# Patient Record
Sex: Female | Born: 1975
Health system: Southern US, Community
[De-identification: ages and names within clinical notes are randomized; demographics above are authoritative.]

## PROBLEM LIST (undated history)

## (undated) DIAGNOSIS — I10 Essential (primary) hypertension: Secondary | ICD-10-CM

## (undated) DIAGNOSIS — N83201 Unspecified ovarian cyst, right side: Secondary | ICD-10-CM

## (undated) DIAGNOSIS — R7989 Other specified abnormal findings of blood chemistry: Secondary | ICD-10-CM

## (undated) DIAGNOSIS — F32A Depression, unspecified: Secondary | ICD-10-CM

## (undated) DIAGNOSIS — F329 Major depressive disorder, single episode, unspecified: Secondary | ICD-10-CM

## (undated) DIAGNOSIS — K219 Gastro-esophageal reflux disease without esophagitis: Secondary | ICD-10-CM

## (undated) DIAGNOSIS — J40 Bronchitis, not specified as acute or chronic: Secondary | ICD-10-CM

## (undated) DIAGNOSIS — J45909 Unspecified asthma, uncomplicated: Secondary | ICD-10-CM

## (undated) DIAGNOSIS — N390 Urinary tract infection, site not specified: Secondary | ICD-10-CM

## (undated) HISTORY — PX: MENISCUS REPAIR: SHX5179

## (undated) HISTORY — DX: Urinary tract infection, site not specified: N39.0

## (undated) HISTORY — DX: Essential (primary) hypertension: I10

## (undated) HISTORY — DX: Major depressive disorder, single episode, unspecified: F32.9

## (undated) HISTORY — DX: Gastro-esophageal reflux disease without esophagitis: K21.9

## (undated) HISTORY — PX: ANTERIOR CRUCIATE LIGAMENT REPAIR: SHX115

## (undated) HISTORY — PX: ABDOMINAL HYSTERECTOMY: SHX81

## (undated) HISTORY — DX: Depression, unspecified: F32.A

## (undated) HISTORY — DX: Unspecified asthma, uncomplicated: J45.909

## (undated) HISTORY — DX: Other specified abnormal findings of blood chemistry: R79.89

## (undated) HISTORY — DX: Bronchitis, not specified as acute or chronic: J40

## (undated) HISTORY — DX: Unspecified ovarian cyst, right side: N83.201

---

## 1999-05-12 ENCOUNTER — Encounter: Payer: Self-pay | Admitting: *Deleted

## 1999-05-12 ENCOUNTER — Emergency Department (HOSPITAL_COMMUNITY): Admission: EM | Admit: 1999-05-12 | Discharge: 1999-05-12 | Payer: Self-pay | Admitting: *Deleted

## 2003-01-07 ENCOUNTER — Other Ambulatory Visit: Admission: RE | Admit: 2003-01-07 | Discharge: 2003-01-07 | Payer: Self-pay | Admitting: Obstetrics and Gynecology

## 2004-02-13 ENCOUNTER — Other Ambulatory Visit: Admission: RE | Admit: 2004-02-13 | Discharge: 2004-02-13 | Payer: Self-pay | Admitting: Obstetrics and Gynecology

## 2005-02-16 ENCOUNTER — Other Ambulatory Visit: Admission: RE | Admit: 2005-02-16 | Discharge: 2005-02-16 | Payer: Self-pay | Admitting: Obstetrics and Gynecology

## 2005-06-22 ENCOUNTER — Emergency Department (HOSPITAL_COMMUNITY): Admission: EM | Admit: 2005-06-22 | Discharge: 2005-06-22 | Payer: Self-pay | Admitting: Emergency Medicine

## 2005-08-07 ENCOUNTER — Emergency Department (HOSPITAL_COMMUNITY): Admission: EM | Admit: 2005-08-07 | Discharge: 2005-08-07 | Payer: Self-pay | Admitting: Emergency Medicine

## 2007-06-17 ENCOUNTER — Emergency Department (HOSPITAL_COMMUNITY): Admission: EM | Admit: 2007-06-17 | Discharge: 2007-06-18 | Payer: Self-pay | Admitting: Emergency Medicine

## 2011-08-12 LAB — URINE MICROSCOPIC-ADD ON

## 2011-08-12 LAB — URINE CULTURE: Colony Count: >=100000

## 2011-08-12 LAB — URINALYSIS, ROUTINE W REFLEX MICROSCOPIC
Bilirubin Urine: NEGATIVE
Glucose, UA: NEGATIVE
Ketones, ur: NEGATIVE
Nitrite: POSITIVE — AB
Protein, ur: 30 — AB
Specific Gravity, Urine: 1.014
Urobilinogen, UA: 2 — ABNORMAL HIGH
pH: 7

## 2011-08-12 LAB — PREGNANCY, URINE: Preg Test, Ur: NEGATIVE

## 2013-03-08 ENCOUNTER — Other Ambulatory Visit: Payer: Self-pay | Admitting: *Deleted

## 2013-03-08 ENCOUNTER — Encounter: Payer: Self-pay | Admitting: Gynecology

## 2013-03-08 ENCOUNTER — Ambulatory Visit (INDEPENDENT_AMBULATORY_CARE_PROVIDER_SITE_OTHER): Payer: BC Managed Care – PPO | Admitting: Gynecology

## 2013-03-08 VITALS — BP 110/74 | Ht 64.0 in | Wt 190.0 lb

## 2013-03-08 DIAGNOSIS — Z113 Encounter for screening for infections with a predominantly sexual mode of transmission: Secondary | ICD-10-CM

## 2013-03-08 DIAGNOSIS — N852 Hypertrophy of uterus: Secondary | ICD-10-CM

## 2013-03-08 DIAGNOSIS — Z0000 Encounter for general adult medical examination without abnormal findings: Secondary | ICD-10-CM

## 2013-03-08 DIAGNOSIS — E669 Obesity, unspecified: Secondary | ICD-10-CM | POA: Insufficient documentation

## 2013-03-08 DIAGNOSIS — IMO0001 Reserved for inherently not codable concepts without codable children: Secondary | ICD-10-CM

## 2013-03-08 DIAGNOSIS — Z124 Encounter for screening for malignant neoplasm of cervix: Secondary | ICD-10-CM

## 2013-03-08 DIAGNOSIS — Z309 Encounter for contraceptive management, unspecified: Secondary | ICD-10-CM

## 2013-03-08 DIAGNOSIS — Z01419 Encounter for gynecological examination (general) (routine) without abnormal findings: Secondary | ICD-10-CM

## 2013-03-08 LAB — POCT URINALYSIS DIPSTICK: pH, UA: 5

## 2013-03-08 NOTE — Patient Instructions (Signed)
High Protein Diet A high protein diet means that high protein foods are added to your diet. Getting more protein in the diet is important for a number of reasons. Protein helps the body to build tissue, muscle, and to repair damage. People who have had surgery, injuries such as broken bones, infections, and burns, or illnesses such as cancer, may need more protein in their diet.  SERVING SIZES Measuring foods and serving sizes helps to make sure you are getting the right amount of food. The list below tells how big or small some common serving sizes are.   1 oz.........4 stacked dice.  3 oz........Marland KitchenDeck of cards.  1 tsp.......Marland KitchenTip of little finger.  1 tbs......Marland KitchenMarland KitchenThumb.  2 tbs.......Marland KitchenGolf ball.   cup......Marland KitchenHalf of a fist.  1 cup.......Marland KitchenA fist. FOOD SOURCES OF PROTEIN Listed below are some food sources of protein and the amount of protein they contain. Your Registered Dietitian can calculate how many grams of protein you need for your medical condition. High protein foods can be added to the diet at mealtime or as snacks. Be sure to have at least 1 protein-containing food at each meal and snack to ensure adequate intake.   Levonorgestrel intrauterine device (IUD) What is this medicine? LEVONORGESTREL IUD (LEE voe nor jes trel) is a contraceptive (birth control) device. It is used to prevent pregnancy and to treat heavy bleeding that occurs during your period. It can be used for up to 5 years. This medicine may be used for other purposes; ask your health care provider or pharmacist if you have questions. What should I tell my health care provider before I take this medicine? They need to know if you have any of these conditions: -abnormal Pap smear -cancer of the breast, uterus, or cervix -diabetes -endometritis -genital or pelvic infection now or in the past -have more than one sexual partner or your partner has more than one partner -heart disease -history of an ectopic or tubal  pregnancy -immune system problems -IUD in place -liver disease or tumor -problems with blood clots or take blood-thinners -use intravenous drugs -uterus of unusual shape -vaginal bleeding that has not been explained -an unusual or allergic reaction to levonorgestrel, other hormones, silicone, or polyethylene, medicines, foods, dyes, or preservatives -pregnant or trying to get pregnant -breast-feeding How should I use this medicine? This device is placed inside the uterus by a health care professional. Talk to your pediatrician regarding the use of this medicine in children. Special care may be needed. Overdosage: If you think you have taken too much of this medicine contact a poison control center or emergency room at once. NOTE: This medicine is only for you. Do not share this medicine with others. What if I miss a dose? This does not apply. What may interact with this medicine? Do not take this medicine with any of the following medications: -amprenavir -bosentan -fosamprenavir This medicine may also interact with the following medications: -aprepitant -barbiturate medicines for inducing sleep or treating seizures -bexarotene -griseofulvin -medicines to treat seizures like carbamazepine, ethotoin, felbamate, oxcarbazepine, phenytoin, topiramate -modafinil -pioglitazone -rifabutin -rifampin -rifapentine -some medicines to treat HIV infection like atazanavir, indinavir, lopinavir, nelfinavir, tipranavir, ritonavir -St. John's wort -warfarin This list may not describe all possible interactions. Give your health care provider a list of all the medicines, herbs, non-prescription drugs, or dietary supplements you use. Also tell them if you smoke, drink alcohol, or use illegal drugs. Some items may interact with your medicine. What should I watch for while using this  medicine? Visit your doctor or health care professional for regular check ups. See your doctor if you or your partner  has sexual contact with others, becomes HIV positive, or gets a sexual transmitted disease. This product does not protect you against HIV infection (AIDS) or other sexually transmitted diseases. You can check the placement of the IUD yourself by reaching up to the top of your vagina with clean fingers to feel the threads. Do not pull on the threads. It is a good habit to check placement after each menstrual period. Call your doctor right away if you feel more of the IUD than just the threads or if you cannot feel the threads at all. The IUD may come out by itself. You may become pregnant if the device comes out. If you notice that the IUD has come out use a backup birth control method like condoms and call your health care provider. Using tampons will not change the position of the IUD and are okay to use during your period. What side effects may I notice from receiving this medicine? Side effects that you should report to your doctor or health care professional as soon as possible: -allergic reactions like skin rash, itching or hives, swelling of the face, lips, or tongue -fever, flu-like symptoms -genital sores -high blood pressure -no menstrual period for 6 weeks during use -pain, swelling, warmth in the leg -pelvic pain or tenderness -severe or sudden headache -signs of pregnancy -stomach cramping -sudden shortness of breath -trouble with balance, talking, or walking -unusual vaginal bleeding, discharge -yellowing of the eyes or skin Side effects that usually do not require medical attention (report to your doctor or health care professional if they continue or are bothersome): -acne -breast pain -change in sex drive or performance -changes in weight -cramping, dizziness, or faintness while the device is being inserted -headache -irregular menstrual bleeding within first 3 to 6 months of use -nausea This list may not describe all possible side effects. Call your doctor for medical  advice about side effects. You may report side effects to FDA at 1-800-FDA-1088. Where should I keep my medicine? This does not apply. NOTE: This sheet is a summary. It may not cover all possible information. If you have questions about this medicine, talk to your doctor, pharmacist, or health care provider.  2012, Elsevier/Gold Standard. (11/07/2008 6:39:08 PM) Meats and Meat Substitutes / Protein (g)  3 oz poultry (chicken, [place]) / 26 g  3 oz tuna, canned in water / 26 g  3 oz fish (cod) / 21 g  3 oz red meat (beef, pork) / 21 g  4 oz tofu / 9 g  1 egg / 6 g   cup egg substitute / 5 g  1 cup dried beans / 15 g  1 cup soy milk / 4 g Dairy / Protein (g)  1 cup milk (skim, 1%, 2%, whole) / 8 g   cup evaporated milk / 9 g  1 cup buttermilk / 8 g  1 cup low-fat plain yogurt / 11 g  1 cup regular plain yogurt / 9 g   cup cottage cheese / 14 g  1 oz cheddar cheese / 7 g Nuts / Protein (g)  2 tbs peanut butter / 8 g  1 oz peanuts / 7 g  2 tbs cashews / 5 g  2 tbs almonds / 5 g Document Released: 10/17/2005 Document Revised: 01/09/2012 Document Reviewed: 07/20/2007 [facility] Patient Information 2013 [place], [place].

## 2013-03-08 NOTE — Progress Notes (Signed)
37 y.o.  Single  African American female  G0. here for annual exam.  Current partner for 3y, mostly condom use, missed a cycle in March but resumed in April,  Pt reports negative UPT's.  Pt denies h/o STD, not interested in conception at this time, has used nuvaring, depo and oc without success.  No LMP recorded.          Sexually active: yes  The current method of family planning is condoms.    Exercising:  Starting to now  Last mammogram:  none Last pap smear: 3 years ago History of abnormal pap: none Smoking: no  Alcohol: 1-2 drinks/wk Last colonoscopy: none Last Bone Density:  none Last tetanus shot: 03/06/13 Last cholesterol check: not recently BSE:  yes  Hgb: 11.8               Urine: leuks 2    No health maintenance topics applied.  No family history on file.  There are no active problems to display for this patient.   No past medical history on file.  No past surgical history on file.  Allergies: Review of patient's allergies indicates not on file.  No current outpatient prescriptions on file.   No current facility-administered medications for this visit.    ROS: Pertinent items are noted in HPI.  Social Hx:    Exam:    There were no vitals taken for this visit.   Wt Readings from Last 3 Encounters:  No data found for Wt     Ht Readings from Last 3 Encounters:  No data found for Ht    General appearance: alert, cooperative and appears stated age Head: Normocephalic, without obvious abnormality, atraumatic Neck: no adenopathy, supple, symmetrical, trachea midline and thyroid not enlarged, symmetric, no tenderness/mass/nodules Lungs: clear to auscultation bilaterally Breasts: Inspection negative, No nipple retraction or dimpling, No nipple discharge or bleeding, No axillary or supraclavicular adenopathy, Normal to palpation without dominant masses Heart: regular rate and rhythm Abdomen: soft, non-tender; bowel sounds normal; no masses,  no  organomegaly Extremities: extremities normal, atraumatic, no cyanosis or edema Skin: Skin color, texture, turgor normal. No rashes or lesions Lymph nodes: Cervical, supraclavicular, and axillary nodes normal. No abnormal inguinal nodes palpated Neurologic: Grossly normal   Pelvic: External genitalia:  no lesions              Urethra:  normal appearing urethra with no masses, tenderness or lesions              Bartholins and Skenes: normal                 Vagina: normal appearing vagina with normal color and discharge, no lesions              Cervix: normal appearance              Pap taken: yes        Bimanual Exam:  Uterus:  uterus is nontender, anteflexed or anterior fibroid                                      Adnexa: normal adnexa in size, nontender and no masses                                      Rectovaginal: Confirms  Anus:  normal sphincter tone, no lesions  A: normal gyn exam Contraceptive management Obesity Questionable fibroid     P: PAP with HR HPV with GC/CTM, lipid panel counseled on breast self exam, diet and exercise U/s to evaluate uterus return annually or prn     An After Visit Summary was printed and given to the patient.

## 2013-03-09 LAB — LIPID PANEL
Cholesterol: 175 mg/dL (ref 0–200)
HDL: 60 mg/dL
LDL Cholesterol: 100 mg/dL — ABNORMAL HIGH (ref 0–99)
Total CHOL/HDL Ratio: 2.9 ratio
Triglycerides: 74 mg/dL
VLDL: 15 mg/dL (ref 0–40)

## 2013-03-11 ENCOUNTER — Telehealth: Payer: Self-pay | Admitting: *Deleted

## 2013-03-11 NOTE — Telephone Encounter (Signed)
Patient returning call to [facility]

## 2013-03-11 NOTE — Telephone Encounter (Signed)
Left Message To Call Back  

## 2013-03-11 NOTE — Telephone Encounter (Signed)
Message copied by Lorraine Lax on Mon Mar 11, 2013 11:11 AM ------      Message from: Douglass Rivers      Created: Mon Mar 11, 2013  8:10 AM       No UTI, chol ok ------

## 2013-03-11 NOTE — Telephone Encounter (Signed)
PATIETN NOTIFIED OF LAB RESULTS OF URINE AND CHOLESTEROL. PATIENT NOTIFIED THAT AFTER HER INSURANCE HAS BEEN NOTIFIED OF PRE CERT FOR ULTRA SOUND SHE WILL BE NOTIFIED OF OOP EXPENSE THEN ULTRA SOUND WILL BE SCHEDULED.

## 2013-03-12 ENCOUNTER — Telehealth: Payer: Self-pay | Admitting: Gynecology

## 2013-03-12 LAB — IPS PAP TEST WITH HPV

## 2013-03-12 NOTE — Telephone Encounter (Signed)
Noted  

## 2013-03-12 NOTE — Telephone Encounter (Signed)
LMTCB to discuss insurance benefits and to schedule PUS.  °

## 2013-03-26 ENCOUNTER — Ambulatory Visit (INDEPENDENT_AMBULATORY_CARE_PROVIDER_SITE_OTHER): Payer: BC Managed Care – PPO | Admitting: Gynecology

## 2013-03-26 ENCOUNTER — Ambulatory Visit (INDEPENDENT_AMBULATORY_CARE_PROVIDER_SITE_OTHER): Payer: BC Managed Care – PPO

## 2013-03-26 DIAGNOSIS — N852 Hypertrophy of uterus: Secondary | ICD-10-CM

## 2013-03-26 DIAGNOSIS — N8300 Follicular cyst of ovary, unspecified side: Secondary | ICD-10-CM

## 2013-03-26 DIAGNOSIS — N7013 Chronic salpingitis and oophoritis: Secondary | ICD-10-CM

## 2013-03-26 DIAGNOSIS — N7011 Chronic salpingitis: Secondary | ICD-10-CM

## 2013-03-26 DIAGNOSIS — N946 Dysmenorrhea, unspecified: Secondary | ICD-10-CM

## 2013-03-26 DIAGNOSIS — N731 Chronic parametritis and pelvic cellulitis: Secondary | ICD-10-CM

## 2013-03-26 DIAGNOSIS — N83209 Unspecified ovarian cyst, unspecified side: Secondary | ICD-10-CM

## 2013-03-26 DIAGNOSIS — Z309 Encounter for contraceptive management, unspecified: Secondary | ICD-10-CM

## 2013-03-26 MED ORDER — MISOPROSTOL 200 MCG PO TABS: ORAL_TABLET | ORAL | Status: DC

## 2013-03-26 NOTE — Progress Notes (Signed)
Pt here to evaluate questionable fibroid uterus with right sided tenderness on exam. U/S images reviewed with pt. Pt has questionable hydrosalpinx on right.  Pt denies history of STD or appendicitis.   We reviewed her options-she can do nothing, we can explore and either open the tube and lyse adhesions, or remove the tube.Pt reports discomfort on right is tolerable.  We stressed the increased risk of ectopic on the right, she is not interested in conception and desires having the IUD placed. We will see her back at her next menses, cytotec called in

## 2013-04-01 ENCOUNTER — Telehealth: Payer: Self-pay | Admitting: Gynecology

## 2013-04-01 NOTE — Telephone Encounter (Signed)
LMTCB to schedule during 1st 5 days of cycle.

## 2013-04-08 ENCOUNTER — Other Ambulatory Visit: Payer: Self-pay | Admitting: Orthopedic Surgery

## 2013-04-08 DIAGNOSIS — Z309 Encounter for contraceptive management, unspecified: Secondary | ICD-10-CM

## 2013-04-08 MED ORDER — MISOPROSTOL 200 MCG PO TABS: ORAL_TABLET | ORAL | Status: DC

## 2013-04-08 NOTE — Telephone Encounter (Signed)
Patient is ready to schedule iud appointment

## 2013-04-08 NOTE — Telephone Encounter (Signed)
Spoke with pt who started cycle yesterday. Sched IUD insertion 04-11-13 with TL. Instructed on Cytotec the night before and the morning of, and instructed to take ibuprofen 800 mg with food 1 hr prior to appt. Pt agreeable.

## 2013-04-11 ENCOUNTER — Ambulatory Visit (INDEPENDENT_AMBULATORY_CARE_PROVIDER_SITE_OTHER): Payer: BC Managed Care – PPO | Admitting: Gynecology

## 2013-04-11 ENCOUNTER — Encounter: Payer: Self-pay | Admitting: Gynecology

## 2013-04-11 VITALS — BP 112/68 | HR 72 | Resp 14 | Ht 64.0 in | Wt 190.0 lb

## 2013-04-11 DIAGNOSIS — Z992 Dependence on renal dialysis: Secondary | ICD-10-CM

## 2013-04-11 DIAGNOSIS — Z975 Presence of (intrauterine) contraceptive device: Secondary | ICD-10-CM

## 2013-04-11 HISTORY — PX: INTRAUTERINE DEVICE (IUD) INSERTION: SHX5877

## 2013-04-11 NOTE — Patient Instructions (Signed)
Intrauterine Device Insertion Care After Refer to this sheet in the next few weeks. These instructions provide you with information on caring for yourself after your procedure. Your caregiver may also give you more specific instructions. Your treatment has been planned according to current medical practices, but problems sometimes occur. Call your caregiver if you have any problems or questions after your procedure. HOME CARE INSTRUCTIONS   Only take over-the-counter or prescription medicines for pain, discomfort, or fever as directed by your caregiver. Do not use aspirin. This may increase bleeding.  Check your IUD to make sure it is in place before you resume sexual activity. You should be able to feel the strings. If you cannot feel the strings, something may be wrong. The IUD may have fallen out of the uterus, or the uterus may have been punctured (perforated) during placement. Also, if the strings are getting longer, it may mean that the IUD is being forced out of the uterus. You no longer have full protection from pregnancy if any of these problems occur.  You may resume sexual intercourse if you are not having problems with the IUD. The IUD is considered immediately effective.  You may resume normal activities.  Keep all follow-up appointments to be sure your IUD has remained in place. After the first exam, yearly exams are advised, unless you cannot feel the strings of your IUD.  Continue to check that the IUD is still in place by feeling for the strings after every menstrual period. SEEK MEDICAL CARE IF:   You have bleeding that is heavier or lasts longer than a normal menstrual cycle.  You have a fever.  You have increasing cramps or abdominal pain not relieved with medicine.  You have abdominal pain that does not seem to be related to the same area of earlier cramping and pain.  You are lightheaded, unusually weak, or faint.  You have abnormal vaginal discharge or  smells.  You have pain during sexual intercourse.  You cannot feel the IUD strings, or the IUD string has gotten longer.  You feel the IUD at the opening of the cervix in the vagina.  You think you are pregnant, or you miss your menstrual period.  The IUD string is hurting your sex partner. Document Released: 06/15/2011 Document Revised: 01/09/2012 Document Reviewed: 06/15/2011 [employer] Patient Information 2014 [employer].  

## 2013-04-11 NOTE — Procedures (Signed)
.  36 yrsSingleAfrican Americanfemale presents for  insertion of Mirena. Denies any vaginal symptoms or STD concerns.  LMP :04/07/2013  Patient read information regarding IUD insertion.  All questions addressed.    Healthy female,time, place and personnormal menses, no abnormal bleeding, pelvic pain or discharge, no breast pain or new or enlarging lumps on self exam Abdomen: soft, non-tender Groinno inguinal nodes palpated  Pelvic exam: Vulva;normal female genitalia  Vagina:normal vagina  Cervix:Non-tender, Negative CMT, no lesions or redness, nulliparous/parous os  Uterus:normal shape, position and consistency, anteverted    Procedure:  Speculum inserted into vagina. Cervix visualized and cleansed with betadine solution X 3. Tenaculum placed on cervix at 12 o'clock position(s).  Uterus sounded to 7 centimeters.  IUD removed from sterile packet and under sterile conditions inserted to fundus of uterus.  Introducer removed without difficulty.  IUD string trimmed to 2 centimeters.  Remainder string given to patient to feel for identification.  Tenaculum removed.  No bleeding noted.  Speculum removed.  Uterus palpated normal.  Patient tolerated procedure well.  A: Insertion of Mirena, Lot # [id number], Expiration date 12/15   P:  Instructions and warnings signs given.       IUD identification card given with IUD removal 03/2018       Return visit 1 month

## 2013-05-13 ENCOUNTER — Ambulatory Visit: Payer: BC Managed Care – PPO | Admitting: Gynecology

## 2013-05-17 ENCOUNTER — Ambulatory Visit (INDEPENDENT_AMBULATORY_CARE_PROVIDER_SITE_OTHER): Payer: BC Managed Care – PPO | Admitting: Gynecology

## 2013-05-17 VITALS — BP 128/76 | HR 74 | Resp 12 | Ht 64.0 in | Wt 192.2 lb

## 2013-05-17 DIAGNOSIS — Z975 Presence of (intrauterine) contraceptive device: Secondary | ICD-10-CM

## 2013-05-17 DIAGNOSIS — Z30431 Encounter for routine checking of intrauterine contraceptive device: Secondary | ICD-10-CM

## 2013-05-17 NOTE — Progress Notes (Signed)
Subjective:     Patient ID: Courtney Donaldson, female   DOB: [birth year], 37 y.o.   MRN: [id number]  HPI Comments: Pt here for IUD check, reports still bleeding, 2 pads/d, no clots pain is better.  No pain with sex.    Review of Systems  Genitourinary: Positive for vaginal bleeding. Negative for vaginal discharge, vaginal pain and pelvic pain.       Objective:   Physical Exam  Constitutional: She appears well-developed and well-nourished.  Genitourinary: Uterus normal. Uterus is not enlarged and not tender. Cervix exhibits no motion tenderness and no discharge. Right adnexum displays no mass. Left adnexum displays no mass.  normal exterrnal genitalia Light menstrum in vagina, no malodor Stings noted at cervix    Assessment:     IUD check     Plan:     Doing well  F/u prn

## 2013-06-25 ENCOUNTER — Ambulatory Visit (INDEPENDENT_AMBULATORY_CARE_PROVIDER_SITE_OTHER): Payer: BC Managed Care – PPO | Admitting: Gynecology

## 2013-06-25 ENCOUNTER — Encounter: Payer: Self-pay | Admitting: Gynecology

## 2013-06-25 VITALS — BP 116/76 | HR 64 | Resp 12 | Ht 63.0 in | Wt 190.0 lb

## 2013-06-25 DIAGNOSIS — Z113 Encounter for screening for infections with a predominantly sexual mode of transmission: Secondary | ICD-10-CM

## 2013-06-25 NOTE — Patient Instructions (Signed)
  Place sexually transmitted diseases screening patient instructions here.  

## 2013-06-25 NOTE — Progress Notes (Signed)
  Subjective:    Courtney Donaldson is a 37 y.o. female who presents for sexually transmitted disease check. Sexual history reviewed with the patient. STI Exposure: partner cheated with another woman, no condom use. Previous history of STI none. Current symptoms vaginal discharge: bloody and scant, pelvic pain: not present. Contraception: IUD Menstrual History: No LMP recorded.    The following portions of the patient's history were reviewed and updated as appropriate: allergies, current medications, past family history, past medical history, past social history, past surgical history and problem list.  Review of Systems Pertinent items are noted in HPI.    Objective:    BP 116/76  Pulse 64  Resp 12  Ht 5\' 3"  (1.6 m)  Wt 190 lb (86.183 kg)  BMI 33.67 kg/m2 General:   alert, cooperative and appears stated age  Lymph Nodes:   Inguinal adenopathy: negative  Pelvis:  External genitalia: normal general appearance Vaginal: normal mucosa without prolapse or lesions Cervix: iud strings palpated, scant mucoid blood at os Adnexa: normal bimanual exam Uterus: irregular enlargement  Cultures:  GC and Chlamydia genprobes, HIV antibody blood test and HIV, RPR     Assessment:    Possible STD exposure    Plan:    Discussed safe sexual practice in detail See orders for STD cultures and assays Will call pt with results RTC PRN

## 2013-06-26 LAB — STD PANEL
HIV: NONREACTIVE
Hepatitis B Surface Ag: NEGATIVE

## 2013-06-27 ENCOUNTER — Telehealth: Payer: Self-pay | Admitting: Gynecology

## 2013-09-05 ENCOUNTER — Other Ambulatory Visit: Payer: Self-pay

## 2013-10-31 DIAGNOSIS — J40 Bronchitis, not specified as acute or chronic: Secondary | ICD-10-CM

## 2013-10-31 DIAGNOSIS — N83201 Unspecified ovarian cyst, right side: Secondary | ICD-10-CM

## 2013-10-31 HISTORY — DX: Bronchitis, not specified as acute or chronic: J40

## 2013-10-31 HISTORY — DX: Unspecified ovarian cyst, right side: N83.201

## 2014-03-10 ENCOUNTER — Ambulatory Visit: Payer: BC Managed Care – PPO | Admitting: Gynecology

## 2014-03-14 ENCOUNTER — Ambulatory Visit (INDEPENDENT_AMBULATORY_CARE_PROVIDER_SITE_OTHER): Payer: BC Managed Care – PPO | Admitting: Gynecology

## 2014-03-14 ENCOUNTER — Encounter: Payer: Self-pay | Admitting: Gynecology

## 2014-03-14 VITALS — BP 110/80 | HR 68 | Resp 16 | Ht 64.0 in | Wt 201.0 lb

## 2014-03-14 DIAGNOSIS — Z01419 Encounter for gynecological examination (general) (routine) without abnormal findings: Secondary | ICD-10-CM

## 2014-03-14 DIAGNOSIS — Z975 Presence of (intrauterine) contraceptive device: Secondary | ICD-10-CM

## 2014-03-14 DIAGNOSIS — Z0000 Encounter for general adult medical examination without abnormal findings: Secondary | ICD-10-CM

## 2014-03-14 LAB — POCT URINALYSIS DIPSTICK
Bilirubin, UA: NEGATIVE
GLUCOSE UA: NEGATIVE
Ketones, UA: NEGATIVE
Leukocytes, UA: NEGATIVE
NITRITE UA: NEGATIVE
PROTEIN UA: NEGATIVE
RBC UA: NEGATIVE
UROBILINOGEN UA: NEGATIVE
pH, UA: 5

## 2014-03-14 LAB — HEMOGLOBIN, FINGERSTICK: Hemoglobin, fingerstick: 13.2 g/dL (ref 12.0–16.0)

## 2014-03-14 NOTE — Progress Notes (Signed)
38 y.o. Single African American female   G0P0000 here for annual exam. Pt is currently sexually active.  Pt has IUD placed 7/14, cycles are irregular and light, still with a lot of spotting mostly as flow.  No dyspareunia.    Patient's last menstrual period was 03/02/2014.          Sexually active: yes  The current method of family planning is IUD.    Exercising: no  The patient does not participate in regular exercise at present. Last pap: 03/08/13 Neg. HR HPV: Neg Alcohol: Yes, Occasionally  Tobacco: No BSE: yes, once a month. Hem: 13.2 UA: Clear   Health Maintenance  Topic Date Due  . Tetanus/tdap  05/17/1996  . Influenza Vaccine  05/31/2014  . Pap Smear  03/08/2016    Family History  Problem Relation Age of Onset  . Diabetes Mother   . Pancreatic cancer Paternal Aunt 53  . Diabetes Maternal Grandmother   . Diabetes Maternal Grandfather   . Cancer Paternal Grandmother     breast    Patient Active Problem List   Diagnosis Date Noted  . IUD (intrauterine device) in place 05/17/2013  . Morbid obesity 03/08/2013    Past Medical History  Diagnosis Date  . Bronchitis 10/2013    History reviewed. No pertinent past surgical history.  Allergies: Amoxicillin  Current Outpatient Prescriptions  Medication Sig Dispense Refill  . levonorgestrel (MIRENA) 20 MCG/24HR IUD 1 each by Intrauterine route once.       No current facility-administered medications for this visit.    ROS: Pertinent items are noted in HPI.  Exam:    BP 110/80  Pulse 68  Resp 16  Ht 5\' 4"  (1.626 m)  Wt 201 lb (91.173 kg)  BMI 34.48 kg/m2  LMP 03/02/2014 Weight change: @WEIGHTCHANGE @ Last 3 height recordings:  Ht Readings from Last 3 Encounters:  03/14/14 5\' 4"  (1.626 m)  06/25/13 5\' 3"  (1.6 m)  05/17/13 5\' 4"  (1.626 m)   General appearance: alert, cooperative and appears stated age Head: Normocephalic, without obvious abnormality, atraumatic Neck: no adenopathy, no carotid bruit, no JVD,  supple, symmetrical, trachea midline and thyroid not enlarged, symmetric, no tenderness/mass/nodules Lungs: clear to auscultation bilaterally Breasts: normal appearance, no masses or tenderness Heart: regular rate and rhythm, S1, S2 normal, no murmur, click, rub or gallop Abdomen: soft, non-tender; bowel sounds normal; no masses,  no organomegaly Extremities: extremities normal, atraumatic, no cyanosis or edema Skin: Skin color, texture, turgor normal. No rashes or lesions Lymph nodes: Cervical, supraclavicular, and axillary nodes normal. no inguinal nodes palpated Neurologic: Grossly normal   Pelvic: External genitalia:  no lesions              Urethra: normal appearing urethra with no masses, tenderness or lesions              Bartholins and Skenes: Bartholin's, Urethra, Skene's normal                 Vagina: normal appearing vagina with normal color and discharge, no lesions              Cervix: normal appearance and IUD strings no seen or palpated              Pap taken: no        Bimanual Exam:  Uterus:  uterus is normal size, shape, consistency and nontender  Adnexa:    normal adnexa in size, nontender and no masses                                      Rectovaginal: Confirms                                      Anus:  normal sphincter tone, no lesions  A: well woman IUD in place-strings not seen Weight gain-10#     P: counseled on breast self exam, adequate intake of calcium and vitamin D, diet and exercise PUS to confirm IUD placement return annually or prn   An After Visit Summary was printed and given to the patient.

## 2014-03-18 ENCOUNTER — Telehealth: Payer: Self-pay | Admitting: Gynecology

## 2014-03-18 NOTE — Telephone Encounter (Signed)
Spoke with patient. Advised that per benefit quote received, she will be responsible for $5 copay for PUS. Patient agreeable. Scheduled PUS. Advised patient of 72 hour cancellation policy and $354 cancellation fee. Patient agreeable.

## 2014-03-25 ENCOUNTER — Ambulatory Visit (INDEPENDENT_AMBULATORY_CARE_PROVIDER_SITE_OTHER): Payer: BC Managed Care – PPO

## 2014-03-25 ENCOUNTER — Ambulatory Visit (INDEPENDENT_AMBULATORY_CARE_PROVIDER_SITE_OTHER): Payer: BC Managed Care – PPO | Admitting: Gynecology

## 2014-03-25 VITALS — BP 108/70 | HR 68 | Resp 16 | Ht 64.0 in | Wt 199.0 lb

## 2014-03-25 DIAGNOSIS — N83209 Unspecified ovarian cyst, unspecified side: Secondary | ICD-10-CM

## 2014-03-25 DIAGNOSIS — N83202 Unspecified ovarian cyst, left side: Secondary | ICD-10-CM

## 2014-03-25 DIAGNOSIS — Z975 Presence of (intrauterine) contraceptive device: Secondary | ICD-10-CM

## 2014-03-25 NOTE — Progress Notes (Signed)
      Pt here for PUS for location of IUD, strings not seen. Images reviewed with Pt. IUD properly placed in cavity. Right adnexa with calcification (new) noted and serpentine cyst seen previously. Left adnexa: thin walled avascular cyst No free fluid   Pt assured re IUD. Images compared with last year, calcification not noted, ddx dermoid vs clot, will repeat PUS in 1y if no symptoms.  Briefly reviewed dermoid cysts Left ovarian cyst-new, probably functional questions addressed 44m spent counseling>50% face to face

## 2015-03-16 ENCOUNTER — Ambulatory Visit: Payer: BC Managed Care – PPO | Admitting: Gynecology

## 2015-03-17 ENCOUNTER — Ambulatory Visit: Payer: BC Managed Care – PPO | Admitting: Nurse Practitioner

## 2016-02-22 ENCOUNTER — Encounter: Payer: Self-pay | Admitting: Nurse Practitioner

## 2016-02-22 ENCOUNTER — Ambulatory Visit (INDEPENDENT_AMBULATORY_CARE_PROVIDER_SITE_OTHER): Payer: BLUE CROSS/BLUE SHIELD | Admitting: Nurse Practitioner

## 2016-02-22 VITALS — BP 128/80 | HR 60 | Resp 18 | Ht 63.5 in | Wt 194.0 lb

## 2016-02-22 DIAGNOSIS — Z0000 Encounter for general adult medical examination without abnormal findings: Secondary | ICD-10-CM

## 2016-02-22 DIAGNOSIS — T8389XA Other specified complication of genitourinary prosthetic devices, implants and grafts, initial encounter: Secondary | ICD-10-CM | POA: Diagnosis not present

## 2016-02-22 DIAGNOSIS — Z01419 Encounter for gynecological examination (general) (routine) without abnormal findings: Secondary | ICD-10-CM | POA: Diagnosis not present

## 2016-02-22 DIAGNOSIS — N920 Excessive and frequent menstruation with regular cycle: Secondary | ICD-10-CM | POA: Diagnosis not present

## 2016-02-22 LAB — POCT URINALYSIS DIPSTICK
BILIRUBIN UA: NEGATIVE
Blood, UA: NEGATIVE
Glucose, UA: NEGATIVE
Ketones, UA: NEGATIVE
LEUKOCYTES UA: NEGATIVE
NITRITE UA: NEGATIVE
PH UA: 5
Protein, UA: NEGATIVE
Urobilinogen, UA: NEGATIVE

## 2016-02-22 NOTE — Patient Instructions (Addendum)

## 2016-02-22 NOTE — Progress Notes (Signed)
39 y.o. G0P0000 Single African American Fe here for annual exam.  She has had Mirena IUD since 04/11/13.  Since then still has menses.  Overall lighter but still heavier for first 2-3 days, then moderate then light and spotting.  Now cycles last 14+ days.  Prior to IUD heavy and lasted for 7 days.  Last  PUS 03/26/13.  No fibroids seen but ? Hydro salpinx on the right.  Dating same partner for 6 yrs.  She works now for [employer].  Patient's last menstrual period was 02/08/2016.          Sexually active: Yes.    The current method of family planning is IUD Mirena inserted 04/11/13  Exercising: No.  The patient does not participate in regular exercise at present. Smoker:  no  Health Maintenance: Pap:  03/08/13 Neg. HR HPV:neg TDaP:  UTD per pt HIV: 05/2013 Neg Labs: here today UA: Negative   reports that she has never smoked. She has never used smokeless tobacco. She reports that she drinks about 0.5 - 1.0 oz of alcohol per week. She reports that she does not use illicit drugs.  Past Medical History  Diagnosis Date  . Bronchitis 10/2013    Past Surgical History  Procedure Laterality Date  . Intrauterine device (iud) insertion  04/11/13    Mirena    Current Outpatient Prescriptions  Medication Sig Dispense Refill  . levonorgestrel (MIRENA) 20 MCG/24HR IUD 1 each by Intrauterine route once. Inserted 04/11/13     No current facility-administered medications for this visit.    Family History  Problem Relation Age of Onset  . Diabetes Mother   . Pancreatic cancer Paternal Aunt 55  . Diabetes Maternal Grandmother   . Diabetes Maternal Grandfather   . Breast cancer Paternal Grandmother 38    postmenopausal  . Hypertension Father   . Kidney failure  55    ROS:  Pertinent items are noted in HPI.  Otherwise, a comprehensive ROS was negative.  Exam:   BP 128/80 mmHg  Pulse 60  Resp 18  Ht 5' 3.5" (1.613 m)  Wt 194 lb (87.998 kg)  BMI 33.82 kg/m2  LMP 02/08/2016 Height: 5' 3.5" (161.3  cm) Ht Readings from Last 3 Encounters:  02/22/16 5' 3.5" (1.613 m)  03/25/14 5\' 4"  (1.626 m)  03/14/14 5\' 4"  (1.626 m)    General appearance: alert, cooperative and appears stated age Head: Normocephalic, without obvious abnormality, atraumatic Neck: no adenopathy, supple, symmetrical, trachea midline and thyroid normal to inspection and palpation Lungs: clear to auscultation bilaterally Breasts: normal appearance, no masses or tenderness Heart: regular rate and rhythm Abdomen: soft, non-tender; no masses,  no organomegaly Extremities: extremities normal, atraumatic, no cyanosis or edema Skin: Skin color, texture, turgor normal. No rashes or lesions Lymph nodes: Cervical, supraclavicular, and axillary nodes normal. No abnormal inguinal nodes palpated Neurologic: Grossly normal   Pelvic: External genitalia:  no lesions              Urethra:  normal appearing urethra with no masses, tenderness or lesions              Bartholin's and Skene's: normal                 Vagina: normal appearing vagina with normal color and discharge, no lesions              Cervix: anteverted  IUD strings are visible  Pap taken: Yes.   Bimanual Exam:  Uterus:  normal size, contour, position, consistency, mobility, non-tender              Adnexa: no mass, fullness, tenderness               Rectovaginal: Confirms               Anus:  normal sphincter tone, no lesions  Chaperone present: yes  A:  Well Woman with normal exam  History of menorrhagia  ? Right Hydrosalpinx  Mirena IUD for contraception 04/11/2013  Continues to have menses - now at 14 days since IUD  [facility]: DM, kidney failure, Breast cancer and pancreatic cancer   P:   Reviewed health and wellness pertinent to exam  Pap smear as above  Discussed with Dr. Talbert Nan - will have her return for PUS to check status of IUD string and endo lining.  counseled on breast self exam, STD prevention, adequate intake of calcium and vitamin D,  diet and exercise, Kegel's exercises return annually or prn  An After Visit Summary was printed and given to the patient.

## 2016-02-23 ENCOUNTER — Other Ambulatory Visit: Payer: Self-pay | Admitting: Nurse Practitioner

## 2016-02-23 DIAGNOSIS — E7800 Pure hypercholesterolemia, unspecified: Secondary | ICD-10-CM

## 2016-02-23 DIAGNOSIS — E559 Vitamin D deficiency, unspecified: Secondary | ICD-10-CM

## 2016-02-23 LAB — COMPREHENSIVE METABOLIC PANEL
ALBUMIN: 4.3 g/dL (ref 3.6–5.1)
ALK PHOS: 60 U/L (ref 33–115)
ALT: 10 U/L (ref 6–29)
AST: 15 U/L (ref 10–30)
BILIRUBIN TOTAL: 0.4 mg/dL (ref 0.2–1.2)
BUN: 11 mg/dL (ref 7–25)
CALCIUM: 9.3 mg/dL (ref 8.6–10.2)
CO2: 21 mmol/L (ref 20–31)
CREATININE: 0.66 mg/dL (ref 0.50–1.10)
Chloride: 108 mmol/L (ref 98–110)
Glucose, Bld: 95 mg/dL (ref 65–99)
Potassium: 3.9 mmol/L (ref 3.5–5.3)
SODIUM: 132 mmol/L — AB (ref 135–146)
Total Protein: 7.1 g/dL (ref 6.1–8.1)

## 2016-02-23 LAB — LIPID PANEL
CHOLESTEROL: 203 mg/dL — AB (ref 125–200)
HDL: 51 mg/dL (ref >=46)
LDL Cholesterol: 118 mg/dL (ref <130)
TRIGLYCERIDES: 168 mg/dL — AB (ref <150)
Total CHOL/HDL Ratio: 4 Ratio (ref <=5.0)
VLDL: 34 mg/dL — ABNORMAL HIGH (ref <30)

## 2016-02-23 LAB — HEMOGLOBIN, FINGERSTICK: Hemoglobin, fingerstick: 12.7 g/dL (ref 12.0–16.0)

## 2016-02-23 LAB — CBC
HEMATOCRIT: 39.2 % (ref 35.0–45.0)
HEMOGLOBIN: 12.5 g/dL (ref 11.7–15.5)
MCH: 29.4 pg (ref 27.0–33.0)
MCHC: 31.9 g/dL — AB (ref 32.0–36.0)
MCV: 92.2 fL (ref 80.0–100.0)
MPV: 8.7 fL (ref 7.5–12.5)
Platelets: 310 10*3/uL (ref 140–400)
RBC: 4.25 MIL/uL (ref 3.80–5.10)
RDW: 13.4 % (ref 11.0–15.0)
WBC: 4.5 10*3/uL (ref 3.8–10.8)

## 2016-02-23 LAB — STD PANEL
HIV: NONREACTIVE
Hepatitis B Surface Ag: NEGATIVE

## 2016-02-23 LAB — HEMOGLOBIN A1C
Hgb A1c MFr Bld: 5.5 % (ref <5.7)
Mean Plasma Glucose: 111 mg/dL

## 2016-02-23 LAB — TSH: TSH: 1.49 mIU/L

## 2016-02-23 LAB — VITAMIN D 25 HYDROXY (VIT D DEFICIENCY, FRACTURES): Vit D, 25-Hydroxy: 11 ng/mL — ABNORMAL LOW (ref 30–100)

## 2016-02-23 MED ORDER — VITAMIN D (ERGOCALCIFEROL) 1.25 MG (50000 UNIT) PO CAPS: 50000.0000 [IU] | ORAL_CAPSULE | ORAL | Status: DC

## 2016-02-24 ENCOUNTER — Telehealth: Payer: Self-pay | Admitting: Obstetrics and Gynecology

## 2016-02-24 NOTE — Telephone Encounter (Signed)
Spoke with pt regarding benefit for ultrasound. Patient understood and agreeable. Patient ready to schedule. Patient scheduled 03/03/16 with Dr Quincy Simmonds. Pt aware of arrival date and time. Pt aware of 72 hours cancellation policy with [id number] fee. No further questions. Ok to close

## 2016-02-25 LAB — IPS N GONORRHOEA AND CHLAMYDIA BY PCR

## 2016-02-26 LAB — IPS PAP TEST WITH HPV

## 2016-02-26 NOTE — Progress Notes (Signed)
Encounter reviewed by Dr. Sofya Moustafa Amundson C. Silva.  

## 2016-03-03 ENCOUNTER — Encounter: Payer: Self-pay | Admitting: Obstetrics and Gynecology

## 2016-03-03 ENCOUNTER — Ambulatory Visit (INDEPENDENT_AMBULATORY_CARE_PROVIDER_SITE_OTHER): Payer: BLUE CROSS/BLUE SHIELD

## 2016-03-03 ENCOUNTER — Ambulatory Visit (INDEPENDENT_AMBULATORY_CARE_PROVIDER_SITE_OTHER): Payer: BLUE CROSS/BLUE SHIELD | Admitting: Obstetrics and Gynecology

## 2016-03-03 VITALS — BP 124/62 | HR 56 | Ht 63.5 in | Wt 196.0 lb

## 2016-03-03 DIAGNOSIS — N83201 Unspecified ovarian cyst, right side: Secondary | ICD-10-CM

## 2016-03-03 DIAGNOSIS — T8389XA Other specified complication of genitourinary prosthetic devices, implants and grafts, initial encounter: Secondary | ICD-10-CM

## 2016-03-03 DIAGNOSIS — A499 Bacterial infection, unspecified: Secondary | ICD-10-CM

## 2016-03-03 DIAGNOSIS — E871 Hypo-osmolality and hyponatremia: Secondary | ICD-10-CM

## 2016-03-03 DIAGNOSIS — N760 Acute vaginitis: Secondary | ICD-10-CM

## 2016-03-03 DIAGNOSIS — N83202 Unspecified ovarian cyst, left side: Secondary | ICD-10-CM

## 2016-03-03 DIAGNOSIS — N920 Excessive and frequent menstruation with regular cycle: Secondary | ICD-10-CM

## 2016-03-03 DIAGNOSIS — B9689 Other specified bacterial agents as the cause of diseases classified elsewhere: Secondary | ICD-10-CM

## 2016-03-03 LAB — BASIC METABOLIC PANEL
BUN: 18 mg/dL (ref 7–25)
CALCIUM: 9.5 mg/dL (ref 8.6–10.2)
CO2: 24 mmol/L (ref 20–31)
Chloride: 104 mmol/L (ref 98–110)
Creat: 0.7 mg/dL (ref 0.50–1.10)
Glucose, Bld: 74 mg/dL (ref 65–99)
Potassium: 4.5 mmol/L (ref 3.5–5.3)
Sodium: 136 mmol/L (ref 135–146)

## 2016-03-03 MED ORDER — NORETHIN ACE-ETH ESTRAD-FE 1-20 MG-MCG PO TABS: 1.0000 | ORAL_TABLET | Freq: Every day | ORAL | Status: DC

## 2016-03-03 MED ORDER — METRONIDAZOLE 500 MG PO TABS: 500.0000 mg | ORAL_TABLET | Freq: Two times a day (BID) | ORAL | Status: DC

## 2016-03-03 NOTE — Progress Notes (Signed)
Subjective  40 y.o. G0P0000 Single Caucasian female here for pelvic ultrasound for prolonged menses with Mirena IUD.  Has Mirena IUD since July 2014. Menses are light but last a long time, meaning 2 - 3 weeks.  This occurring for over one year. No pain.  Likes the IUD but not the bleeding.   GC/CT negative April 2017.  No HTN. No Hx DVT, PE. No prior contraindication to take oral contraceptives.  Having labs today to recheck a low sodium level at routine labs done with annual exam.   Patient needs tx for bacterial vaginosis.  No Rx received yet.  Patient's last menstrual period was 02/08/2016.  Objective  Pelvic ultrasound images and report reviewed with patient.  Uterus - no masses. EMS - 3.67 mm.  IUD in place. Ovaries - right ovary with cystic mass with calcifications and low level echoes - 39 x 26 x 29 mm (was 21 x 21 x 16 in 2015).  Consistent with dermoid. Left ovary with cyst 39 x 31 x 35 mm. Has a thin wall.  Avascular.  (was 41 x 31 x 36 mm). Free fluid - no     Assessment  Complex right ovarian cyst, enlarging.  Most likely a dermoid.  Left ovarian cyst with thin wall.  Prolonged bleeding with Mirena.   Bacterial vaginosis.  Hyponatremia.  Plan  Will proceed forward with laparoscopic right ovarian cystectomy, right salpingo-oophorectomy, left ovarian cyst drainage, and collection of pelvic washings. Risks of laparoscopy include but are not limited to bleeding, infection, damage to surrounding organs, reaction to anesthesia, pneumonia, DVT, PE, death, hernia formation, need for further surgery or care. OK for course of combined oral contraceptives, Junel 1/20, for 3 months to regulate menses. Discussed risks of DVT, PE, MI and warning signs. If bleeding does not resolve, consider IUD removal/hysteroscopy/dilation and curettage. Will recheck sodium level today.  Rx for Flagyl 500 mg po bid for 7 days. ETOH precautions given.    ___25____ minutes face to face  time of which over 50% was spent in counseling.   After visit summary to patient.

## 2016-04-20 ENCOUNTER — Telehealth: Payer: Self-pay | Admitting: *Deleted

## 2016-04-20 DIAGNOSIS — N83201 Unspecified ovarian cyst, right side: Secondary | ICD-10-CM

## 2016-04-20 NOTE — Telephone Encounter (Signed)
Patient has not called back to business office regarding surgery scheuling. Call to patient. States she is simply not able to schedule surgery at this time. Advised that follow-up of cyst would still be needed. Advised will check with Dr Quincy Simmonds on when to recheck cyst. With questioning, patient reports some intermittent cramping and pain but does not have it now so difficult to explain location. States was started on birth control pills and this continues to cause BTB.  Advised recommend proceed with office visit for Dr Quincy Simmonds for reassessment and new plan. Scheduled for ultrasound and office visit tomorrow. Advised Dr Quincy Simmonds will review call and if has different/chnage in recommendations, will call her back.   Please advise.

## 2016-04-20 NOTE — Telephone Encounter (Signed)
Order entered. Encounter closed.

## 2016-04-20 NOTE — Telephone Encounter (Signed)
I agree with reassessment and ultrasound visit with me for tomorrow. Thank you!

## 2016-04-21 ENCOUNTER — Ambulatory Visit (INDEPENDENT_AMBULATORY_CARE_PROVIDER_SITE_OTHER): Payer: BLUE CROSS/BLUE SHIELD | Admitting: Obstetrics and Gynecology

## 2016-04-21 ENCOUNTER — Ambulatory Visit (INDEPENDENT_AMBULATORY_CARE_PROVIDER_SITE_OTHER): Payer: BLUE CROSS/BLUE SHIELD

## 2016-04-21 ENCOUNTER — Encounter: Payer: Self-pay | Admitting: Obstetrics and Gynecology

## 2016-04-21 VITALS — BP 122/76 | HR 60 | Ht 63.5 in | Wt 191.0 lb

## 2016-04-21 DIAGNOSIS — N83202 Unspecified ovarian cyst, left side: Secondary | ICD-10-CM | POA: Diagnosis not present

## 2016-04-21 DIAGNOSIS — N939 Abnormal uterine and vaginal bleeding, unspecified: Secondary | ICD-10-CM | POA: Diagnosis not present

## 2016-04-21 DIAGNOSIS — N83201 Unspecified ovarian cyst, right side: Secondary | ICD-10-CM

## 2016-04-21 MED ORDER — NORETHIN ACE-ETH ESTRAD-FE 1-20 MG-MCG PO TABS: 1.0000 | ORAL_TABLET | Freq: Every day | ORAL | Status: DC

## 2016-04-21 NOTE — Progress Notes (Signed)
GYNECOLOGY  VISIT   HPI: 40 y.o.   Single  African American  female   G0P0000 with No LMP recorded.   here for   Pelvic ultrasound to check bilateral ovarian cysts.  States she is not able to proceed with surgical care for the possible right dermoid cyst.  Has a Mirena IUD and is taking a course of combined OCPs to help with break through bleeding.   Started Junel in May. Still bleeding every day.   Missed a couple of pills Memorial Day weekend and then doubled up on them.  Cramping pain every now and then.  Overall happy with the IUD.   02/22/16 - neg GC/CT. No new partners.  Prior pelvic ultrasound 03/03/16:  Uterus - no masses. EMS - 3.67 mm. IUD in place. Ovaries - right ovary with cystic mass with calcifications and low level echoes - 39 x 26 x 29 mm (was 21 x 21 x 16 in 2015). Consistent with dermoid. Left ovary with cyst 39 x 31 x 35 mm. Has a thin wall. Avascular. (was 41 x 31 x 36 mm). Free fluid - no  GYNECOLOGIC HISTORY: No LMP recorded. Contraception:   Mirena IUD. Place in 2014.   Last pap smear:   02/22/16 - neg and neg HR HPV.        OB History    Gravida Para Term Preterm AB TAB SAB Ectopic Multiple Living   0 0 0 0 0 0 0 0 0 0          Patient Active Problem List   Diagnosis Date Noted  . IUD (intrauterine device) in place 05/17/2013  . Morbid obesity ([place]) 03/08/2013    Past Medical History  Diagnosis Date  . Bronchitis 10/2013  . Right ovarian cyst 2015    Calcified cyst - possible dermoid.  Needs yearly ultrasound.    Past Surgical History  Procedure Laterality Date  . Intrauterine device (iud) insertion  04/11/13    Mirena    Current Outpatient Prescriptions  Medication Sig Dispense Refill  . levonorgestrel (MIRENA) 20 MCG/24HR IUD 1 each by Intrauterine route once. Inserted 04/11/13    . metroNIDAZOLE (FLAGYL) 500 MG tablet Take 1 tablet (500 mg total) by mouth 2 (two) times daily. 14 tablet 0  . norethindrone-ethinyl estradiol (JUNEL  FE,GILDESS FE,LOESTRIN FE) 1-20 MG-MCG tablet Take 1 tablet by mouth daily. Take pills continuously, one by mouth daily. 3 Package 0  . Vitamin D, Ergocalciferol, (DRISDOL) 50000 units CAPS capsule Take 1 capsule (50,000 Units total) by mouth every 7 (seven) days. 30 capsule 0   No current facility-administered medications for this visit.     ALLERGIES: Amoxicillin  Family History  Problem Relation Age of Onset  . Diabetes Mother   . Pancreatic cancer Paternal Aunt 31  . Diabetes Maternal Grandmother   . Diabetes Maternal Grandfather   . Breast cancer Paternal Grandmother 31    postmenopausal  . Hypertension Father   . Kidney failure  79    Social History   Social History  . Marital Status: Single    Spouse Name: N/A  . Number of Children: N/A  . Years of Education: N/A   Occupational History  . Not on file.   Social History Main Topics  . Smoking status: Never Smoker   . Smokeless tobacco: Never Used  . Alcohol Use: 0.6 - 1.2 oz/week    1-2 Standard drinks or equivalent per week  . Drug Use: No  . Sexual Activity:  Partners: Male    Birth Control/ Protection: Condom, IUD     Comment: Mirena Inserted 04/11/13   Other Topics Concern  . Not on file   Social History Narrative    ROS:  Pertinent items are noted in HPI.  PHYSICAL EXAMINATION:    BP 122/76 mmHg  Pulse 60  Ht 5' 3.5" (1.613 m)  Wt 191 lb (86.637 kg)  BMI 33.30 kg/m2  LMP     General appearance: alert, cooperative and appears stated age   Pelvic ultrasound today: No uterine masses.  EMS 3.23 mm. Right ovary with 25 x 22 mm cyst with calcifications. Avascular.  Dermoid vs. Other etiology.  Left ovary with 14 mm simple cyst.  Avascular.  No free fluid.  ASSESSMENT  Bilateral ovarian cysts.  Right ovarian cyst appear to be quite stable over time since 2014 on chart review.  There is perhaps mild enlargement of the cyst.  Left ovarian cyst is now much smaller. Mirena IUD patient.  On  combined oral contraceptives to regulate bleeding.  Missed OCPs so still having irregular spotting.   PLAN  I again discussed dermoid cysts and ovarian cystectomy for care.  She understands that there is risk of malignancy more likely in menopausal women with this type of cyst. As it has been quite stable, I think it is ok to do a yearly ultrasound as a recheck on the cyst.  If pain occurs, will re-evaluate sooner.  Continue combined oral contraceptives for another 2 months.  If spotting persists, needs to return.    An After Visit Summary was printed and given to the patient.  _15___ minutes face to face time of which over 50% was spent in counseling.

## 2016-07-19 ENCOUNTER — Other Ambulatory Visit: Payer: Self-pay | Admitting: Obstetrics and Gynecology

## 2016-07-20 NOTE — Telephone Encounter (Signed)
Medication refill request: OCP Last AEX:  02-22-16  Next AEX: 02-27-17 Last MMG (if hormonal medication request): N/A Refill authorized: please advise   Sending to BS since PG is out of the office

## 2016-10-05 ENCOUNTER — Ambulatory Visit (INDEPENDENT_AMBULATORY_CARE_PROVIDER_SITE_OTHER): Payer: BLUE CROSS/BLUE SHIELD | Admitting: Nurse Practitioner

## 2016-10-05 ENCOUNTER — Encounter: Payer: Self-pay | Admitting: Nurse Practitioner

## 2016-10-05 VITALS — BP 112/70 | HR 68 | Ht 63.5 in | Wt 196.0 lb

## 2016-10-05 DIAGNOSIS — N760 Acute vaginitis: Secondary | ICD-10-CM

## 2016-10-05 DIAGNOSIS — N939 Abnormal uterine and vaginal bleeding, unspecified: Secondary | ICD-10-CM

## 2016-10-05 NOTE — Patient Instructions (Signed)
Will call with test results  Call us if BTB continues

## 2016-10-05 NOTE — Progress Notes (Signed)
40 y.o. Single African American female G0P0000 here with complaint of vaginal symptoms with increase discharge. Describes discharge as clear without an odor.   Onset of symptoms 14 days ago. Menses was on 09/15/16 for 5-7 days.  Now spotting again 2 days. Thinks she may have taken OCP a little late.    No cramps.  Denies new personal products or vaginal dryness.    No STD concerns. Urinary symptoms none . Contraception is Mirena IUD and OCP to help with BTB.  History of possible dermoid cyst on the right.    O:  Healthy female WDWN Affect: normal, orientation x 3  Exam: no distress Abdomen: soft and non tender Lymph node: no enlargement or tenderness Pelvic exam: External genital: normal female BUS: negative Vagina: thin dark bloody discharge noted.  Affirm taken. Cervix: normal, non tender, no CMT,  IUD strings are seen Uterus: normal, non tender Adnexa:normal, non tender, no masses or fullness noted    A: R/O Vaginitis  History of BTB on Mirena IUD and using OCP  BTB currently may be related to late OCP  Possible dermoid cyst on the right   P: Discussed findings of vaginitis and would want to rule out STD as cause  Rx: none   Follow with Affirm, GC and CHL  Call back if bleeding continues  RV prn

## 2016-10-06 LAB — WET PREP BY MOLECULAR PROBE
Candida species: NEGATIVE
Gardnerella vaginalis: NEGATIVE
Trichomonas vaginosis: NEGATIVE

## 2016-10-06 LAB — GC/CHLAMYDIA PROBE AMP
CT PROBE, AMP APTIMA: NOT DETECTED
GC PROBE AMP APTIMA: NOT DETECTED

## 2016-10-07 NOTE — Progress Notes (Signed)
Encounter reviewed by Dr. Brook Amundson C. Silva.  

## 2017-02-27 ENCOUNTER — Other Ambulatory Visit: Payer: Self-pay | Admitting: Nurse Practitioner

## 2017-02-27 ENCOUNTER — Encounter: Payer: Self-pay | Admitting: Nurse Practitioner

## 2017-02-27 ENCOUNTER — Ambulatory Visit (INDEPENDENT_AMBULATORY_CARE_PROVIDER_SITE_OTHER): Payer: 59 | Admitting: Nurse Practitioner

## 2017-02-27 VITALS — BP 136/92 | HR 64 | Ht 63.25 in | Wt 194.0 lb

## 2017-02-27 DIAGNOSIS — N83202 Unspecified ovarian cyst, left side: Secondary | ICD-10-CM

## 2017-02-27 DIAGNOSIS — Z0000 Encounter for general adult medical examination without abnormal findings: Secondary | ICD-10-CM | POA: Diagnosis not present

## 2017-02-27 DIAGNOSIS — Z975 Presence of (intrauterine) contraceptive device: Secondary | ICD-10-CM | POA: Diagnosis not present

## 2017-02-27 DIAGNOSIS — N83201 Unspecified ovarian cyst, right side: Secondary | ICD-10-CM | POA: Diagnosis not present

## 2017-02-27 DIAGNOSIS — Z01411 Encounter for gynecological examination (general) (routine) with abnormal findings: Secondary | ICD-10-CM | POA: Diagnosis not present

## 2017-02-27 LAB — COMPREHENSIVE METABOLIC PANEL
ALK PHOS: 66 U/L (ref 33–115)
ALT: 13 U/L (ref 6–29)
AST: 19 U/L (ref 10–30)
Albumin: 4.3 g/dL (ref 3.6–5.1)
BILIRUBIN TOTAL: 0.6 mg/dL (ref 0.2–1.2)
BUN: 19 mg/dL (ref 7–25)
CO2: 24 mmol/L (ref 20–31)
CREATININE: 1.06 mg/dL (ref 0.50–1.10)
Calcium: 9.6 mg/dL (ref 8.6–10.2)
Chloride: 106 mmol/L (ref 98–110)
GLUCOSE: 82 mg/dL (ref 65–99)
POTASSIUM: 3.8 mmol/L (ref 3.5–5.3)
SODIUM: 139 mmol/L (ref 135–146)
TOTAL PROTEIN: 7.7 g/dL (ref 6.1–8.1)

## 2017-02-27 LAB — CBC
HCT: 40 % (ref 35.0–45.0)
HEMOGLOBIN: 12.8 g/dL (ref 11.7–15.5)
MCH: 29.3 pg (ref 27.0–33.0)
MCHC: 32 g/dL (ref 32.0–36.0)
MCV: 91.5 fL (ref 80.0–100.0)
MPV: 8.9 fL (ref 7.5–12.5)
Platelets: 327 10*3/uL (ref 140–400)
RBC: 4.37 MIL/uL (ref 3.80–5.10)
RDW: 13.2 % (ref 11.0–15.0)
WBC: 7.4 10*3/uL (ref 3.8–10.8)

## 2017-02-27 LAB — LIPID PANEL
CHOLESTEROL: 187 mg/dL (ref <200)
HDL: 63 mg/dL (ref >50)
LDL Cholesterol: 108 mg/dL — ABNORMAL HIGH (ref <100)
Total CHOL/HDL Ratio: 3 Ratio (ref <5.0)
Triglycerides: 82 mg/dL (ref <150)
VLDL: 16 mg/dL (ref <30)

## 2017-02-27 MED ORDER — VITAMIN D (ERGOCALCIFEROL) 1.25 MG (50000 UNIT) PO CAPS: 50000.0000 [IU] | ORAL_CAPSULE | ORAL | 0 refills | Status: DC

## 2017-02-27 MED ORDER — NORETHIN ACE-ETH ESTRAD-FE 1-20 MG-MCG PO TABS: 1.0000 | ORAL_TABLET | Freq: Every day | ORAL | 4 refills | Status: DC

## 2017-02-27 NOTE — Patient Instructions (Signed)

## 2017-02-27 NOTE — Progress Notes (Signed)
Encounter reviewed by Dr. Aundria Rud. I agree with pelvic ultrasound and possible endometrial biopsy if still with bleeding irregularity.  Will need to re-evaluate the combination of Mirena and combined oral contraceptives.

## 2017-02-27 NOTE — Progress Notes (Signed)
Patient ID: Courtney Donaldson, female   DOB: 1976-08-04, 41 y.o.   MRN: [id number]  40 y.o. G0P0000 Single  African American Fe here for annual exam.  Mirena IUD 03/2013 with supplemental OCP to help with irregular bleeding. Menses now at 7 days, 3 days heavier, no cramps, PMS.  Still on Loestrin as directed since last June.  Now working a new job at [employer].  Patient's last menstrual period was 01/17/2017 (exact date).          Sexually active: Yes.    The current method of family planning is IUD. Mirena placed in 04/11/13. Exercising: No.  The patient does not participate in regular exercise at present. Smoker:  no  Health Maintenance: Pap: 02/22/16, Negative with neg HR HPV  03/08/13, Negative with neg HR HPV History of Abnormal Pap: no Self Breast exams: yes TDaP: UTD per patient HIV: 02/22/16 Labs: Vitamin D   reports that she has never smoked. She has never used smokeless tobacco. She reports that she drinks about 0.6 - 1.2 oz of alcohol per week . She reports that she does not use drugs.  Past Medical History:  Diagnosis Date  . Bronchitis 10/2013  . Right ovarian cyst 2015   Calcified cyst - possible dermoid.  Needs yearly ultrasound.    Past Surgical History:  Procedure Laterality Date  . INTRAUTERINE DEVICE (IUD) INSERTION  04/11/13   Mirena    Current Outpatient Prescriptions  Medication Sig Dispense Refill  . levonorgestrel (MIRENA) 20 MCG/24HR IUD 1 each by Intrauterine route once. Inserted 04/11/13    . JUNEL FE 1/20 1-20 MG-MCG tablet TAKE 1 TABLET BY MOUTH DAILY. TAKE PILLS CONTINUOUSLY, ONE BY MOUTH DAILY. (Patient not taking: Reported on 02/27/2017) 84 tablet 2  . Vitamin D, Ergocalciferol, (DRISDOL) 50000 units CAPS capsule Take 1 capsule (50,000 Units total) by mouth every 7 (seven) days. (Patient not taking: Reported on 10/05/2016) 30 capsule 0   No current facility-administered medications for this visit.     Family History  Problem Relation Age of Onset   . Diabetes Mother   . Pancreatic cancer Paternal Aunt 33  . Diabetes Maternal Grandmother   . Diabetes Maternal Grandfather   . Breast cancer Paternal Grandmother 58    postmenopausal  . Hypertension Father   . Kidney failure  55    ROS:  Pertinent items are noted in HPI.  Otherwise, a comprehensive ROS was negative.  Exam:   BP (!) 136/92 (BP Location: Right Arm, Patient Position: Sitting, Cuff Size: Large)   Pulse 64   Ht 5' 3.25" (1.607 m)   Wt 194 lb (88 kg)   LMP 01/17/2017 (Exact Date)   BMI 34.09 kg/m  Height: 5' 3.25" (160.7 cm) Ht Readings from Last 3 Encounters:  02/27/17 5' 3.25" (1.607 m)  10/05/16 5' 3.5" (1.613 m)  04/21/16 5' 3.5" (1.613 m)    General appearance: alert, cooperative and appears stated age Head: Normocephalic, without obvious abnormality, atraumatic Neck: no adenopathy, supple, symmetrical, trachea midline and thyroid normal to inspection and palpation Lungs: clear to auscultation bilaterally Breasts: normal appearance, no masses or tenderness Heart: regular rate and rhythm Abdomen: soft, non-tender; no masses,  no organomegaly Extremities: extremities normal, atraumatic, no cyanosis or edema Skin: Skin color, texture, turgor normal. No rashes or lesions Lymph nodes: Cervical, supraclavicular, and axillary nodes normal. No abnormal inguinal nodes palpated Neurologic: Grossly normal   Pelvic: External genitalia:  no lesions  Urethra:  normal appearing urethra with no masses, tenderness or lesions              Bartholin's and Skene's: normal                 Vagina: normal appearing vagina with normal color and discharge, no lesions              Cervix: anteverted IUD string barely visible              Pap taken: No. Bimanual Exam:  Uterus:  normal size, contour, position, consistency, mobility, non-tender              Adnexa: no mass, fullness, tenderness               Rectovaginal: Confirms               Anus:  normal  sphincter tone, no lesions  Chaperone present: yes  A:  Well Woman with normal exam  History of menorrhagia             ? Right dermoid cyst - needs repeat PUS in June this year             Mirena IUD for contraception 04/11/2013             Continues to have menses - with IUD and takes OCP to help with regulation             [place]: DM, kidney failure, Breast cancer and pancreatic cancer  History of Vit D deficiency   P:   Reviewed health and wellness pertinent to exam  Pap smear: no  Mammogram is due at age 26  Refill on OCP for a year  Refill on Vit D - currently off med's  PUS is ordered for June  Will follow with labs  Counseled on breast self exam, use and side effects of OCP's, adequate intake of calcium and vitamin D, diet and exercise return annually or prn  An After Visit Summary was printed and given to the patient.

## 2017-02-28 ENCOUNTER — Telehealth: Payer: Self-pay | Admitting: Obstetrics and Gynecology

## 2017-02-28 LAB — VITAMIN D 25 HYDROXY (VIT D DEFICIENCY, FRACTURES): VIT D 25 HYDROXY: 15 ng/mL — AB (ref 30–100)

## 2017-02-28 LAB — TSH: TSH: 1.56 mIU/L

## 2017-02-28 LAB — HEMOGLOBIN A1C
HEMOGLOBIN A1C: 4.8 % (ref <5.7)
MEAN PLASMA GLUCOSE: 91 mg/dL

## 2017-02-28 NOTE — Telephone Encounter (Signed)
Spoke with patient regarding benefit for an ultrasound. Patient understood and agreeable. Patient ready to schedule. Patient scheduled 03/16/17 with Dr Quincy Simmonds. Earlier appointment dates were offered, but patient declined an earlier appointment, due to her work schedule. Patient aware of date, arrival time and cancellation. No further questions.   Routing to Dr Quincy Simmonds for final review  cc: Kem [place]s

## 2017-02-28 NOTE — Telephone Encounter (Signed)
[place] for date as planned.  May close encounter.

## 2017-03-01 NOTE — Telephone Encounter (Signed)
Encounter closed

## 2017-03-16 ENCOUNTER — Ambulatory Visit (INDEPENDENT_AMBULATORY_CARE_PROVIDER_SITE_OTHER): Payer: 59

## 2017-03-16 ENCOUNTER — Other Ambulatory Visit: Payer: Self-pay

## 2017-03-16 ENCOUNTER — Ambulatory Visit (INDEPENDENT_AMBULATORY_CARE_PROVIDER_SITE_OTHER): Payer: 59 | Admitting: Obstetrics and Gynecology

## 2017-03-16 ENCOUNTER — Encounter: Payer: Self-pay | Admitting: Obstetrics and Gynecology

## 2017-03-16 VITALS — BP 124/78 | HR 60 | Ht 63.25 in | Wt 191.0 lb

## 2017-03-16 DIAGNOSIS — N83202 Unspecified ovarian cyst, left side: Secondary | ICD-10-CM | POA: Diagnosis not present

## 2017-03-16 DIAGNOSIS — N83201 Unspecified ovarian cyst, right side: Secondary | ICD-10-CM

## 2017-03-16 NOTE — Progress Notes (Signed)
Encounter reviewed by Dr. Edom Schmuhl Amundson C. Silva.  

## 2017-03-16 NOTE — Patient Instructions (Signed)
Ovarian Cyst °An ovarian cyst is a fluid-filled sac on an ovary. The ovaries are organs that make eggs in women. Most ovarian cysts go away on their own and are not cancerous (are benign). Some cysts need treatment. °Follow these instructions at home: °· Take over-the-counter and prescription medicines only as told by your doctor. °· Do not drive or use heavy machinery while taking prescription pain medicine. °· Get pelvic exams and Pap tests as often as told by your doctor. °· Return to your normal activities as told by your doctor. Ask your doctor what activities are safe for you. °· Do not use any products that contain nicotine or tobacco, such as cigarettes and e-cigarettes. If you need help quitting, ask your doctor. °· Keep all follow-up visits as told by your doctor. This is important. °Contact a doctor if: °· Your periods are: °¨ Late. °¨ Irregular. °¨ Painful. °· Your periods stop. °· You have pelvic pain that does not go away. °· You have pressure on your bladder. °· You have trouble making your bladder empty when you pee (urinate). °· You have pain during sex. °· You have any of the following in your belly (abdomen): °¨ A feeling of fullness. °¨ Pressure. °¨ Discomfort. °¨ Pain that does not go away. °¨ Swelling. °· You feel sick most of the time. °· You have trouble pooping (have constipation). °· You are not as hungry as usual (you lose your appetite). °· You get very bad acne. °· You start to have more hair on your body and face. °· You are gaining weight or losing weight without changing your exercise and eating habits. °· You think you may be pregnant. °Get help right away if: °· You have belly pain that is very bad or gets worse. °· You cannot eat or drink without throwing up (vomiting). °· You suddenly get a fever. °· Your period is a lot heavier than usual. °This information is not intended to replace advice given to you by your health care provider. Make sure you discuss any questions you have  with your health care provider. °Document Released: 04/04/2008 Document Revised: 05/06/2016 Document Reviewed: 03/20/2016 °Elsevier Interactive Patient Education © 2017 [employer] ° °

## 2017-03-16 NOTE — Progress Notes (Signed)
Patient ID: Courtney Donaldson, female   DOB: [birth year], 41 y.o.   MRN: [id number] GYNECOLOGY  VISIT   HPI: 41 y.o.   Single  African American  female   [place] with Patient's last menstrual period was 03/11/2017.   here for pelvic ultrasound for menorrhagia and follow up on bilateral ovarian cysts.    Denies any pain.   Prior ultrasounds:  Pelvic ultrasound 03/03/16:  Uterus - no masses. EMS - 3.67 mm. IUD in place. Ovaries - right ovary with cystic mass with calcifications and low level echoes - 39 x 26 x 29 mm (was 21 x 21 x 16 in 2015). Consistent with dermoid. Left ovary with cyst 39 x 31 x 35 mm. Has a thin wall. Avascular. (was 41 x 31 x 36 mm). Free fluid - no  Pelvic ultrasound 04/21/16: No uterine masses.  EMS 3.23 mm. Right ovary with 25 x 22 mm cyst with calcifications. Avascular.  Dermoid vs. Other etiology.  Left ovary with 14 mm simple cyst.  Avascular.  No free fluid.  Patient has a Mirena IUD.  Was placed on combined oral contraception for breakthrough bleeding.  She has been continued on this periodically.  She has menses every month and lasts for 7 days.  Not heavy.  Occasional cramping.   GYNECOLOGIC HISTORY: Patient's last menstrual period was 03/11/2017. Contraception: Mirena inserted 04-11-13 Menopausal hormone therapy:  n/a Last mammogram:  n/a Last pap smear:   02/22/16, Negative with neg HR HPV                               03/08/13, Negative with neg HR HPV        OB History    Gravida Para Term Preterm AB Living   0 0 0 0 0 0   SAB TAB Ectopic Multiple Live Births   0 0 0 0 0         Patient Active Problem List   Diagnosis Date Noted  . IUD (intrauterine device) in place 05/17/2013  . Morbid obesity ([place]) 03/08/2013    Past Medical History:  Diagnosis Date  . Bronchitis 10/2013  . Right ovarian cyst 2015   Calcified cyst - possible dermoid.  Needs yearly ultrasound.    Past Surgical History:  Procedure Laterality Date  . INTRAUTERINE  DEVICE (IUD) INSERTION  04/11/13   Mirena    Current Outpatient Prescriptions  Medication Sig Dispense Refill  . levonorgestrel (MIRENA) 20 MCG/24HR IUD 1 each by Intrauterine route once. Inserted 04/11/13    . norethindrone-ethinyl estradiol (JUNEL FE 1/20) 1-20 MG-MCG tablet Take 1 tablet by mouth daily. Take pills continuously, one by mouth daily. 84 tablet 4  . Vitamin D, Ergocalciferol, (DRISDOL) 50000 units CAPS capsule Take 1 capsule (50,000 Units total) by mouth every 7 (seven) days. 30 capsule 0   No current facility-administered medications for this visit.      ALLERGIES: Amoxicillin  Family History  Problem Relation Age of Onset  . Diabetes Mother   . Pancreatic cancer Paternal Aunt 29  . Diabetes Maternal Grandmother   . Diabetes Maternal Grandfather   . Breast cancer Paternal Grandmother 71       postmenopausal  . Hypertension Father   . Kidney failure Unknown 7    Social History   Social History  . Marital status: Single    Spouse name: N/A  . Number of children: N/A  . Years of education: N/A  Occupational History  . Not on file.   Social History Main Topics  . Smoking status: Never Smoker  . Smokeless tobacco: Never Used  . Alcohol use 0.6 - 1.2 oz/week    1 - 2 Standard drinks or equivalent per week  . Drug use: No  . Sexual activity: Yes    Partners: Male    Birth control/ protection: IUD, Pill     Comment: Mirena Inserted 04/11/13/Junel 1/20   Other Topics Concern  . Not on file   Social History Narrative  . No narrative on file    ROS:  Pertinent items are noted in HPI.  PHYSICAL EXAMINATION:    BP 124/78 (BP Location: Right Arm, Patient Position: Sitting, Cuff Size: Normal)   Pulse 60   Ht 5' 3.25" (1.607 m)   Wt 191 lb (86.6 kg)   LMP 03/11/2017   BMI 33.57 kg/m     General appearance: alert, cooperative and appears stated age   Pelvic ultrasound today: No myometrial masses.  IUD in canal.  EMS 4.41 mm.  Right ovary with  cyst 28 x 21 mm, fibroma versus dermoid.  Left ovary normal. No free fluid.  Chaperone was present for exam.  ASSESSMENT  Right ovarian cyst, minimally increased in size since [place] last year.  Asymptomatic.  PLAN  Discussed ovarian cysts. Will plan for yearly ultrasounds unless has pain, bloating, or change in bowel function, which would prompt sooner evaluation.  We discussed a laparoscopic approach to removal of the cyst/ovary.  I am not recommending this at this time.    An After Visit Summary was printed and given to the patient.  __25____ minutes face to face time of which over 50% was spent in counseling.

## 2017-05-25 ENCOUNTER — Telehealth: Payer: Self-pay | Admitting: Obstetrics and Gynecology

## 2017-05-25 NOTE — Telephone Encounter (Signed)
Patient is ready to restart her birth control due her irregular cycles. Confirmed pharmacy on file.

## 2017-05-25 NOTE — Telephone Encounter (Signed)
Spoke with patient. Mirena IUD in place. Had PUS with [name] 03/16/17, had been taking OCP in addition to Mirena for breakthrough bleeding, this was discontinued at that time. Patient states since stopping OCP has been experiencing long cycles, current cycle started beginning of July. Changing regular pad 3 times per day, reports bleeding is not heavy, just continuous. Requesting to restart OCP.  Denies pain, fatigue, SHOB, HA, dizziness.   Advised patient would review with [name] and return call with recommendations, patient is agreeable.  [name], please advise on OCP?

## 2017-05-25 NOTE — Telephone Encounter (Signed)
Spoke with patient, advised as seen below per [name]. Patient request 05/31/17 to schedule. Patient scheduled for 8/1 at 10am with [name]. Advised patient should bleeding increase to changing pad q1-2 hours or new symptoms develop, return call to office. Patient verbalizes understanding and is agreeable.   Routing to provider for final review. Patient is agreeable to disposition. Will close encounter.   Cc: Lerry Liner, Theresia Lo

## 2017-05-25 NOTE — Telephone Encounter (Signed)
I think the patient needs to return for a possible endometrial biopsy and re-evaluation of bleeding.

## 2017-05-31 ENCOUNTER — Ambulatory Visit: Payer: Self-pay | Admitting: Obstetrics and Gynecology

## 2017-05-31 ENCOUNTER — Encounter: Payer: Self-pay | Admitting: Obstetrics and Gynecology

## 2017-05-31 VITALS — BP 122/78 | HR 64 | Resp 16 | Wt 190.0 lb

## 2017-05-31 DIAGNOSIS — T8332XA Displacement of intrauterine contraceptive device, initial encounter: Secondary | ICD-10-CM

## 2017-05-31 DIAGNOSIS — N926 Irregular menstruation, unspecified: Secondary | ICD-10-CM | POA: Diagnosis not present

## 2017-05-31 LAB — CBC
HEMATOCRIT: 40.3 % (ref 34.0–46.6)
HEMOGLOBIN: 13 g/dL (ref 11.1–15.9)
MCH: 29 pg (ref 26.6–33.0)
MCHC: 32.3 g/dL (ref 31.5–35.7)
MCV: 90 fL (ref 79–97)
Platelets: 327 10*3/uL (ref 150–379)
RBC: 4.48 x10E6/uL (ref 3.77–5.28)
RDW: 13.9 % (ref 12.3–15.4)
WBC: 5.8 10*3/uL (ref 3.4–10.8)

## 2017-05-31 LAB — POCT URINE PREGNANCY: Preg Test, Ur: NEGATIVE

## 2017-05-31 NOTE — Progress Notes (Signed)
GYNECOLOGY  VISIT   HPI: 41 y.o.   Single  African American  female   [place] with Patient's last menstrual period was 05/01/2017.   here for irregular bleeding; period started July 2 and ended July 26. At the worst, was changing pad 3 times per day.  No dizziness or light headedness.  Seen on 03/16/17 and had ultrasound for menorrhagia and follow up of prior noted bilateral ovarian cysts.  US showed right ovary with 21 mm follicle and 21 x 28 cc cystic area with calcifications and shadowing - possible dermoid versus fibroma. (slight increase in size.) Left ovary normal  Uterus with IUD in normal position and no fibroids noted.   IUD placed in June 2014.  Cycles were monthly initially.  Also would spot a lot.  Sex also prompts bleeding.   Previously used combined oral contraception periodically for breakthrough bleeding.  This helped but would forget so she would have breakthrough bleeding.   Had negative GC/CT in December 2017. No partner change.   TSH normal on 02/27/17.   UPT: Negative  GYNECOLOGIC HISTORY: Patient's last menstrual period was 05/01/2017. Contraception:  Mirena IUD inserted 04/11/13 Menopausal hormone therapy:  n/a Last mammogram:  none Last pap smear:   02/22/16, Negative with neg HR HPV                   03/08/13, Negative with neg HR HPV        OB History    Gravida Para Term Preterm AB Living   0 0 0 0 0 0   SAB TAB Ectopic Multiple Live Births   0 0 0 0 0         Patient Active Problem List   Diagnosis Date Noted  . IUD (intrauterine device) in place 05/17/2013  . Morbid obesity ([place]) 03/08/2013    Past Medical History:  Diagnosis Date  . Bronchitis 10/2013  . Right ovarian cyst 2015   Calcified cyst - possible dermoid.  Needs yearly ultrasound.    Past Surgical History:  Procedure Laterality Date  . INTRAUTERINE DEVICE (IUD) INSERTION  04/11/13   Mirena    Current Outpatient Prescriptions  Medication Sig Dispense Refill   . levonorgestrel (MIRENA) 20 MCG/24HR IUD 1 each by Intrauterine route once. Inserted 04/11/13     No current facility-administered medications for this visit.      ALLERGIES: Amoxicillin  Family History  Problem Relation Age of Onset  . Diabetes Mother   . Pancreatic cancer Paternal Aunt 42  . Diabetes Maternal Grandmother   . Diabetes Maternal Grandfather   . Breast cancer Paternal Grandmother 27       postmenopausal  . Hypertension Father   . Kidney failure Unknown 63    Social History   Social History  . Marital status: Single    Spouse name: N/A  . Number of children: N/A  . Years of education: N/A   Occupational History  . Not on file.   Social History Main Topics  . Smoking status: Never Smoker  . Smokeless tobacco: Never Used  . Alcohol use 0.6 - 1.2 oz/week    1 - 2 Standard drinks or equivalent per week  . Drug use: No  . Sexual activity: Yes    Partners: Male    Birth control/ protection: IUD     Comment: Mirena Inserted 04/11/13   Other Topics Concern  . Not on file   Social History Narrative  . No narrative on file  ROS:  Pertinent items are noted in HPI.  PHYSICAL EXAMINATION:    BP 122/78 (BP Location: Right Arm, Patient Position: Sitting, Cuff Size: Normal)   Pulse 64   Resp 16   Wt 190 lb (86.2 kg)   LMP 05/01/2017   BMI 33.39 kg/m     General appearance: alert, cooperative and appears stated age  Pelvic: External genitalia:  no lesions              Urethra:  normal appearing urethra with no masses, tenderness or lesions              Bartholins and Skenes: normal                 Vagina: normal appearing vagina with normal color and discharge, no lesions              Cervix: no lesions.  IUD strings not seen or felt.   Brown blood in the vagina.  Cervix bleeds to touch with Q-tip.                Bimanual Exam:  Uterus:  normal size, contour, position, consistency, mobility, non-tender              Adnexa: no mass, fullness,  tenderness             Chaperone was present for exam.  ASSESSMENT  Irregular bleeding with Mirena IUD.  Lost IUD strings.  Hx right ovarian cyst.   PLAN  CBC today.  GC/CT.  Return for pelvic ultrasound to confirm IUD position.  Will likely do EMB then.   An After Visit Summary was printed and given to the patient.  ___15___ minutes face to face time of which over 50% was spent in counseling.

## 2017-06-01 ENCOUNTER — Telehealth: Payer: Self-pay | Admitting: Obstetrics and Gynecology

## 2017-06-01 NOTE — Telephone Encounter (Signed)
Thank you for the update.  Encounter closed. 

## 2017-06-01 NOTE — Telephone Encounter (Signed)
Spoke with patient regarding benefit for an ultrasound and endometrial biopsy. Patient understood and agreeable. Patient ready to schedule. Patient scheduled 06/08/17 with Dr Quincy Simmonds. Patient aware of date, arrival time and cancellation policy. No further questions.   Routing to Dr Quincy Simmonds

## 2017-06-02 LAB — GC/CHLAMYDIA PROBE AMP
Chlamydia trachomatis, NAA: NEGATIVE
Neisseria gonorrhoeae by PCR: NEGATIVE

## 2017-06-08 ENCOUNTER — Ambulatory Visit (INDEPENDENT_AMBULATORY_CARE_PROVIDER_SITE_OTHER): Payer: 59 | Admitting: Obstetrics and Gynecology

## 2017-06-08 ENCOUNTER — Ambulatory Visit (INDEPENDENT_AMBULATORY_CARE_PROVIDER_SITE_OTHER): Payer: 59

## 2017-06-08 ENCOUNTER — Encounter: Payer: Self-pay | Admitting: Obstetrics and Gynecology

## 2017-06-08 VITALS — BP 122/78 | HR 68 | Resp 16 | Wt 190.0 lb

## 2017-06-08 DIAGNOSIS — N83202 Unspecified ovarian cyst, left side: Secondary | ICD-10-CM

## 2017-06-08 DIAGNOSIS — T8332XA Displacement of intrauterine contraceptive device, initial encounter: Secondary | ICD-10-CM | POA: Diagnosis not present

## 2017-06-08 DIAGNOSIS — N83201 Unspecified ovarian cyst, right side: Secondary | ICD-10-CM | POA: Diagnosis not present

## 2017-06-08 DIAGNOSIS — N926 Irregular menstruation, unspecified: Secondary | ICD-10-CM

## 2017-06-08 NOTE — Patient Instructions (Signed)

## 2017-06-08 NOTE — Progress Notes (Signed)
GYNECOLOGY  VISIT   HPI: 41 y.o.   Single  African American  female   [place] with Patient's last menstrual period was 05/01/2017.   here for   [place] and possible EMB. Having irregular bleeding with Mirena IUD.  Bled from &/2/18 - 05/25/17. IUD strings were not seen or felt on 05/31/17.  The following is taken from my note on 05/31/17. Seen on 03/16/17 and had ultrasound for menorrhagia and follow up of prior noted bilateral ovarian cysts.  US showed right ovary with 21 mm follicle and 21 x 28 cc cystic area with calcifications and shadowing - possible dermoid versus fibroma. (slight increase in size.) Left ovary normal  Uterus with IUD in normal position and no fibroids noted.   IUD placed in June 2014.  Cycles were monthly initially.  Also would spot a lot.  Sex also prompts bleeding.   Previously used combined oral contraception periodically for breakthrough bleeding.  This helped but would forget so she would have breakthrough bleeding.   Had negative GC/CT in December 2017. No partner change.   TSH normal on 02/27/17.   GYNECOLOGIC HISTORY: Patient's last menstrual period was 05/01/2017. Contraception:  Mirena IUD inserted 04/11/13 Menopausal hormone therapy:  n/a Last mammogram:  none Last pap smear:   02/22/16, Negative with neg HR HPV 03/08/13, Negative with neg HR HPV        OB History    Gravida Para Term Preterm AB Living   0 0 0 0 0 0   SAB TAB Ectopic Multiple Live Births   0 0 0 0 0         Patient Active Problem List   Diagnosis Date Noted  . IUD (intrauterine device) in place 05/17/2013  . Morbid obesity ([place]) 03/08/2013    Past Medical History:  Diagnosis Date  . Bronchitis 10/2013  . Right ovarian cyst 2015   Calcified cyst - possible dermoid.  Needs yearly ultrasound.    Past Surgical History:  Procedure Laterality Date  . INTRAUTERINE DEVICE (IUD) INSERTION  04/11/13   Mirena    Current Outpatient Prescriptions   Medication Sig Dispense Refill  . levonorgestrel (MIRENA) 20 MCG/24HR IUD 1 each by Intrauterine route once. Inserted 04/11/13     No current facility-administered medications for this visit.      ALLERGIES: Amoxicillin  Family History  Problem Relation Age of Onset  . Diabetes Mother   . Pancreatic cancer Paternal Aunt 63  . Diabetes Maternal Grandmother   . Diabetes Maternal Grandfather   . Breast cancer Paternal Grandmother 56       postmenopausal  . Hypertension Father   . Kidney failure Unknown 55    Social History   Social History  . Marital status: Single    Spouse name: N/A  . Number of children: N/A  . Years of education: N/A   Occupational History  . Not on file.   Social History Main Topics  . Smoking status: Never Smoker  . Smokeless tobacco: Never Used  . Alcohol use 0.6 - 1.2 oz/week    1 - 2 Standard drinks or equivalent per week  . Drug use: No  . Sexual activity: Yes    Partners: Male    Birth control/ protection: IUD     Comment: Mirena Inserted 04/11/13   Other Topics Concern  . Not on file   Social History Narrative  . No narrative on file    ROS:  Pertinent items are noted in HPI.  PHYSICAL EXAMINATION:    BP 122/78 (BP Location: Right Arm, Patient Position: Sitting, Cuff Size: Normal)   Pulse 68   Resp 16   Wt 190 lb (86.2 kg)   LMP 05/01/2017   BMI 33.39 kg/m     General appearance: alert, cooperative and appears stated age    Pelvic: External genitalia:  no lesions              Urethra:  normal appearing urethra with no masses, tenderness or lesions              Bartholins and Skenes: normal                 Vagina: normal appearing vagina with normal color and discharge, no lesions              Cervix: no lesions.  IUD string not seen.                 Bimanual Exam:  Uterus:  normal size, contour, position, consistency, mobility, non-tender              Adnexa: no mass, fullness, tenderness             Pelvic ultrasound  today: Uterus with IUD in endometrial canal.  Strings 8 mm from ext os.  No fibroids.  Right ovary with probably dermoid cyst 26 x 20 x 19 mm.  No change.  Left ovary with 42 x 36 x 39 mm thin walled cyst with 2 thin septations, echofree and no abnormal blood flow. No free fluid.   EMB with IUD in place. Consent for procedure.  Sterile prep with Hibiclens.  Tenaculum to anterior cervical lip.  Paracervical block with 10 cc 1% lidocaine, lot [id number], exp 10/2018. Small pipelle to 6 cm once.  Tissue obtained and sent to pathology.  Minimal EBL.  No complications.  Tolerated procedure well.  BM  performed after EMB.  See above.  Chaperone was present for exam.  ASSESSMENT  Bilateral ovarian cysts. Stable right ovarian cyst, expected dermoid.  New left ovarian cyst.  Abnormal uterine bleeding with Mirena IUD.  Atrophy?  Polyp?  Endometritis?  PLAN  Follow up EMB with final plan after results back.  Instructions and precautions given.  Remove IUD and continue on combined OCPs? Yearly ultrasound to check ovaries. She will schedule her first mammogram.  Facility contact information given and discussed 3D mammogram.  She is ok to do the routine digital mammogram for the first one.    An After Visit Summary was printed and given to the patient.  ___15_ minutes face to face time of which over 50% was spent in counseling.

## 2017-06-08 NOTE — Progress Notes (Signed)
Encounter reviewed by Dr. Brook Amundson C. Silva.  

## 2017-06-10 DIAGNOSIS — N83201 Unspecified ovarian cyst, right side: Secondary | ICD-10-CM | POA: Insufficient documentation

## 2017-06-10 DIAGNOSIS — N83202 Unspecified ovarian cyst, left side: Secondary | ICD-10-CM

## 2017-06-12 ENCOUNTER — Encounter: Payer: Self-pay | Admitting: Obstetrics and Gynecology

## 2017-06-13 ENCOUNTER — Telehealth: Payer: Self-pay

## 2017-06-13 MED ORDER — NORETHIN-ETH ESTRAD-FE BIPHAS 1 MG-10 MCG / 10 MCG PO TABS: 1.0000 | ORAL_TABLET | Freq: Every day | ORAL | 0 refills | Status: DC

## 2017-06-13 NOTE — Telephone Encounter (Signed)
Left message to call [name] at [phone]. 

## 2017-06-13 NOTE — Telephone Encounter (Signed)
Spoke with patient. Advised of message and results as seen below from [place]. Patient verbalizes understanding. Would like to keep Mirena in place and start taking LoLoestrin. Rx for LoLoestrin #3 [id number] sent to pharmacy on file. Follow up PUS scheduled for 09/14/2017 at 8 am with 8:30 am consult with Dr.Silva. Patient is agreeable to date and time.  Routing to provider for final review. Patient agreeable to disposition. Will close encounter.

## 2017-06-13 NOTE — Telephone Encounter (Signed)
Patient returned call to [name]. °

## 2017-06-13 NOTE — Telephone Encounter (Signed)
-----   Message from Nunzio Cobbs, MD sent at 06/12/2017 11:39 AM EDT ----- Please inform patient of her EMB showing benign endometrium.  Her options for care are:  - continue with Mirena and add OCP to her regimen using LoLoEstrin for 3 months and then have a recheck and [place] to look at her left ovary which has developed a new cyst with septations.  (We know her right ovary has what appears to be a small dermoid.) - remove the Mirena and take LoLoEstrin for 3 months and still do the pelvic ultrasound in follow up.  There are other choices, but these are the most simple options.   She will need a precert for the pelvic ultrasound to be done in November.

## 2017-09-05 DIAGNOSIS — H52223 Regular astigmatism, bilateral: Secondary | ICD-10-CM | POA: Diagnosis not present

## 2017-09-12 ENCOUNTER — Telehealth: Payer: Self-pay | Admitting: Obstetrics and Gynecology

## 2017-09-12 NOTE — Telephone Encounter (Signed)
Patient returning call.

## 2017-09-12 NOTE — Telephone Encounter (Signed)
Returned call to patient. Called patient to review benefits for scheduled ultrasound appointment. Left voicemail message requesting a return call.

## 2017-09-12 NOTE — Telephone Encounter (Signed)
Call placed to patient to review benefits for scheduled ultrasound appointment. Left voicemail message requesting a return call °

## 2017-09-13 ENCOUNTER — Other Ambulatory Visit: Payer: Self-pay

## 2017-09-13 DIAGNOSIS — N83202 Unspecified ovarian cyst, left side: Secondary | ICD-10-CM

## 2017-09-13 NOTE — Telephone Encounter (Signed)
Information conveyed to patient

## 2017-09-14 ENCOUNTER — Ambulatory Visit (INDEPENDENT_AMBULATORY_CARE_PROVIDER_SITE_OTHER): Payer: 59

## 2017-09-14 ENCOUNTER — Ambulatory Visit: Payer: 59 | Admitting: Obstetrics and Gynecology

## 2017-09-14 ENCOUNTER — Other Ambulatory Visit: Payer: Self-pay | Admitting: Obstetrics and Gynecology

## 2017-09-14 ENCOUNTER — Encounter: Payer: Self-pay | Admitting: Obstetrics and Gynecology

## 2017-09-14 VITALS — BP 128/82 | HR 60 | Ht 63.25 in | Wt 192.8 lb

## 2017-09-14 DIAGNOSIS — N83202 Unspecified ovarian cyst, left side: Secondary | ICD-10-CM | POA: Diagnosis not present

## 2017-09-14 DIAGNOSIS — N83201 Unspecified ovarian cyst, right side: Secondary | ICD-10-CM

## 2017-09-14 MED ORDER — NORETHIN-ETH ESTRAD-FE BIPHAS 1 MG-10 MCG / 10 MCG PO TABS: 1.0000 | ORAL_TABLET | Freq: Every day | ORAL | 1 refills | Status: DC

## 2017-09-14 NOTE — Progress Notes (Signed)
Encounter reviewed by Dr. Loyal Rudy Amundson C. Silva.  

## 2017-09-14 NOTE — Patient Instructions (Signed)
Ovarian Cyst An ovarian cyst is a fluid-filled sac that forms on an ovary. The ovaries are small organs that produce eggs in women. Various types of cysts can form on the ovaries. Some may cause symptoms and require treatment. Most ovarian cysts go away on their own, are not cancerous (are benign), and do not cause problems. Common types of ovarian cysts include:  Functional (follicle) cysts. ? Occur during the menstrual cycle, and usually go away with the next menstrual cycle if you do not get pregnant. ? Usually cause no symptoms.  Endometriomas. ? Are cysts that form from the tissue that lines the uterus (endometrium). ? Are sometimes called "chocolate cysts" because they become filled with blood that turns brown. ? Can cause pain in the lower abdomen during intercourse and during your period.  Cystadenoma cysts. ? Develop from cells on the outside surface of the ovary. ? Can get very large and cause lower abdomen pain and pain with intercourse. ? Can cause severe pain if they twist or break open (rupture).  Dermoid cysts. ? Are sometimes found in both ovaries. ? May contain different kinds of body tissue, such as skin, teeth, hair, or cartilage. ? Usually do not cause symptoms unless they get very big.  Theca lutein cysts. ? Occur when too much of a certain hormone (human chorionic gonadotropin) is produced and overstimulates the ovaries to produce an egg. ? Are most common after having procedures used to assist with the conception of a baby (in vitro fertilization).  What are the causes? Ovarian cysts may be caused by:  Ovarian hyperstimulation syndrome. This is a condition that can develop from taking fertility medicines. It causes multiple large ovarian cysts to form.  Polycystic ovarian syndrome (PCOS). This is a common hormonal disorder that can cause ovarian cysts, as well as problems with your period or fertility.  What increases the risk? The following factors may make  you more likely to develop ovarian cysts:  Being overweight or obese.  Taking fertility medicines.  Taking certain forms of hormonal birth control.  Smoking.  What are the signs or symptoms? Many ovarian cysts do not cause symptoms. If symptoms are present, they may include:  Pelvic pain or pressure.  Pain in the lower abdomen.  Pain during sex.  Abdominal swelling.  Abnormal menstrual periods.  Increasing pain with menstrual periods.  How is this diagnosed? These cysts are commonly found during a routine pelvic exam. You may have tests to find out more about the cyst, such as:  Ultrasound.  X-ray of the pelvis.  CT scan.  MRI.  Blood tests.  How is this treated? Many ovarian cysts go away on their own without treatment. Your health care provider may want to check your cyst regularly for 2-3 months to see if it changes. If you are in menopause, it is especially important to have your cyst monitored closely because menopausal women have a higher rate of ovarian cancer. When treatment is needed, it may include:  Medicines to help relieve pain.  A procedure to drain the cyst (aspiration).  Surgery to remove the whole cyst.  Hormone treatment or birth control pills. These methods are sometimes used to help dissolve a cyst.  Follow these instructions at home:  Take over-the-counter and prescription medicines only as told by your health care provider.  Do not drive or use heavy machinery while taking prescription pain medicine.  Get regular pelvic exams and Pap tests as often as told by your health care   provider.  Return to your normal activities as told by your health care provider. Ask your health care provider what activities are safe for you.  Do not use any products that contain nicotine or tobacco, such as cigarettes and e-cigarettes. If you need help quitting, ask your health care provider.  Keep all follow-up visits as told by your health care provider.  This is important. Contact a health care provider if:  Your periods are late, irregular, or painful, or they stop.  You have pelvic pain that does not go away.  You have pressure on your bladder or trouble emptying your bladder completely.  You have pain during sex.  You have any of the following in your abdomen: ? A feeling of fullness. ? Pressure. ? Discomfort. ? Pain that does not go away. ? Swelling.  You feel generally ill.  You become constipated.  You lose your appetite.  You develop severe acne.  You start to have more body hair and facial hair.  You are gaining weight or losing weight without changing your exercise and eating habits.  You think you may be pregnant. Get help right away if:  You have abdominal pain that is severe or gets worse.  You cannot eat or drink without vomiting.  You suddenly develop a fever.  Your menstrual period is much heavier than usual. This information is not intended to replace advice given to you by your health care provider. Make sure you discuss any questions you have with your health care provider. Document Released: 10/17/2005 Document Revised: 05/06/2016 Document Reviewed: 03/20/2016 Elsevier Interactive Patient Education  2018 [employer]  

## 2017-09-14 NOTE — Progress Notes (Signed)
Patient ID: Courtney Donaldson, female   DOB: 01/04/1976, 41 y.o.   MRN: [id number] GYNECOLOGY  VISIT   HPI: 41 y.o.   Single  African American  female   [place] with Patient's last menstrual period was 08/05/2017 (exact date).   here for pelvic ultrasound for recheck of bilateral ovarian cysts and bleeding profile since starting LoLoestrin for a 3 month trial.   Cycles are still heavy and red blood with cramping for the first 2 days.  Menses last 5 days.  No bleeding in between cycles but just ran out of OCPs.   Last [place] 06/08/17: Uterus no masses. IUD in proper position.  Strings have not been visible vaginally.  Right ovary with dermoid cyst 26 x 20 x 19 mm, avascular.  No change.  Left ovary with thin walled byst 42 x 36 x 39 mm with 2 septations, avascular.  No free fluid.  Had EMB on 06/08/17 due to irregular bleeding. Results inactive endometrium with decidualized stroma.  No malignancy.   She states she has completed childbearing.   GYNECOLOGIC HISTORY: Patient's last menstrual period was 08/05/2017 (exact date). Contraception:  Mirena IUD inserted 04-11-13 Menopausal hormone therapy: n/a Last mammogram:  n.a Last pap smear:  02/22/16 Neg:Neg HR HPV                               03-08-13 Neg:Neg HR HPV        OB History    Gravida Para Term Preterm AB Living   0 0 0 0 0 0   SAB TAB Ectopic Multiple Live Births   0 0 0 0 0         Patient Active Problem List   Diagnosis Date Noted  . Bilateral ovarian cysts 06/10/2017  . IUD (intrauterine device) in place 05/17/2013  . Morbid obesity ([place]) 03/08/2013    Past Medical History:  Diagnosis Date  . Bronchitis 10/2013  . Right ovarian cyst 2015   Calcified cyst - possible dermoid.  Needs yearly ultrasound.    Past Surgical History:  Procedure Laterality Date  . INTRAUTERINE DEVICE (IUD) INSERTION  04/11/13   Mirena    Current Outpatient Medications  Medication Sig Dispense Refill  . levonorgestrel (MIRENA) 20 MCG/24HR  IUD 1 each by Intrauterine route once. Inserted 04/11/13    . Norethindrone-Ethinyl Estradiol-Fe Biphas (LO LOESTRIN FE) 1 MG-10 MCG / 10 MCG tablet Take 1 tablet by mouth daily. (Patient not taking: Reported on 09/14/2017) 3 Package 0   No current facility-administered medications for this visit.      ALLERGIES: Amoxicillin  Family History  Problem Relation Age of Onset  . Diabetes Mother   . Pancreatic cancer Paternal Aunt 99  . Diabetes Maternal Grandmother   . Diabetes Maternal Grandfather   . Breast cancer Paternal Grandmother 73       postmenopausal  . Hypertension Father   . Kidney failure Unknown 106    Social History   Socioeconomic History  . Marital status: Single    Spouse name: Not on file  . Number of children: Not on file  . Years of education: Not on file  . Highest education level: Not on file  Social Needs  . Financial resource strain: Not on file  . Food insecurity - worry: Not on file  . Food insecurity - inability: Not on file  . Transportation needs - medical: Not on file  . Transportation needs -  non-medical: Not on file  Occupational History  . Not on file  Tobacco Use  . Smoking status: Never Smoker  . Smokeless tobacco: Never Used  Substance and Sexual Activity  . Alcohol use: Yes    Alcohol/week: 0.6 - 1.2 oz    Types: 1 - 2 Standard drinks or equivalent per week  . Drug use: No  . Sexual activity: Yes    Partners: Male    Birth control/protection: IUD    Comment: Mirena Inserted 04/11/13  Other Topics Concern  . Not on file  Social History Narrative  . Not on file    ROS:  Pertinent items are noted in HPI.  PHYSICAL EXAMINATION:    BP 128/82 (BP Location: Right Arm, Patient Position: Sitting, Cuff Size: Large)   Pulse 60   Ht 5' 3.25" (1.607 m)   Wt 192 lb 12.8 oz (87.5 kg)   LMP 08/05/2017 (Exact Date)   BMI 33.88 kg/m     General appearance: alert, cooperative and appears stated age   Pelvic US: Uterus no masses.  IUD in  endometrial canal.  EMS 3 mm.  Right ovary - 2.6 cm solid ovarian area with calcifications, no abnormal blood flow. Stable.  Left ovary - cyst resolved.  No free fluid.  ASSESSMENT  Solid right ovarian mass. Dermoid? Fibroma? Mirena IUD - will expire June 2019.  On LoLoestrin OCPs for cycle regulation.   PLAN  I discussed ovarian cysts and focused on dermoid cysts but also more unusual causes of solid ovarian masses.  Will check comprehensive tumor markers today:  AFP, LDH, estradiol, hCG, and Ca125. I discussed potential laparoscopic removal of right ovary and bilateral fallopian tubes.  Ok to continue combined OCPs at this time.  An After Visit Summary was printed and given to the patient.  __15____ minutes face to face time of which over 50% was spent in counseling.

## 2017-09-15 LAB — AFP TUMOR MARKER: AFP, SERUM, TUMOR MARKER: 2.9 ng/mL (ref 0.0–8.3)

## 2017-09-15 LAB — HCG, SERUM, QUALITATIVE: hCG,Beta Subunit,Qual,Serum: NEGATIVE m[IU]/mL (ref <6)

## 2017-09-15 LAB — ESTRADIOL: Estradiol: 165.2 pg/mL

## 2017-09-15 LAB — CA 125: CANCER ANTIGEN (CA) 125: 8.5 U/mL (ref 0.0–38.1)

## 2017-09-15 LAB — LACTATE DEHYDROGENASE: LDH: 148 IU/L (ref 119–226)

## 2017-09-19 ENCOUNTER — Other Ambulatory Visit: Payer: Self-pay | Admitting: Obstetrics and Gynecology

## 2017-09-19 DIAGNOSIS — Z1231 Encounter for screening mammogram for malignant neoplasm of breast: Secondary | ICD-10-CM

## 2017-09-20 ENCOUNTER — Telehealth: Payer: Self-pay | Admitting: Obstetrics and Gynecology

## 2017-09-20 NOTE — Telephone Encounter (Signed)
Phone call to discuss plan regarding solid right ovarian area which has been stable.  She had complete tumor markers done which are all negative.   She does not want any surgical intervention.   Will plan for yearly pelvic US and she will call if she develops any right lower quadrant pain.

## 2017-10-17 ENCOUNTER — Ambulatory Visit
Admission: RE | Admit: 2017-10-17 | Discharge: 2017-10-17 | Disposition: A | Discharge status: Home / Self Care | Payer: 59 | Source: Ambulatory Visit | Attending: Obstetrics and Gynecology | Admitting: Obstetrics and Gynecology

## 2017-10-17 DIAGNOSIS — Z1231 Encounter for screening mammogram for malignant neoplasm of breast: Secondary | ICD-10-CM | POA: Diagnosis not present

## 2018-01-15 ENCOUNTER — Other Ambulatory Visit: Payer: Self-pay | Admitting: *Deleted

## 2018-01-15 NOTE — Telephone Encounter (Signed)
Medication refill request: vitamin d  Last AEX:  02/27/17 PG  Next AEX: 02/28/18  Last MMG (if hormonal medication request): n/a Refill authorized: 02/27/17 #30, 0RF. Today, please advise.

## 2018-01-16 NOTE — Telephone Encounter (Signed)
Detailed message left on mobile number per DPR, advising patient [name] will need to recheck vitamin d level prior to prescribing prescription strength vitamin d. Patient has aex scheduled for 02/28/18.

## 2018-02-10 ENCOUNTER — Other Ambulatory Visit: Payer: Self-pay | Admitting: Obstetrics and Gynecology

## 2018-02-12 NOTE — Telephone Encounter (Signed)
Medication refill request: OCP  Last AEX:  02-27-17  Next AEX: 02-28-18  Last MMG (if hormonal medication request): 10-17-17 WNL  Refill authorized: please advise

## 2018-02-28 ENCOUNTER — Ambulatory Visit: Payer: 59 | Admitting: Obstetrics and Gynecology

## 2018-03-02 ENCOUNTER — Ambulatory Visit: Payer: 59 | Admitting: Nurse Practitioner

## 2018-03-10 ENCOUNTER — Other Ambulatory Visit: Payer: Self-pay | Admitting: Obstetrics and Gynecology

## 2018-03-14 ENCOUNTER — Encounter: Payer: Self-pay | Admitting: Obstetrics and Gynecology

## 2018-03-14 ENCOUNTER — Other Ambulatory Visit: Payer: Self-pay

## 2018-03-14 ENCOUNTER — Ambulatory Visit: Payer: 59 | Admitting: Obstetrics and Gynecology

## 2018-03-14 ENCOUNTER — Telehealth: Payer: Self-pay | Admitting: Obstetrics and Gynecology

## 2018-03-14 VITALS — BP 118/78 | HR 68 | Resp 16 | Ht 63.5 in | Wt 194.0 lb

## 2018-03-14 DIAGNOSIS — R1310 Dysphagia, unspecified: Secondary | ICD-10-CM | POA: Diagnosis not present

## 2018-03-14 DIAGNOSIS — Z01419 Encounter for gynecological examination (general) (routine) without abnormal findings: Secondary | ICD-10-CM | POA: Diagnosis not present

## 2018-03-14 DIAGNOSIS — Z113 Encounter for screening for infections with a predominantly sexual mode of transmission: Secondary | ICD-10-CM

## 2018-03-14 DIAGNOSIS — Z3009 Encounter for other general counseling and advice on contraception: Secondary | ICD-10-CM | POA: Diagnosis not present

## 2018-03-14 MED ORDER — MISOPROSTOL 200 MCG PO TABS: ORAL_TABLET | ORAL | 0 refills | Status: DC

## 2018-03-14 MED ORDER — NORETHIN-ETH ESTRAD-FE BIPHAS 1 MG-10 MCG / 10 MCG PO TABS: 1.0000 | ORAL_TABLET | Freq: Every day | ORAL | 1 refills | Status: DC

## 2018-03-14 NOTE — Telephone Encounter (Signed)
Call placed to convey benefits. 

## 2018-03-14 NOTE — Progress Notes (Signed)
42 y.o. G0P0000 Single African American female here for annual exam.    Not taking combined OCPs this month. Misplaced pills. Needs more.  Without the pills, she is spotting with her Mirena IUD.  Does not always remember to take the pills.   IUD is due for exchange.  She really likes this and she cannot remember to take the pills. Had her last ultrasound to check her right ovarian cyst in Nov. 2018. IUD position was confirmed then.   Did not like NuvaRing due to infection and felt sore on the labia.  Did not like Depo Provera in college. OrthoEvra caused dermatitis of her skin.   No partner change.   Wants STD testing. (indicated this after her exam was done.)  Having some vomiting when she is eating bread or meat for a couple of weeks.  Feels like she is choking.   Works 12 hour shifts at [employer].  PCP: No PCP    Patient's last menstrual period was 03/09/2018.           Sexually active: Yes.    The current method of family planning is Mirena IUD inserted 04/11/13.    Exercising: No.  The patient does not participate in regular exercise at present. Smoker:  no  Health Maintenance: Pap:  02/22/16, Negative with neg HR HPV History of abnormal Pap:  no MMG:  10/17/17 BIRADS 1 negative/density b TDaP:  Up to date Gardasil:   no HIV: 02/22/16 Negative Hep C: never Screening Labs: discuss today   reports that she has never smoked. She has never used smokeless tobacco. She reports that she drinks about 0.6 - 1.2 oz of alcohol per week. She reports that she does not use drugs.  Past Medical History:  Diagnosis Date  . Bronchitis 10/2013  . Low vitamin D level   . Right ovarian cyst 2015   Calcified cyst - possible dermoid.  Needs yearly ultrasound.    Past Surgical History:  Procedure Laterality Date  . INTRAUTERINE DEVICE (IUD) INSERTION  04/11/13   Mirena    Current Outpatient Medications  Medication Sig Dispense Refill  . levonorgestrel (MIRENA) 20  MCG/24HR IUD 1 each by Intrauterine route once. Inserted 04/11/13    . Norethindrone-Ethinyl Estradiol-Fe Biphas (LO LOESTRIN FE) 1 MG-10 MCG / 10 MCG tablet Take 1 tablet by mouth daily. 3 Package 1   No current facility-administered medications for this visit.     Family History  Problem Relation Age of Onset  . Diabetes Mother   . Pancreatic cancer Paternal Aunt 23  . Breast cancer Paternal Aunt   . Diabetes Maternal Grandmother   . Diabetes Maternal Grandfather   . Breast cancer Paternal Grandmother 63       postmenopausal  . Hypertension Father   . Kidney failure Unknown 55    Review of Systems  Constitutional: Negative.   HENT: Negative.   Eyes: Negative.   Respiratory: Negative.   Cardiovascular: Negative.   Gastrointestinal: Negative.   Endocrine: Negative.   Genitourinary:       Spotting   Musculoskeletal: Negative.   Skin: Negative.   Allergic/Immunologic: Negative.   Neurological: Negative.   Hematological: Negative.   Psychiatric/Behavioral: Negative.     Exam:   BP 118/78 (BP Location: Right Arm, Patient Position: Sitting, Cuff Size: Large)   Pulse 68   Resp 16   Ht 5' 3.5" (1.613 m)   Wt 194 lb (88 kg)   LMP 03/09/2018   BMI 33.83  kg/m     General appearance: alert, cooperative and appears stated age Head: Normocephalic, without obvious abnormality, atraumatic Neck: no adenopathy, supple, symmetrical, trachea midline and thyroid normal to inspection and palpation Lungs: clear to auscultation bilaterally Breasts: normal appearance, no masses or tenderness, No nipple retraction or dimpling, No nipple discharge or bleeding, No axillary or supraclavicular adenopathy Heart: regular rate and rhythm Abdomen: soft, non-tender; no masses, no organomegaly Extremities: extremities normal, atraumatic, no cyanosis or edema Skin: Skin color, texture, turgor normal. No rashes or lesions Lymph nodes: Cervical, supraclavicular, and axillary nodes normal. No  abnormal inguinal nodes palpated Neurologic: Grossly normal  Pelvic: External genitalia:  no lesions              Urethra:  normal appearing urethra with no masses, tenderness or lesions              Bartholins and Skenes: normal                 Vagina: normal appearing vagina with normal color and discharge, no lesions              Cervix: no lesions.  IUD strings not seen.               Pap taken: No. Bimanual Exam:  Uterus:  normal size, contour, position, consistency, mobility, non-tender              Adnexa: no mass, fullness, tenderness              Rectal exam: Yes.  .  Confirms.              Anus:  normal sphincter tone, no lesions  Chaperone was present for exam.  Assessment:   Well woman visit with normal exam. Mirena IUD.  Persistent left ovarian cyst. Difficulty swallowing.   Plan: Mammogram screening. Recommended self breast awareness. Pap and HR HPV as above. Guidelines for Calcium, Vitamin D, regular exercise program including cardiovascular and weight bearing exercise. Discussed Gardasil.  Routine labs and serum STD testing.  Return for IUD exchange.  Will prescribe Cytotec 200 mcg night prior and am of procedure.  Already done.  Will do cervical STD testing with IUD exchange.  I did give her refills of her LoLoEstrin for 90 days and one refill.  I am hopeful she can stop taking these after we exchange her Mirena IUD.  Referral to PCP. Follow up annually and prn.   After visit summary provided.

## 2018-03-14 NOTE — Progress Notes (Signed)
Patient scheduled while in office with Dr. Betty Martinique at [facility] on 04/02/18 at 4:30pm. Patient declined earlier appointments offered. Patient provided with contact information, verbalizes understanding and is agreeable.

## 2018-03-14 NOTE — Patient Instructions (Signed)

## 2018-03-15 LAB — COMPREHENSIVE METABOLIC PANEL
ALBUMIN: 4.8 g/dL (ref 3.5–5.5)
ALT: 16 IU/L (ref 0–32)
AST: 18 IU/L (ref 0–40)
Albumin/Globulin Ratio: 1.5 (ref 1.2–2.2)
Alkaline Phosphatase: 64 IU/L (ref 39–117)
BUN / CREAT RATIO: 16 (ref 9–23)
BUN: 14 mg/dL (ref 6–24)
Bilirubin Total: 0.5 mg/dL (ref 0.0–1.2)
CO2: 19 mmol/L — AB (ref 20–29)
CREATININE: 0.85 mg/dL (ref 0.57–1.00)
Calcium: 9.5 mg/dL (ref 8.7–10.2)
Chloride: 104 mmol/L (ref 96–106)
GFR calc Af Amer: 99 mL/min/{1.73_m2} (ref >59)
GFR calc non Af Amer: 86 mL/min/{1.73_m2} (ref >59)
GLUCOSE: 82 mg/dL (ref 65–99)
Globulin, Total: 3.3 g/dL (ref 1.5–4.5)
Potassium: 4.5 mmol/L (ref 3.5–5.2)
Sodium: 140 mmol/L (ref 134–144)
Total Protein: 8.1 g/dL (ref 6.0–8.5)

## 2018-03-15 LAB — CBC
HEMOGLOBIN: 13.8 g/dL (ref 11.1–15.9)
Hematocrit: 41.3 % (ref 34.0–46.6)
MCH: 30.1 pg (ref 26.6–33.0)
MCHC: 33.4 g/dL (ref 31.5–35.7)
MCV: 90 fL (ref 79–97)
PLATELETS: 362 10*3/uL (ref 150–379)
RBC: 4.59 x10E6/uL (ref 3.77–5.28)
RDW: 13.5 % (ref 12.3–15.4)
WBC: 5.9 10*3/uL (ref 3.4–10.8)

## 2018-03-15 LAB — HEP, RPR, HIV PANEL
HIV SCREEN 4TH GENERATION: NONREACTIVE
Hepatitis B Surface Ag: NEGATIVE
RPR: NONREACTIVE

## 2018-03-15 LAB — LIPID PANEL
Chol/HDL Ratio: 4.3 ratio (ref 0.0–4.4)
Cholesterol, Total: 209 mg/dL — ABNORMAL HIGH (ref 100–199)
HDL: 49 mg/dL (ref >39)
LDL Calculated: 148 mg/dL — ABNORMAL HIGH (ref 0–99)
Triglycerides: 62 mg/dL (ref 0–149)
VLDL CHOLESTEROL CAL: 12 mg/dL (ref 5–40)

## 2018-03-15 LAB — TSH: TSH: 1.6 u[IU]/mL (ref 0.450–4.500)

## 2018-03-15 LAB — HEPATITIS C ANTIBODY: Hep C Virus Ab: <0.1 s/co ratio (ref 0.0–0.9)

## 2018-03-15 LAB — VITAMIN D 25 HYDROXY (VIT D DEFICIENCY, FRACTURES): VIT D 25 HYDROXY: 24.1 ng/mL — AB (ref 30.0–100.0)

## 2018-03-15 NOTE — Telephone Encounter (Signed)
Patient is returning a call to Rosa. °

## 2018-03-19 ENCOUNTER — Ambulatory Visit (INDEPENDENT_AMBULATORY_CARE_PROVIDER_SITE_OTHER): Payer: 59 | Admitting: Obstetrics and Gynecology

## 2018-03-19 ENCOUNTER — Encounter: Payer: Self-pay | Admitting: Obstetrics and Gynecology

## 2018-03-19 ENCOUNTER — Other Ambulatory Visit: Payer: Self-pay

## 2018-03-19 ENCOUNTER — Other Ambulatory Visit (HOSPITAL_COMMUNITY)
Admission: RE | Admit: 2018-03-19 | Discharge: 2018-03-19 | Disposition: A | Discharge status: Home / Self Care | Payer: 59 | Source: Ambulatory Visit | Attending: Obstetrics and Gynecology | Admitting: Obstetrics and Gynecology

## 2018-03-19 VITALS — BP 126/72 | HR 66 | Resp 14 | Ht 63.0 in | Wt 195.2 lb

## 2018-03-19 DIAGNOSIS — Z01812 Encounter for preprocedural laboratory examination: Secondary | ICD-10-CM

## 2018-03-19 DIAGNOSIS — Z30432 Encounter for removal of intrauterine contraceptive device: Secondary | ICD-10-CM | POA: Diagnosis not present

## 2018-03-19 DIAGNOSIS — Z3009 Encounter for other general counseling and advice on contraception: Secondary | ICD-10-CM

## 2018-03-19 DIAGNOSIS — Z30430 Encounter for insertion of intrauterine contraceptive device: Secondary | ICD-10-CM | POA: Diagnosis not present

## 2018-03-19 DIAGNOSIS — Z113 Encounter for screening for infections with a predominantly sexual mode of transmission: Secondary | ICD-10-CM | POA: Insufficient documentation

## 2018-03-19 LAB — POCT URINE PREGNANCY: PREG TEST UR: NEGATIVE

## 2018-03-19 NOTE — Patient Instructions (Signed)

## 2018-03-19 NOTE — Progress Notes (Signed)
GYNECOLOGY  VISIT   HPI: 42 y.o.   Single  African American  female   [place] with Patient's last menstrual period was 03/09/2018.   here for IUD exchange.  Took Cytotec last hs and this am.   UPT negative.   Has been taking LoLoestrin in addition to the [place] IUD in order to control her breakthrough bleeding.  Not always on time with her OCPs.  GYNECOLOGIC HISTORY: Patient's last menstrual period was 03/09/2018. Contraception:  Mirena IUD inserted 04/11/13 Menopausal hormone therapy:  n/a Last mammogram:  10/17/17 BIRADS 1 negative/density b Last pap smear:   02/22/16, Negative with neg HR HPV        OB History    Gravida  0   Para  0   Term  0   Preterm  0   AB  0   Living  0     SAB  0   TAB  0   Ectopic  0   Multiple  0   Live Births  0              Patient Active Problem List   Diagnosis Date Noted  . Bilateral ovarian cysts 06/10/2017  . IUD (intrauterine device) in place 05/17/2013  . Morbid obesity ([place]) 03/08/2013    Past Medical History:  Diagnosis Date  . Bronchitis 10/2013  . Low vitamin D level   . Right ovarian cyst 2015   Calcified cyst - possible dermoid.  Needs yearly ultrasound.    Past Surgical History:  Procedure Laterality Date  . INTRAUTERINE DEVICE (IUD) INSERTION  04/11/13   Mirena    Current Outpatient Medications  Medication Sig Dispense Refill  . levonorgestrel (MIRENA) 20 MCG/24HR IUD 1 each by Intrauterine route once. Inserted 04/11/13    . Norethindrone-Ethinyl Estradiol-Fe Biphas (LO LOESTRIN FE) 1 MG-10 MCG / 10 MCG tablet Take 1 tablet by mouth daily. 3 Package 1   No current facility-administered medications for this visit.      ALLERGIES: Amoxicillin  Family History  Problem Relation Age of Onset  . Diabetes Mother   . Pancreatic cancer Paternal Aunt 55  . Breast cancer Paternal Aunt   . Diabetes Maternal Grandmother   . Diabetes Maternal Grandfather   . Breast cancer Paternal Grandmother 24   postmenopausal  . Hypertension Father   . Kidney failure Unknown 3    Social History   Socioeconomic History  . Marital status: Single    Spouse name: Not on file  . Number of children: Not on file  . Years of education: Not on file  . Highest education level: Not on file  Occupational History  . Not on file  Social Needs  . Financial resource strain: Not on file  . Food insecurity:    Worry: Not on file    Inability: Not on file  . Transportation needs:    Medical: Not on file    Non-medical: Not on file  Tobacco Use  . Smoking status: Never Smoker  . Smokeless tobacco: Never Used  Substance and Sexual Activity  . Alcohol use: Yes    Alcohol/week: 0.6 - 1.2 oz    Types: 1 - 2 Standard drinks or equivalent per week  . Drug use: No  . Sexual activity: Yes    Partners: Male    Birth control/protection: IUD    Comment: Mirena Inserted 04/11/13  Lifestyle  . Physical activity:    Days per week: Not on file  Minutes per session: Not on file  . Stress: Not on file  Relationships  . Social connections:    Talks on phone: Not on file    Gets together: Not on file    Attends religious service: Not on file    Active member of club or organization: Not on file    Attends meetings of clubs or organizations: Not on file    Relationship status: Not on file  . Intimate partner violence:    Fear of current or ex partner: Not on file    Emotionally abused: Not on file    Physically abused: Not on file    Forced sexual activity: Not on file  Other Topics Concern  . Not on file  Social History Narrative  . Not on file    Review of Systems  Constitutional: Negative.   HENT: Negative.   Eyes: Negative.   Respiratory: Negative.   Cardiovascular: Negative.   Gastrointestinal: Negative.   Endocrine: Negative.   Genitourinary: Negative.   Musculoskeletal: Negative.   Skin: Negative.   Allergic/Immunologic: Negative.   Neurological: Negative.   Hematological: Negative.    Psychiatric/Behavioral: Negative.      PHYSICAL EXAMINATION:    BP 126/72 (BP Location: Right Arm, Patient Position: Sitting, Cuff Size: Normal)   Pulse 66   Resp 14   Ht 5\' 3"  (1.6 m)   Wt 195 lb 4 oz (88.6 kg)   LMP 03/09/2018   BMI 34.59 kg/m     General appearance: alert, cooperative and appears stated age    Pelvic: External genitalia:  no lesions              Urethra:  normal appearing urethra with no masses, tenderness or lesions              Bartholins and Skenes: normal                 Vagina: normal appearing vagina with normal color and discharge, no lesions              Cervix: no lesions.  Light menstrual flow noted.  IUD strings seen after testing for STD done with cytobrush.                 Bimanual Exam:  Uterus:  normal size, contour, position, consistency, mobility, non-tender              Adnexa: no mass, fullness, tenderness          IUD insertion - Mirena IUD, lot number [id number], expiration Oct 2021. Consent for procedure.  Sterile prep with Hibiclens.  IUD strings grasped with ring forceps and IUD removed intact, shown to patient, and discarded.  Paracervical block with 10 cc 1% lidocaine, lot number [id number], expiration 1/23. Tenaculum to anterior cervical lip.  Os finder used.  Uterus sounded to 7 cm.  Mirena IUD placed without difficulty.  Strings trimmed and shown to patient.  Repeat BM exam, no change.  Minimal EBL.  No complications.  Motrin 800 mg to patient.   Chaperone was present for exam.  ASSESSMENT  Mirena IUD removal and reinsertion of new IUD.  STD screening.   PLAN  FU testing for GC/CT/trichomonas.  Instructions and precautions given. Use back up protection for one week.  FU in 4 weeks.    An After Visit Summary was printed and given to the patient.

## 2018-03-20 LAB — CERVICOVAGINAL ANCILLARY ONLY
CHLAMYDIA, DNA PROBE: NEGATIVE
NEISSERIA GONORRHEA: NEGATIVE
TRICH (WINDOWPATH): NEGATIVE

## 2018-04-02 ENCOUNTER — Encounter: Payer: Self-pay | Admitting: Family Medicine

## 2018-04-02 ENCOUNTER — Ambulatory Visit: Payer: 59 | Admitting: Family Medicine

## 2018-04-02 VITALS — BP 124/76 | HR 75 | Temp 98.3°F | Resp 12 | Ht 63.0 in | Wt 198.1 lb

## 2018-04-02 DIAGNOSIS — K219 Gastro-esophageal reflux disease without esophagitis: Secondary | ICD-10-CM

## 2018-04-02 DIAGNOSIS — G4720 Circadian rhythm sleep disorder, unspecified type: Secondary | ICD-10-CM | POA: Diagnosis not present

## 2018-04-02 DIAGNOSIS — R1310 Dysphagia, unspecified: Secondary | ICD-10-CM | POA: Diagnosis not present

## 2018-04-02 DIAGNOSIS — E785 Hyperlipidemia, unspecified: Secondary | ICD-10-CM | POA: Diagnosis not present

## 2018-04-02 DIAGNOSIS — Z6835 Body mass index (BMI) 35.0-35.9, adult: Secondary | ICD-10-CM | POA: Diagnosis not present

## 2018-04-02 DIAGNOSIS — E6609 Other obesity due to excess calories: Secondary | ICD-10-CM

## 2018-04-02 MED ORDER — PANTOPRAZOLE SODIUM 20 MG PO TBEC: 20.0000 mg | DELAYED_RELEASE_TABLET | Freq: Two times a day (BID) | ORAL | 1 refills | Status: DC

## 2018-04-02 NOTE — Assessment & Plan Note (Signed)
Related to working schedule. We could try pharmacologic treatment but she has to dedicate at least 8 hours to sleep and there is the risk of side effects.  OTC Melatonin 3-5 mg may help. Good sleep hygiene as possible. Follow-up as needed.

## 2018-04-02 NOTE — Assessment & Plan Note (Signed)
Protonix 20 mg twice daily, 30 minutes before meals, recommended for 3 to 4 weeks.  She can decrease dose of Protonix to 20 mg daily if symptoms improved in 4 weeks. If there is no improvement we could either consider changing to Nexium and/or consider GI evaluation. We discussed some side effects of PPIs. GERD precautions also recommended.

## 2018-04-02 NOTE — Patient Instructions (Addendum)
A few things to remember from today's visit:   Gastroesophageal reflux disease, esophagitis presence not specified - Plan: pantoprazole (PROTONIX) 20 MG tablet  Circadian rhythm sleep disturbance   GERD:  Avoid foods that make your symptoms worse, for example coffee, chocolate,pepermeint,alcohol, and greasy food. Raising the head of your bed about 6 inches may help with nocturnal symptoms.   Weight loss (if you are overweight). Avoid lying down for 3 hours after eating.  Instead 3 large meals daily try small and more frequent meals during the day.  Every medication have side effects and medications for GERD are not the exception.At this time I think benefit is greater than risk.    You should be evaluated immediately if bloody vomiting, bloody stools, black stools (like tar), difficulty swallowing, food gets stuck on the way down or choking when eating. Abnormal weight loss or severe abdominal pain.  If symptoms are not resolved sometimes endoscopy is necessary.   Please be sure medication list is accurate. If a new problem present, please set up appointment sooner than planned today.

## 2018-04-02 NOTE — Progress Notes (Signed)
HPI:   Courtney Donaldson is a 42 y.o. female, who is here today to establish care.  Former PCP: Dr. Noah Delaine. Last preventive routine visit: She had her last gynecology preventive visit in 02/2018. Her gynecologist ordered lipid panel and fasting glucose. On 03/14/2018 TC 209, LDL 148, HDL 49, and TG 62. Fasting glucose 82. TSH 1.6.   Chronic medical problems: Reporting past history of depression, attributed to breaking-up with a boyfriend.  Ovarian cyst and obesity.  Negative for hypertension and DM.  Concerns today: Difficulty swallowing. For the past 2 weeks she has had dysphagia with solids, usually meat or bread.  When this happens she cannot swallow liquids, so she has to vomit to relieve obstruction.  She has no problem with eating smaller bites, snacking, or drinking fluids.  Negative for fever, chills, abnormal weight loss, night sweats, odynophagia, chest pain, dyspnea, wheezing, abdominal pain, melena, or blood in the stool. Positive for heartburn. No history of tobacco use. She tried OTC Prilosec 3 times and it seems to help.   She also mentions difficulty falling asleep. She changes work shift every 2 weeks, from 1st to 3rd and twice appears. She has tried OTC Unisom, which helps. She has not try prescription pharmacologic treatment before. She works 12 hours shift x 2 and 2 days off.   Review of Systems  Constitutional: Negative for activity change, appetite change, fatigue, fever and unexpected weight change.  HENT: Positive for trouble swallowing. Negative for mouth sores, sore throat and voice change.   Respiratory: Negative for cough, shortness of breath and wheezing.   Cardiovascular: Negative for chest pain and leg swelling.  Gastrointestinal: Negative for abdominal distention, abdominal pain, blood in stool, nausea and vomiting.       No changes in bowel habits.  Endocrine: Negative for cold intolerance and heat intolerance.  Genitourinary:  Negative for decreased urine volume and hematuria.  Musculoskeletal: Negative for back pain and myalgias.  Skin: Negative for rash and wound.  Neurological: Negative for syncope and weakness.  Psychiatric/Behavioral: Positive for sleep disturbance. Negative for confusion.      Current Outpatient Medications on File Prior to Visit  Medication Sig Dispense Refill  . levonorgestrel (MIRENA) 20 MCG/24HR IUD 1 each by Intrauterine route once. Inserted 04/11/13    . Norethindrone-Ethinyl Estradiol-Fe Biphas (LO LOESTRIN FE) 1 MG-10 MCG / 10 MCG tablet Take 1 tablet by mouth daily. 3 Package 1   No current facility-administered medications on file prior to visit.      Past Medical History:  Diagnosis Date  . Bronchitis 10/2013  . Depression   . Low vitamin D level   . Right ovarian cyst 2015   Calcified cyst - possible dermoid.  Needs yearly ultrasound.   Allergies  Allergen Reactions  . Amoxicillin Itching    Family History  Problem Relation Age of Onset  . Diabetes Mother   . Pancreatic cancer Paternal Aunt 89  . Breast cancer Paternal Aunt   . Diabetes Maternal Grandmother   . Diabetes Maternal Grandfather   . Cancer Maternal Grandfather   . Obesity Maternal Grandfather   . Breast cancer Paternal Grandmother 4       postmenopausal  . Cancer Paternal Grandmother   . Hypertension Father   . Early death Father   . Hyperlipidemia Father   . Kidney disease Father   . Depression Sister   . Hypertension Sister   . Heart attack Paternal Grandfather   . Kidney failure Unknown  58    Social History   Socioeconomic History  . Marital status: Single    Spouse name: Not on file  . Number of children: 0  . Years of education: Not on file  . Highest education level: Not on file  Occupational History  . Not on file  Social Needs  . Financial resource strain: Not on file  . Food insecurity:    Worry: Not on file    Inability: Not on file  . Transportation needs:     Medical: Not on file    Non-medical: Not on file  Tobacco Use  . Smoking status: Never Smoker  . Smokeless tobacco: Never Used  Substance and Sexual Activity  . Alcohol use: Yes    Alcohol/week: 0.6 - 1.2 oz    Types: 1 - 2 Standard drinks or equivalent per week  . Drug use: No  . Sexual activity: Yes    Partners: Male    Birth control/protection: IUD    Comment: Mirena Inserted 04/11/13  Lifestyle  . Physical activity:    Days per week: Not on file    Minutes per session: Not on file  . Stress: Not on file  Relationships  . Social connections:    Talks on phone: Not on file    Gets together: Not on file    Attends religious service: Not on file    Active member of club or organization: Not on file    Attends meetings of clubs or organizations: Not on file    Relationship status: Not on file  Other Topics Concern  . Not on file  Social History Narrative  . Not on file    Vitals:   04/02/18 1634  BP: 124/76  Pulse: 75  Resp: 12  Temp: 98.3 F (36.8 C)  SpO2: 97%    Body mass index is 35.1 kg/m.   Physical Exam  Nursing note and vitals reviewed. Constitutional: She is oriented to person, place, and time. She appears well-developed. No distress.  HENT:  Head: Normocephalic and atraumatic.  Mouth/Throat: Oropharynx is clear and moist and mucous membranes are normal.  Eyes: Pupils are equal, round, and reactive to light. Conjunctivae and EOM are normal.  Neck: No tracheal deviation present. No thyroid mass and no thyromegaly present.  Cardiovascular: Normal rate and regular rhythm.  No murmur heard. Pulses:      Dorsalis pedis pulses are 2+ on the right side, and 2+ on the left side.  Respiratory: Effort normal and breath sounds normal. No respiratory distress.  GI: Soft. She exhibits no mass. There is no hepatomegaly. There is no tenderness.  Musculoskeletal: She exhibits no edema or tenderness.  Lymphadenopathy:    She has no cervical adenopathy.    Neurological: She is alert and oriented to person, place, and time. She has normal strength. Gait normal.  Skin: Skin is warm. No erythema.  Psychiatric: She has a normal mood and affect.  Well groomed, good eye contact.    ASSESSMENT AND PLAN:   Ms. Siyah was seen today for establish care.  Diagnoses and all orders for this visit:  Dysphagia, unspecified type  We discussed possible etiologies. Most likely related to GERD. She will try PPI treatment, if problem does not improve or if it gets worse she is going to need EGD.   Class 2 obesity with body mass index (BMI) of 35.0 to 35.9 in adult She acknowledges that she needs to do better with her diet and regular exercise.  Weight loss is important for primary prevention of several chronic conditions. Consistency with any type of physical activity and a healthy diet will help.   GERD (gastroesophageal reflux disease) Protonix 20 mg twice daily, 30 minutes before meals, recommended for 3 to 4 weeks.  She can decrease dose of Protonix to 20 mg daily if symptoms improved in 4 weeks. If there is no improvement we could either consider changing to Nexium and/or consider GI evaluation. We discussed some side effects of PPIs. GERD precautions also recommended.   Circadian rhythm sleep disturbance Related to working schedule. We could try pharmacologic treatment but she has to dedicate at least 8 hours to sleep and there is the risk of side effects.  OTC Melatonin 3-5 mg may help. Good sleep hygiene as possible. Follow-up as needed.  Hyperlipidemia Mild. She can try nonpharmacologic treatment. Lipid panel can be ordered annually.     Betty G. Martinique, MD  [facility]. [place] office.

## 2018-04-02 NOTE — Assessment & Plan Note (Signed)
Mild. She can try nonpharmacologic treatment. Lipid panel can be ordered annually.

## 2018-04-02 NOTE — Assessment & Plan Note (Signed)
She acknowledges that she needs to do better with her diet and regular exercise. Weight loss is important for primary prevention of several chronic conditions. Consistency with any type of physical activity and a healthy diet will help.

## 2018-04-24 ENCOUNTER — Other Ambulatory Visit: Payer: Self-pay | Admitting: Family Medicine

## 2018-04-24 DIAGNOSIS — K219 Gastro-esophageal reflux disease without esophagitis: Secondary | ICD-10-CM

## 2018-04-26 ENCOUNTER — Ambulatory Visit (INDEPENDENT_AMBULATORY_CARE_PROVIDER_SITE_OTHER): Payer: 59 | Admitting: Obstetrics and Gynecology

## 2018-04-26 ENCOUNTER — Encounter: Payer: Self-pay | Admitting: Obstetrics and Gynecology

## 2018-04-26 ENCOUNTER — Other Ambulatory Visit: Payer: Self-pay

## 2018-04-26 VITALS — BP 128/86 | HR 72 | Resp 12 | Ht 63.0 in | Wt 197.0 lb

## 2018-04-26 DIAGNOSIS — N839 Noninflammatory disorder of ovary, fallopian tube and broad ligament, unspecified: Secondary | ICD-10-CM

## 2018-04-26 DIAGNOSIS — Z30431 Encounter for routine checking of intrauterine contraceptive device: Secondary | ICD-10-CM | POA: Diagnosis not present

## 2018-04-26 DIAGNOSIS — N838 Other noninflammatory disorders of ovary, fallopian tube and broad ligament: Secondary | ICD-10-CM

## 2018-04-26 NOTE — Progress Notes (Signed)
GYNECOLOGY  VISIT   HPI: 42 y.o.   Single  African American  female   [place] with Patient's last menstrual period was 04/04/2018.   here for   Mirena IUD recheck.  Had her IUD exchange done on 03/19/18.  No bleeding or spotting.  No pain with intercourse.   Still taking OCPs for cycle regulation due to irregular bleeding with the Mirena.  At the end of her pack now.   Negative EMB 05/2017.   GYNECOLOGIC HISTORY: Patient's last menstrual period was 04/04/2018. Contraception:  mirena IUD  Menopausal hormone therapy:  none Last mammogram:  10-17-17 density b/BIRADS 1 negative Last pap smear:   02-22-16 negative, HR HPV negative         OB History    Gravida  0   Para  0   Term  0   Preterm  0   AB  0   Living  0     SAB  0   TAB  0   Ectopic  0   Multiple  0   Live Births  0              Patient Active Problem List   Diagnosis Date Noted  . GERD (gastroesophageal reflux disease) 04/02/2018  . Circadian rhythm sleep disturbance 04/02/2018  . Hyperlipidemia 04/02/2018  . Bilateral ovarian cysts 06/10/2017  . IUD (intrauterine device) in place 05/17/2013  . Class 2 obesity with body mass index (BMI) of 35.0 to 35.9 in adult 03/08/2013    Past Medical History:  Diagnosis Date  . Bronchitis 10/2013  . Depression   . Low vitamin D level   . Right ovarian cyst 2015   Calcified cyst - possible dermoid.  Needs yearly ultrasound.    Past Surgical History:  Procedure Laterality Date  . INTRAUTERINE DEVICE (IUD) INSERTION  04/11/13   Mirena    Current Outpatient Medications  Medication Sig Dispense Refill  . levonorgestrel (MIRENA) 20 MCG/24HR IUD 1 each by Intrauterine route once. Inserted 04/11/13    . Norethindrone-Ethinyl Estradiol-Fe Biphas (LO LOESTRIN FE) 1 MG-10 MCG / 10 MCG tablet Take 1 tablet by mouth daily. 3 Package 1  . pantoprazole (PROTONIX) 20 MG tablet TAKE 1 TABLET (20 MG TOTAL) BY MOUTH 2 (TWO) TIMES DAILY BEFORE A MEAL. (Patient not  taking: Reported on 04/26/2018) 60 tablet 1   No current facility-administered medications for this visit.      ALLERGIES: Amoxicillin  Family History  Problem Relation Age of Onset  . Diabetes Mother   . Pancreatic cancer Paternal Aunt 97  . Breast cancer Paternal Aunt   . Diabetes Maternal Grandmother   . Diabetes Maternal Grandfather   . Cancer Maternal Grandfather   . Obesity Maternal Grandfather   . Breast cancer Paternal Grandmother 45       postmenopausal  . Cancer Paternal Grandmother   . Hypertension Father   . Early death Father   . Hyperlipidemia Father   . Kidney disease Father   . Depression Sister   . Hypertension Sister   . Heart attack Paternal Grandfather   . Kidney failure Unknown 65    Social History   Socioeconomic History  . Marital status: Single    Spouse name: Not on file  . Number of children: 0  . Years of education: Not on file  . Highest education level: Not on file  Occupational History  . Not on file  Social Needs  . Financial resource strain: Not on  file  . Food insecurity:    Worry: Not on file    Inability: Not on file  . Transportation needs:    Medical: Not on file    Non-medical: Not on file  Tobacco Use  . Smoking status: Never Smoker  . Smokeless tobacco: Never Used  Substance and Sexual Activity  . Alcohol use: Yes    Alcohol/week: 0.6 - 1.2 oz    Types: 1 - 2 Standard drinks or equivalent per week  . Drug use: No  . Sexual activity: Yes    Partners: Male    Birth control/protection: IUD    Comment: Mirena Inserted 04/11/13  Lifestyle  . Physical activity:    Days per week: Not on file    Minutes per session: Not on file  . Stress: Not on file  Relationships  . Social connections:    Talks on phone: Not on file    Gets together: Not on file    Attends religious service: Not on file    Active member of club or organization: Not on file    Attends meetings of clubs or organizations: Not on file    Relationship  status: Not on file  . Intimate partner violence:    Fear of current or ex partner: Not on file    Emotionally abused: Not on file    Physically abused: Not on file    Forced sexual activity: Not on file  Other Topics Concern  . Not on file  Social History Narrative  . Not on file    Review of Systems  Constitutional: Negative.   HENT: Negative.   Eyes: Negative.   Respiratory: Negative.   Cardiovascular: Negative.   Gastrointestinal: Negative.   Endocrine: Negative.   Genitourinary: Negative.   Musculoskeletal: Negative.   Skin: Negative.   Allergic/Immunologic: Negative.   Neurological: Negative.   Hematological: Negative.   Psychiatric/Behavioral: Negative.     PHYSICAL EXAMINATION:    BP 128/86 (BP Location: Right Arm, Patient Position: Sitting, Cuff Size: Normal)   Pulse 72   Resp 12   Ht 5\' 3"  (1.6 m)   Wt 197 lb (89.4 kg)   LMP 04/04/2018   BMI 34.90 kg/m     General appearance: alert, cooperative and appears stated age  Pelvic: External genitalia:  no lesions              Urethra:  normal appearing urethra with no masses, tenderness or lesions              Bartholins and Skenes: normal                 Vagina: normal appearing vagina with normal color and discharge, no lesions              Cervix: no lesions. IUD strings noted.                 Bimanual Exam:  Uterus:  normal size, contour, position, consistency, mobility, non-tender              Adnexa: no mass, fullness, tenderness             Chaperone was present for exam.  ASSESSMENT  Mirena IUD check up.  Hx breakthrough bleeding with prior Mirena IUD treated with concurrent COCs. EMB inactive endometrium.  Hx small solid right ovarian mass presumed to be a dermoid.  Negative tumor markers.  Followed with yearly ultrasound.  PLAN  Continue Mirena IUD. Will do a  trial of stopping OCPs at the end of this pack. FU for pelvic US November 2019.   An After Visit Summary was printed and given to  the patient.  _15_____ minutes face to face time of which over 50% was spent in counseling.

## 2018-04-28 DIAGNOSIS — N838 Other noninflammatory disorders of ovary, fallopian tube and broad ligament: Secondary | ICD-10-CM | POA: Insufficient documentation

## 2018-05-23 ENCOUNTER — Other Ambulatory Visit: Payer: Self-pay | Admitting: *Deleted

## 2018-05-23 DIAGNOSIS — K219 Gastro-esophageal reflux disease without esophagitis: Secondary | ICD-10-CM

## 2018-05-23 MED ORDER — PANTOPRAZOLE SODIUM 20 MG PO TBEC: 20.0000 mg | DELAYED_RELEASE_TABLET | Freq: Two times a day (BID) | ORAL | 2 refills | Status: DC

## 2018-07-09 ENCOUNTER — Telehealth: Payer: Self-pay | Admitting: Obstetrics and Gynecology

## 2018-07-09 NOTE — Telephone Encounter (Signed)
Patient had discontinued her lo loestrin and would like to start taking this again. Confirmed pharmacy on file.

## 2018-07-09 NOTE — Telephone Encounter (Signed)
Spoke with patient. Patient has Mirena IUD in place, did a trial of OCP for BTB. Stopped OCP as directed first of July. Cycles were still irregular when she stopped. BTB has continued, menses 8/25 and again on 9/8. Light flow, no heavy bleeding. Patient states "bleeding is just an inconvenience, asking to restart OCP".   Denies pain, SOB, weakness, lightheadedness, N/V, fever/chills.   Scheduled for f/u PUS 09/06/18.  Advised I will review with [name] and return call.   [name] -please advise.

## 2018-07-11 NOTE — Telephone Encounter (Signed)
Spoke with patient. She is agreeable to plan of care from [name].  Ultrasound for 09/2015 cancelled and patient offered appointment for ultrasound, scheduled for 9/10 with consult with [name].  She will be contacted with benefit information.  Encounter closed.

## 2018-07-11 NOTE — Telephone Encounter (Signed)
Please have patient return for a pelvic ultrasound and follow up visit with me for her bleeding.  It may be time to reassess her ovarian cyst.  This may be contributing to her irregular bleeding.

## 2018-07-12 ENCOUNTER — Ambulatory Visit (INDEPENDENT_AMBULATORY_CARE_PROVIDER_SITE_OTHER): Payer: 59 | Admitting: Obstetrics and Gynecology

## 2018-07-12 ENCOUNTER — Ambulatory Visit (INDEPENDENT_AMBULATORY_CARE_PROVIDER_SITE_OTHER): Payer: 59

## 2018-07-12 ENCOUNTER — Encounter: Payer: Self-pay | Admitting: Obstetrics and Gynecology

## 2018-07-12 VITALS — BP 120/82 | HR 76 | Resp 16 | Ht 63.0 in | Wt 196.6 lb

## 2018-07-12 DIAGNOSIS — N839 Noninflammatory disorder of ovary, fallopian tube and broad ligament, unspecified: Secondary | ICD-10-CM | POA: Diagnosis not present

## 2018-07-12 DIAGNOSIS — N838 Other noninflammatory disorders of ovary, fallopian tube and broad ligament: Secondary | ICD-10-CM

## 2018-07-12 DIAGNOSIS — N939 Abnormal uterine and vaginal bleeding, unspecified: Secondary | ICD-10-CM | POA: Diagnosis not present

## 2018-07-12 DIAGNOSIS — N83202 Unspecified ovarian cyst, left side: Secondary | ICD-10-CM

## 2018-07-12 DIAGNOSIS — D219 Benign neoplasm of connective and other soft tissue, unspecified: Secondary | ICD-10-CM | POA: Diagnosis not present

## 2018-07-12 DIAGNOSIS — N83201 Unspecified ovarian cyst, right side: Secondary | ICD-10-CM

## 2018-07-12 NOTE — Progress Notes (Signed)
GYNECOLOGY  VISIT   HPI: 42 y.o.   Single  African American  female   [place] with Patient's last menstrual period was 07/04/2018.   here for ultrasound following a left ovarian cyst, possible dermoid.   Asking to restart OCPs again which have helped her bleeding.  She has a long hx of irregular bleeding on contraception.  COCs did work for her in the past, and she only had one cycle per month when she uses them in addition to her Mirena. Has a Mirena IUD for hx of heavy menstruation.  She has been using Mirena for a long period of time.  Her Mirena was exchanged 03/19/18.  Negative EMB on 06/08/17.  Negative GC/CT on 05/31/18  Had abnormal uterine bleeding with Depo Provera.  Tried Nuvaring and had vulva irritation from this.  Tried [facility].   Had markers tested for her solid ovarian mass of right ovary.  Negative and normal CA125, AFP, hCG, LDH, estradiol on 09/14/17.   Declines future childbearing.   Does heavy lifting at work.  Works at [employer].  GYNECOLOGIC HISTORY: Patient's last menstrual period was 07/04/2018. Contraception:  Mirena IUD Menopausal hormone therapy:  n/a Last mammogram:  10-17-17 density b/BIRADS 1 negative Last pap smear:    02-22-16 negative, HR HPV negative        OB History    Gravida  0   Para  0   Term  0   Preterm  0   AB  0   Living  0     SAB  0   TAB  0   Ectopic  0   Multiple  0   Live Births  0              Patient Active Problem List   Diagnosis Date Noted  . Ovarian mass, right 04/28/2018  . Circadian rhythm sleep disturbance 04/02/2018  . Hyperlipidemia 04/02/2018  . IUD (intrauterine device) in place 05/17/2013  . Class 2 obesity with body mass index (BMI) of 35.0 to 35.9 in adult 03/08/2013    Past Medical History:  Diagnosis Date  . Bronchitis 10/2013  . Depression   . Low vitamin D level   . Right ovarian cyst 2015   Calcified cyst - possible dermoid.  Needs yearly ultrasound.    Past  Surgical History:  Procedure Laterality Date  . INTRAUTERINE DEVICE (IUD) INSERTION  04/11/13   Mirena    Current Outpatient Medications  Medication Sig Dispense Refill  . levonorgestrel (MIRENA) 20 MCG/24HR IUD 1 each by Intrauterine route once. Inserted 04/11/13     No current facility-administered medications for this visit.      ALLERGIES: Amoxicillin  Family History  Problem Relation Age of Onset  . Diabetes Mother   . Pancreatic cancer Paternal Aunt 49  . Breast cancer Paternal Aunt   . Diabetes Maternal Grandmother   . Diabetes Maternal Grandfather   . Cancer Maternal Grandfather   . Obesity Maternal Grandfather   . Breast cancer Paternal Grandmother 21       postmenopausal  . Cancer Paternal Grandmother   . Hypertension Father   . Early death Father   . Hyperlipidemia Father   . Kidney disease Father   . Depression Sister   . Hypertension Sister   . Heart attack Paternal Grandfather   . Kidney failure Unknown 17    Social History   Socioeconomic History  . Marital status: Single    Spouse name: Not  on file  . Number of children: 0  . Years of education: Not on file  . Highest education level: Not on file  Occupational History  . Not on file  Social Needs  . Financial resource strain: Not on file  . Food insecurity:    Worry: Not on file    Inability: Not on file  . Transportation needs:    Medical: Not on file    Non-medical: Not on file  Tobacco Use  . Smoking status: Never Smoker  . Smokeless tobacco: Never Used  Substance and Sexual Activity  . Alcohol use: Yes    Alcohol/week: 1.0 - 2.0 standard drinks    Types: 1 - 2 Standard drinks or equivalent per week  . Drug use: No  . Sexual activity: Yes    Partners: Male    Birth control/protection: IUD    Comment: Mirena Inserted 04/11/13  Lifestyle  . Physical activity:    Days per week: Not on file    Minutes per session: Not on file  . Stress: Not on file  Relationships  . Social  connections:    Talks on phone: Not on file    Gets together: Not on file    Attends religious service: Not on file    Active member of club or organization: Not on file    Attends meetings of clubs or organizations: Not on file    Relationship status: Not on file  . Intimate partner violence:    Fear of current or ex partner: Not on file    Emotionally abused: Not on file    Physically abused: Not on file    Forced sexual activity: Not on file  Other Topics Concern  . Not on file  Social History Narrative  . Not on file    Review of Systems  Genitourinary:       Unscheduled bleeding or spotting  All other systems reviewed and are negative.   PHYSICAL EXAMINATION:    BP 120/82 (BP Location: Left Arm, Patient Position: Sitting, Cuff Size: Normal)   Pulse 76   Resp 16   Ht 5\' 3"  (1.6 m)   Wt 196 lb 9.6 oz (89.2 kg)   LMP 07/04/2018   BMI 34.83 kg/m     General appearance: alert, cooperative and appears stated age   Pelvic US  Uterus with small fibroid.  Right ovary 25 mm cystic, calcified mass, avascular - c/w dermoid. Left ovary 26 mm, echo free, septum, avascular. No free fluid.   ASSESSMENT  Abnormal uterine bleeding on multiple forms of contraception.  Negative endometrial biopsy. Current Mirena IUD. Right ovarian dermoid cyst.  Left ovarian cyst. Uterine fibroid.  PLAN  I discussed with the patient proceeding with a laparoscopic right oophorectomy, left ovarian cystectomy, bilateral salpingectomy, and collection of pelvic washings.   I would also consider hysteroscopy with dilation and curettage. I did mention hysterectomy as means for treating her abnormal uterine bleeding definitively.  Risks of laparoscopy include but are not limited to bleeding, infection, damage to surrounding organs, reaction to anesthesia, pneumonia, DVT, PE, death, hernia formation, and need for further surgery. Reading material provided for laparoscopy and hysterectomy.  Patient  would like to consider her options.   An After Visit Summary was printed and given to the patient.  _25_____ minutes face to face time of which over 50% was spent in counseling.

## 2018-07-12 NOTE — Patient Instructions (Signed)
Ovarian Cyst An ovarian cyst is a fluid-filled sac that forms on an ovary. The ovaries are small organs that produce eggs in women. Various types of cysts can form on the ovaries. Some may cause symptoms and require treatment. Most ovarian cysts go away on their own, are not cancerous (are benign), and do not cause problems. Common types of ovarian cysts include:  Functional (follicle) cysts. ? Occur during the menstrual cycle, and usually go away with the next menstrual cycle if you do not get pregnant. ? Usually cause no symptoms.  Endometriomas. ? Are cysts that form from the tissue that lines the uterus (endometrium). ? Are sometimes called "chocolate cysts" because they become filled with blood that turns brown. ? Can cause pain in the lower abdomen during intercourse and during your period.  Cystadenoma cysts. ? Develop from cells on the outside surface of the ovary. ? Can get very large and cause lower abdomen pain and pain with intercourse. ? Can cause severe pain if they twist or break open (rupture).  Dermoid cysts. ? Are sometimes found in both ovaries. ? May contain different kinds of body tissue, such as skin, teeth, hair, or cartilage. ? Usually do not cause symptoms unless they get very big.  Theca lutein cysts. ? Occur when too much of a certain hormone (human chorionic gonadotropin) is produced and overstimulates the ovaries to produce an egg. ? Are most common after having procedures used to assist with the conception of a baby (in vitro fertilization).  What are the causes? Ovarian cysts may be caused by:  Ovarian hyperstimulation syndrome. This is a condition that can develop from taking fertility medicines. It causes multiple large ovarian cysts to form.  Polycystic ovarian syndrome (PCOS). This is a common hormonal disorder that can cause ovarian cysts, as well as problems with your period or fertility.  What increases the risk? The following factors may make  you more likely to develop ovarian cysts:  Being overweight or obese.  Taking fertility medicines.  Taking certain forms of hormonal birth control.  Smoking.  What are the signs or symptoms? Many ovarian cysts do not cause symptoms. If symptoms are present, they may include:  Pelvic pain or pressure.  Pain in the lower abdomen.  Pain during sex.  Abdominal swelling.  Abnormal menstrual periods.  Increasing pain with menstrual periods.  How is this diagnosed? These cysts are commonly found during a routine pelvic exam. You may have tests to find out more about the cyst, such as:  Ultrasound.  X-ray of the pelvis.  CT scan.  MRI.  Blood tests.  How is this treated? Many ovarian cysts go away on their own without treatment. Your health care provider may want to check your cyst regularly for 2-3 months to see if it changes. If you are in menopause, it is especially important to have your cyst monitored closely because menopausal women have a higher rate of ovarian cancer. When treatment is needed, it may include:  Medicines to help relieve pain.  A procedure to drain the cyst (aspiration).  Surgery to remove the whole cyst.  Hormone treatment or birth control pills. These methods are sometimes used to help dissolve a cyst.  Follow these instructions at home:  Take over-the-counter and prescription medicines only as told by your health care provider.  Do not drive or use heavy machinery while taking prescription pain medicine.  Get regular pelvic exams and Pap tests as often as told by your health care   provider.  Return to your normal activities as told by your health care provider. Ask your health care provider what activities are safe for you.  Do not use any products that contain nicotine or tobacco, such as cigarettes and e-cigarettes. If you need help quitting, ask your health care provider.  Keep all follow-up visits as told by your health care provider.  This is important. Contact a health care provider if:  Your periods are late, irregular, or painful, or they stop.  You have pelvic pain that does not go away.  You have pressure on your bladder or trouble emptying your bladder completely.  You have pain during sex.  You have any of the following in your abdomen: ? A feeling of fullness. ? Pressure. ? Discomfort. ? Pain that does not go away. ? Swelling.  You feel generally ill.  You become constipated.  You lose your appetite.  You develop severe acne.  You start to have more body hair and facial hair.  You are gaining weight or losing weight without changing your exercise and eating habits.  You think you may be pregnant. Get help right away if:  You have abdominal pain that is severe or gets worse.  You cannot eat or drink without vomiting.  You suddenly develop a fever.  Your menstrual period is much heavier than usual. This information is not intended to replace advice given to you by your health care provider. Make sure you discuss any questions you have with your health care provider. Document Released: 10/17/2005 Document Revised: 05/06/2016 Document Reviewed: 03/20/2016 Elsevier Interactive Patient Education  2018 [employer].   Diagnostic Laparoscopy, Care After Refer to this sheet in the next few weeks. These instructions provide you with information about caring for yourself after your procedure. Your health care provider may also give you more specific instructions. Your treatment has been planned according to current medical practices, but problems sometimes occur. Call your health care provider if you have any problems or questions after your procedure. What can I expect after the procedure? After your procedure, it is common to have mild discomfort in the throat and abdomen. Follow these instructions at home:  Take over-the-counter and prescription medicines only as told by your health care  provider.  Do not drive for 24 hours if you received a sedative.  Return to your normal activities as told by your health care provider.  Do not take baths, swim, or use a hot tub until your health care provider approves. You may shower.  Follow instructions from your health care provider about how to take care of your incision. Make sure you: ? Wash your hands with soap and water before you change your bandage (dressing). If soap and water are not available, use hand sanitizer. ? Change your dressing as told by your health care provider. ? Leave stitches (sutures), skin glue, or adhesive strips in place. These skin closures may need to stay in place for 2 weeks or longer. If adhesive strip edges start to loosen and curl up, you may trim the loose edges. Do not remove adhesive strips completely unless your health care provider tells you to do that.  Check your incision area every day for signs of infection. Check for: ? More redness, swelling, or pain. ? More fluid or blood. ? Warmth. ? Pus or a bad smell.  It is your responsibility to get the results of your procedure. Ask your health care provider or the department performing the procedure when  your results will be ready. Contact a health care provider if:  There is new pain in your shoulders.  You feel light-headed or faint.  You are unable to pass gas or unable to have a bowel movement.  You feel nauseous or you vomit.  You develop a rash.  You have more redness, swelling, or pain around your incision.  You have more fluid or blood coming from your incision.  Your incision feels warm to the touch.  You have pus or a bad smell coming from your incision.  You have a fever or chills. Get help right away if:  Your pain is getting worse.  You have ongoing vomiting.  The edges of your incision open up.  You have trouble breathing.  You have chest pain. This information is not intended to replace advice given to you  by your health care provider. Make sure you discuss any questions you have with your health care provider. Document Released: 09/28/2015 Document Revised: 03/24/2016 Document Reviewed: 06/30/2015 Elsevier Interactive Patient Education  2018 [employer].

## 2018-07-18 ENCOUNTER — Telehealth: Payer: Self-pay | Admitting: Obstetrics and Gynecology

## 2018-07-18 NOTE — Telephone Encounter (Signed)
Call placed to patient to review benefits for out patient surgery. Left voicemail message requesting a return call.   cc: Lamont Snowball, RN

## 2018-07-25 NOTE — Telephone Encounter (Signed)
Call to patient. Per ROI can leave message on voice mail which has name confirmation.  Left message calling to assist with scheduling procedure.  Request call back with update on plans to proceed.

## 2018-08-07 ENCOUNTER — Telehealth: Payer: Self-pay | Admitting: Obstetrics and Gynecology

## 2018-08-07 NOTE — Telephone Encounter (Signed)
Spoke with patient. Patient had oral surgery on 10/4, was started on amoxicillin. Reports thick, white, "cottage cheese" vaginal d/c, no odor, some discomfort. Symptoms started 2 days ago. Denies any other GYN symptoms, pain or bleeding. Request Rx for diflucan, has not tried anything OTC.   Recommended patient try OTC monistat, if symptoms do not resolve, return call to office for OV. Advised [name] will review, I will return call if any additional recommendations.  Routing to provider for final review. Patient is agreeable to disposition. Will close encounter.

## 2018-08-07 NOTE — Telephone Encounter (Signed)
Patient recently had oral surgery and was prescribed an antibiotic. Patient now has a yeast infection and is asking for diflucan to be called to her pharmacy.

## 2018-08-17 NOTE — Telephone Encounter (Signed)
Call to patient. Number confirmed on call log. No answer and no voice mail.

## 2018-09-06 ENCOUNTER — Other Ambulatory Visit: Payer: 59

## 2018-09-06 ENCOUNTER — Other Ambulatory Visit: Payer: 59 | Admitting: Obstetrics and Gynecology

## 2018-11-15 NOTE — Telephone Encounter (Signed)
Chart routed to Dr Quincy Simmonds for review.

## 2018-11-27 NOTE — Telephone Encounter (Signed)
Please contact patient and have her schedule a recheck with me.  We need to reassess her bleeding and I will review her ovarian cyst care as well.

## 2018-12-13 NOTE — Telephone Encounter (Signed)
Call to patient. States she had decided not to proceed with surgery. Advised Dr Quincy Simmonds would like to follow-up and reassess.  Patient agreeable. Appointment scheduled for tomorrow, 12-14-2018 with Dr Quincy Simmonds.   Encounter closed.

## 2018-12-14 ENCOUNTER — Other Ambulatory Visit: Payer: Self-pay

## 2018-12-14 ENCOUNTER — Ambulatory Visit: Payer: 59 | Admitting: Obstetrics and Gynecology

## 2018-12-14 VITALS — BP 120/64 | HR 66 | Resp 14 | Ht 63.25 in | Wt 197.0 lb

## 2018-12-14 DIAGNOSIS — N83201 Unspecified ovarian cyst, right side: Secondary | ICD-10-CM

## 2018-12-14 NOTE — Patient Instructions (Signed)
Diagnostic Laparoscopy Diagnostic laparoscopy is a procedure to diagnose diseases in the abdomen. It might be done for a variety of reasons, such as to look for scar tissue, cancer, or a reason for abdomen (abdominal) pain. During the procedure, a thin, flexible tube that has a light and a camera on the end (laparoscope) is inserted through an incision in the abdomen. The image from the camera is shown on a monitor to help your surgeon see inside your body. Tell a health care provider about:  Any allergies you have.  All medicines you are taking, including vitamins, herbs, eye drops, creams, and over-the-counter medicines.  Any problems you or family members have had with anesthetic medicines.  Any blood disorders you have.  Any surgeries you have had.  Any medical conditions you have. What are the risks? Generally, this is a safe procedure. However, problems may occur, including:  Infection.  Bleeding.  Allergic reactions to medicines or dyes.  Damage to abdominal structures or organs, such as the intestines, liver, stomach, or spleen. What happens before the procedure? Medicines  Ask your health care provider about: ? Changing or stopping your regular medicines. This is especially important if you are taking diabetes medicines or blood thinners. ? Taking medicines such as aspirin and ibuprofen. These medicines can thin your blood. Do not take these medicines unless your health care provider tells you to take them. ? Taking over-the-counter medicines, vitamins, herbs, and supplements.  You may be given antibiotic medicine to help prevent infection. Staying hydrated Follow instructions from your health care provider about hydration, which may include:  Up to 2 hours before the procedure - you may continue to drink clear liquids, such as water, clear fruit juice, black coffee, and plain tea. Eating and drinking restrictions Follow instructions from your health care provider  about eating and drinking, which may include:  8 hours before the procedure - stop eating heavy meals or foods such as meat, fried foods, or fatty foods.  6 hours before the procedure - stop eating light meals or foods, such as toast or cereal.  6 hours before the procedure - stop drinking milk or drinks that contain milk.  2 hours before the procedure - stop drinking clear liquids. General instructions  Ask your health care provider how your surgical site will be marked or identified.  You may be asked to shower with a germ-killing soap.  Plan to have someone take you home from the hospital or clinic.  Plan to have a responsible adult care for you for at least 24 hours after you leave the hospital or clinic. This is important. What happens during the procedure?   To lower your risk of infection: ? Your health care team will wash or sanitize their hands. ? Hair may be removed from the surgical area. ? Your skin will be washed with soap.  An IV will be inserted into one of your veins.  You will be given a medicine to make you fall asleep (general anesthetic). You may also be given a medicine to help you relax (sedative).  A breathing tube will be placed down your throat to help you breathe during the procedure.  Your abdomen will be filled with an air-like gas so it expands. This will give the surgeon more room to operate and will make your organs easier to see.  Many small incisions will be made in your abdomen.  A laparoscope and other surgical instruments will be inserted into your abdomen through the   incisions.  A tissue sample may be removed from an organ for examination (biopsy). This will depend on the reason why you are having this procedure.  The laparoscope and other instruments will be removed from your abdomen.  The gas will be released.  Your incisions will be closed with stitches (sutures) and covered with a bandage (dressing).  Your breathing tube will be  removed. The procedure may vary among health care providers and hospitals. What happens after the procedure?   Your blood pressure, heart rate, breathing rate, and blood oxygen level will be monitored until the medicines you were given have worn off.  Do not drive for 24 hours if you were given a sedative during your procedure.  It is up to you to get the results of your procedure. Ask your health care provider, or the department that is doing the procedure, when your results will be ready. Summary  Diagnostic laparoscopy is a way to look for problems in the abdomen using small incisions.  Follow instructions from your health care provider about how to prepare for the procedure.  Plan to have a responsible adult care for you for at least 24 hours after you leave the hospital or clinic. This is important. This information is not intended to replace advice given to you by your health care provider. Make sure you discuss any questions you have with your health care provider. Document Released: 01/23/2001 Document Revised: 09/14/2017 Document Reviewed: 04/12/2017 Elsevier Interactive Patient Education  2019 [employer]  

## 2018-12-14 NOTE — Progress Notes (Signed)
GYNECOLOGY  VISIT   HPI: 43 y.o.   Single  African American  female   Nicholas with Patient's last menstrual period was 12/03/2018.   here for  ovarian cysts follow up. She also stated that her last menstrual cycle was much heavier than normal. She feels this is due to stress.   Patient is followed with observational management with yearly pelvic US for a persistent right ovarian cyst thought to be consistent with a dermoid.  Her last pelvic US was 07/12/18 and the right ovarian cyst measured 25 mm with calcifications.  She also had a simple left ovarian cyst 26 mm.  She has a small anterior fibroid and her IUD is in the correct position in the endometrial canal. No free fluid was seen. She has had negative AFP, hCG, LDH, and CA125 on 09/14/17 due to the more solid nature of her cyst.   She is having clotting with her last menstruation.   Menses can be back to back per patient.  For example, bleed for one week, stop for 2 weeks and then start again.  Had period in December, but does not remember the date. January cycle - 1/15 - 1/23 February cycle - 2/3 - 2/9.  Passed clots.  Heavy first 3 days.  Pad change every 2 hours.  Cramping for the first couple of days of her cycle.  Also having bleeding after intercourse.  Starts bleeding one or two days later.   Likes the Technical sales engineer.  Tried Nuvaring, Ortho Evra, Depo Provera.  Skin sensitive to the patch and ring.   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  Stressful change.  GYNECOLOGIC HISTORY: Patient's last menstrual period was 12/03/2018. Contraception: Mirena IUD 03-19-18 Menopausal hormone therapy:  none Last mammogram: 10-17-17 density b/BIRADS 1 negative Last pap smear: 02-22-16 negative, HR HPV negative        OB History    Gravida  0   Para  0   Term  0   Preterm  0   AB  0   Living  0     SAB  0   TAB  0   Ectopic  0   Multiple  0   Live Births  0              Patient Active  Problem List   Diagnosis Date Noted  . Ovarian mass, right 04/28/2018  . Circadian rhythm sleep disturbance 04/02/2018  . Hyperlipidemia 04/02/2018  . IUD (intrauterine device) in place 05/17/2013  . Class 2 obesity with body mass index (BMI) of 35.0 to 35.9 in adult 03/08/2013    Past Medical History:  Diagnosis Date  . Bronchitis 10/2013  . Depression   . Low vitamin D level   . Right ovarian cyst 2015   Calcified cyst - possible dermoid.  Needs yearly ultrasound.    Past Surgical History:  Procedure Laterality Date  . INTRAUTERINE DEVICE (IUD) INSERTION  04/11/13   Mirena    Current Outpatient Medications  Medication Sig Dispense Refill  . levonorgestrel (MIRENA) 20 MCG/24HR IUD 1 each by Intrauterine route once. Inserted 04/11/13     No current facility-administered medications for this visit.      ALLERGIES: Amoxicillin  Family History  Problem Relation Age of Onset  . Diabetes Mother   . Pancreatic cancer Paternal Aunt 60  . Breast cancer Paternal Aunt   . Diabetes Maternal Grandmother   . Diabetes Maternal Grandfather   .  Cancer Maternal Grandfather   . Obesity Maternal Grandfather   . Breast cancer Paternal Grandmother 25       postmenopausal  . Cancer Paternal Grandmother   . Hypertension Father   . Early death Father   . Hyperlipidemia Father   . Kidney disease Father   . Depression Sister   . Hypertension Sister   . Heart attack Paternal Grandfather   . Kidney failure Unknown 13    Social History   Socioeconomic History  . Marital status: Single    Spouse name: Not on file  . Number of children: 0  . Years of education: Not on file  . Highest education level: Not on file  Occupational History  . Not on file  Social Needs  . Financial resource strain: Not on file  . Food insecurity:    Worry: Not on file    Inability: Not on file  . Transportation needs:    Medical: Not on file    Non-medical: Not on file  Tobacco Use  . Smoking  status: Never Smoker  . Smokeless tobacco: Never Used  Substance and Sexual Activity  . Alcohol use: Yes    Alcohol/week: 1.0 - 2.0 standard drinks    Types: 1 - 2 Standard drinks or equivalent per week  . Drug use: No  . Sexual activity: Yes    Partners: Male    Birth control/protection: I.U.D.    Comment: Mirena Inserted 04/11/13  Lifestyle  . Physical activity:    Days per week: Not on file    Minutes per session: Not on file  . Stress: Not on file  Relationships  . Social connections:    Talks on phone: Not on file    Gets together: Not on file    Attends religious service: Not on file    Active member of club or organization: Not on file    Attends meetings of clubs or organizations: Not on file    Relationship status: Not on file  . Intimate partner violence:    Fear of current or ex partner: Not on file    Emotionally abused: Not on file    Physically abused: Not on file    Forced sexual activity: Not on file  Other Topics Concern  . Not on file  Social History Narrative  . Not on file    Review of Systems  Genitourinary: Positive for vaginal bleeding.    PHYSICAL EXAMINATION:    BP 120/64   Pulse 66   Resp 14   Ht 5' 3.25" (1.607 m)   Wt 197 lb (89.4 kg)   LMP 12/03/2018   BMI 34.62 kg/m     General appearance: alert, cooperative and appears stated age    Pelvic: External genitalia:  no lesions              Urethra:  normal appearing urethra with no masses, tenderness or lesions              Bartholins and Skenes: normal                 Vagina: normal appearing vagina with normal color and discharge, no lesions              Cervix: no lesions,  IUD strings confirmed.                 Bimanual Exam:  Uterus:  normal size, contour, position, consistency, mobility, non-tender  Adnexa: no mass, fullness, tenderness                Chaperone was present for exam.  ASSESSMENT  Mirena IUD.  Frequent menses. Right ovarian cyst consistent  with possible dermoid.  Left ovarian simple cyst.    PLAN  We reviewed dermoids in Up to Date and 2% risk of malignant transformation of dermoid cyst.  We reviewed laparoscopy with oophorectomy and collection of pelvic washings versus laparoscopic hysterectomy with bilateral salpingectomy, right oophorectomy, and possible left oophorectomy if abnormal.  Risks of surgical procedures reviewed - bleeding, infection, damage to surrounding organs, reaction to anesthesia, pneumonia, DVT, PE, death, reoperation, 10% bilaterality to dermoids. Patient expresses appreciation for the visit today and the information shared.   We will call patient for follow up in one week.    An After Visit Summary was printed and given to the patient.  __40____ minutes face to face time of which over 50% was spent in counseling.

## 2018-12-15 ENCOUNTER — Encounter: Payer: Self-pay | Admitting: Obstetrics and Gynecology

## 2018-12-27 ENCOUNTER — Telehealth: Payer: Self-pay | Admitting: *Deleted

## 2018-12-27 NOTE — Telephone Encounter (Signed)
Follow-up call to patient. Per ROI, can leave message on voice mail which has name confirmation.  Left message that we are following up regarding plans for surgery. Requested call back with update.

## 2019-02-26 ENCOUNTER — Encounter: Payer: Self-pay | Admitting: Family Medicine

## 2019-02-26 ENCOUNTER — Ambulatory Visit: Payer: Self-pay

## 2019-02-26 ENCOUNTER — Ambulatory Visit (INDEPENDENT_AMBULATORY_CARE_PROVIDER_SITE_OTHER): Payer: 59 | Admitting: Family Medicine

## 2019-02-26 ENCOUNTER — Encounter: Payer: Self-pay | Admitting: *Deleted

## 2019-02-26 ENCOUNTER — Other Ambulatory Visit: Payer: Self-pay

## 2019-02-26 VITALS — Resp 12

## 2019-02-26 DIAGNOSIS — J309 Allergic rhinitis, unspecified: Secondary | ICD-10-CM | POA: Insufficient documentation

## 2019-02-26 DIAGNOSIS — K219 Gastro-esophageal reflux disease without esophagitis: Secondary | ICD-10-CM

## 2019-02-26 DIAGNOSIS — R509 Fever, unspecified: Secondary | ICD-10-CM | POA: Diagnosis not present

## 2019-02-26 DIAGNOSIS — R053 Chronic cough: Secondary | ICD-10-CM | POA: Insufficient documentation

## 2019-02-26 DIAGNOSIS — J3089 Other allergic rhinitis: Secondary | ICD-10-CM

## 2019-02-26 DIAGNOSIS — R05 Cough: Secondary | ICD-10-CM

## 2019-02-26 MED ORDER — PANTOPRAZOLE SODIUM 20 MG PO TBEC: 20.0000 mg | DELAYED_RELEASE_TABLET | Freq: Two times a day (BID) | ORAL | 3 refills | Status: DC

## 2019-02-26 MED ORDER — FLUTICASONE PROPIONATE 50 MCG/ACT NA SUSP: 2.0000 | Freq: Every day | NASAL | 6 refills | Status: DC

## 2019-02-26 NOTE — Telephone Encounter (Signed)
Pt. Called COVID Nurse Line.  Reported she went to work today, had temperature checked and advised she had a fever, and needed to see her PCP, to be approved to return to work.  Pt. Was not informed of what her temperature was.  C/o "chronic cough since January or February."  Denied shortness of breath, chest tightness, body aches, or any other symptoms.  Stated she had bronchitis about a year ago, and has been using an inhaler for the cough; "Symbicort".  Denied travel within past 14 days; denied known exposure to someone positive for Coronavirus.  Works as Scientist, product/process development at [employer].  Called FC; transferred to the office to be scheduled for virtual visit with PCP.  Agreed with plan.   Reason for Disposition . [1] Continuous (nonstop) coughing interferes with work or school AND [2] no improvement using cough treatment per protocol  Answer Assessment - Initial Assessment Questions 1. COVID-19 DIAGNOSIS: "Who made your Coronavirus (COVID-19) diagnosis?" "Was it confirmed by a positive lab test?" If not diagnosed by a HCP, ask "Are there lots of cases (community spread) where you live?" (See public health department website, if unsure)   * MAJOR community spread: high number of cases; numbers of cases are increasing; many people hospitalized.   * MINOR community spread: low number of cases; not increasing; few or no people hospitalized     Present in community 2. ONSET: "When did the COVID-19 symptoms start?"      Onset of fever today 3. WORST SYMPTOM: "What is your worst symptom?" (e.g., cough, fever, shortness of breath, muscle aches)     Denied feeling ill at all  4. COUGH: "How bad is the cough?"       Cough is chronic since January or February 5. FEVER: "Do you have a fever?" If so, ask: "What is your temperature, how was it measured, and when did it start?"     Was told by employer she had fever; was not advised of reading  6. RESPIRATORY STATUS: "Describe your breathing?" (e.g.,  shortness of breath, wheezing, unable to speak)      Denied any breathing issues 7. BETTER-SAME-WORSE: "Are you getting better, staying the same or getting worse compared to yesterday?"  If getting worse, ask, "In what way?"     Denied feeling ill 8. HIGH RISK DISEASE: "Do you have any chronic medical problems?" (e.g., asthma, heart or lung disease, weak immune system, etc.)     Denied any chronic illness; feels she is healthy 9. PREGNANCY: "Is there any chance you are pregnant?" "When was your last menstrual period?"     On menstrual cycle now 10. OTHER SYMPTOMS: "Do you have any other symptoms?"  (e.g., runny nose, headache, sore throat, loss of smell)       Denied any symptoms other than chronic cough  Protocols used: CORONAVIRUS (COVID-19) DIAGNOSED OR SUSPECTED-A-AH

## 2019-02-26 NOTE — Assessment & Plan Note (Signed)
Symptoms are not well controlled. She does not want to take oral antihistaminic. Recommend Flonase nasal spray daily as needed. Nasal irrigation with saline as needed.

## 2019-02-26 NOTE — Progress Notes (Signed)
Virtual Visit via Video Note   I connected with Ms Mondry on 02/26/19 at 10:30 AM EDT by a video enabled telemedicine application and verified that I am speaking with the correct person using two identifiers.  Location patient: home Location provider:home office Persons participating in the virtual visit: patient, provider  I discussed the limitations of evaluation and management by telemedicine and the availability of in person appointments. She expressed understanding and agreed to proceed.   HPI: Ms Leitz is a 43 yo female, who is concerned about fever found at work today. As part of daily screening for COVID-19 symptoms, she was told she had fever but was not told about temp, checked x 3 times. 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 In general she does not feel sick.  Negative for sick contact or recent travel.  Noted a few episodes of cough during visit, she states that she "always cough", reporting problem as a stable.  Nasal congestion or rhinorrhea, chest pain symptoms are related with allergies. Denies headache,fatigue,soe throat, or body aches. She is not taking OTC medications.  Also Hx of GERD, she discontinued Pantoprazole, no side effects. She has had heartburn, exacerbated by certain food intake, takes OTC Tums.  Denies dysphagia,abdominal pain, nausea, vomiting, changes in bowel habits, or melena.   ROS: See pertinent positives and negatives per HPI. COVID-19 screening questions: Negative for possible exposure to COVID-19. Negative for loss in the sense of smell or taste.  Past Medical History:  Diagnosis Date  . Bronchitis 10/2013  . Depression   . Low vitamin D level   . Right ovarian cyst 2015   Calcified cyst - possible dermoid.  Needs yearly ultrasound.    Past Surgical History:  Procedure Laterality Date  . INTRAUTERINE DEVICE (IUD) INSERTION  04/11/13   Mirena    Family History  Problem  Relation Age of Onset  . Diabetes Mother   . Pancreatic cancer Paternal Aunt 11  . Breast cancer Paternal Aunt   . Diabetes Maternal Grandmother   . Diabetes Maternal Grandfather   . Cancer Maternal Grandfather   . Obesity Maternal Grandfather   . Breast cancer Paternal Grandmother 72       postmenopausal  . Cancer Paternal Grandmother   . Hypertension Father   . Early death Father   . Hyperlipidemia Father   . Kidney disease Father   . Depression Sister   . Hypertension Sister   . Heart attack Paternal Grandfather   . Kidney failure Unknown 86    Social History   Socioeconomic History  . Marital status: Single    Spouse name: Not on file  . Number of children: 0  . Years of education: Not on file  . Highest education level: Not on file  Occupational History  . Not on file  Social Needs  . Financial resource strain: Not on file  . Food insecurity:    Worry: Not on file    Inability: Not on file  . Transportation needs:    Medical: Not on file    Non-medical: Not on file  Tobacco Use  . Smoking status: Never Smoker  . Smokeless tobacco: Never Used  Substance and Sexual Activity  . Alcohol use: Yes    Alcohol/week: 1.0 - 2.0 standard drinks    Types: 1 - 2 Standard drinks or equivalent per week  . Drug use: No  . Sexual activity: Yes    Partners: Male  Birth control/protection: I.U.D.    Comment: Mirena Inserted 04/11/13  Lifestyle  . Physical activity:    Days per week: Not on file    Minutes per session: Not on file  . Stress: Not on file  Relationships  . Social connections:    Talks on phone: Not on file    Gets together: Not on file    Attends religious service: Not on file    Active member of club or organization: Not on file    Attends meetings of clubs or organizations: Not on file    Relationship status: Not on file  . Intimate partner violence:    Fear of current or ex partner: Not on file    Emotionally abused: Not on file    Physically  abused: Not on file    Forced sexual activity: Not on file  Other Topics Concern  . Not on file  Social History Narrative  . Not on file      Current Outpatient Medications:  .  fluticasone (FLONASE) 50 MCG/ACT nasal spray, Place 2 sprays into both nostrils daily., Disp: 16 g, Rfl: 6 .  levonorgestrel (MIRENA) 20 MCG/24HR IUD, 1 each by Intrauterine route once. Inserted 04/11/13, Disp: , Rfl:  .  pantoprazole (PROTONIX) 20 MG tablet, Take 1 tablet (20 mg total) by mouth 2 (two) times daily before a meal., Disp: 60 tablet, Rfl: 3  EXAM:  VITALS per patient if applicable:Resp 12   GENERAL: alert, oriented, appears well and in no acute distress  HEENT: atraumatic, conjunttiva clear, no obvious abnormalities on inspection of external nose and ears  NECK: normal movements of the head and neck  LUNGS: on inspection no signs of respiratory distress, breathing rate appears normal, no obvious gross SOB, gasping or wheezing. A few episodes of non productive cough.  CV: no obvious cyanosis  MS: moves all visible extremities without noticeable abnormality  PSYCH/NEURO: pleasant and cooperative, no obvious depression or anxiety, speech and thought processing grossly intact  ASSESSMENT AND PLAN:  Discussed the following assessment and plan:  Fever, unspecified Recommend checking overall temperature daily. Monitor for new symptoms. She does not want 14 days of quarantine because she does not feel sick. We agreed on 7 days at home, keeping social distance from sister, who lives with her. If no fever or new symptoms in 7 days she can go back to work. She voices understanding and agrees with plan.  GERD (gastroesophageal reflux disease) Symptomatic. This problem could also be contributing to persistent cough. Recommend resuming pantoprazole 20 mg 30 minutes before breakfast and supper. GERD precautions.  Allergic rhinitis Symptoms are not well controlled. She does not want to take  oral antihistaminic. Recommend Flonase nasal spray daily as needed. Nasal irrigation with saline as needed.  Cough, persistent This is a chronic problem. We discussed possible etiologies. Allergy rhinitis and GERD, both not well controlled, could be contributing to symptoms. Given the fact that problem has been stable for a while, I do not think imaging is needed today. Instructed about warning signs.    I discussed the assessment and treatment plan with the patient. She was provided an opportunity to ask questions and all were answered. The patient agreed with the plan and demonstrated an understanding of the instructions.   The patient was advised to call back or seek an in-person evaluation if the symptoms worsen or if the condition fails to improve as anticipated.  Return if symptoms worsen or fail to improve.  Frandy Basnett [place], MD

## 2019-02-26 NOTE — Assessment & Plan Note (Signed)
This is a chronic problem. We discussed possible etiologies. Allergy rhinitis and GERD, both not well controlled, could be contributing to symptoms. Given the fact that problem has been stable for a while, I do not think imaging is needed today. Instructed about warning signs.

## 2019-02-26 NOTE — Assessment & Plan Note (Signed)
Symptomatic. This problem could also be contributing to persistent cough. Recommend resuming pantoprazole 20 mg 30 minutes before breakfast and supper. GERD precautions.

## 2019-03-07 ENCOUNTER — Telehealth: Payer: Self-pay | Admitting: Obstetrics and Gynecology

## 2019-03-07 NOTE — Telephone Encounter (Signed)
Spoke with patient. Requesting OV to discuss irregular menses with [name]. Reports menses has been on for 17 days, has mirena IUD. Describes flow as "medium, not heavy", changing non saturated regular pad q1-2hrs. Denies pain, fatigue, lightheadedness, dizziness, weakness, SOB, N/V, fever/chills. Has not been SA in the last month.  Advised patient OV needed for further evaluation, offered OV on 5/8 at 3:30pm with [name], patient declined. OV scheduled for 5/12 at 10:30am with [name]. Advised patient should bleeding become heavy, changing saturated pad q1-2 hrs or any new symptoms develop need to return call to office for earlier OV, if after hours [place]. Advised [name] will review, our office will return call if any additional recommendations, patient agreeable.   Routing to provider for final review. Patient is agreeable to disposition. Will close encounter.

## 2019-03-07 NOTE — Telephone Encounter (Signed)
Patient is asking to discuss her irregular cycles with Dr,Silva.

## 2019-03-11 ENCOUNTER — Other Ambulatory Visit: Payer: Self-pay

## 2019-03-12 ENCOUNTER — Encounter: Payer: Self-pay | Admitting: Obstetrics and Gynecology

## 2019-03-12 ENCOUNTER — Ambulatory Visit: Payer: 59 | Admitting: Obstetrics and Gynecology

## 2019-03-12 ENCOUNTER — Telehealth: Payer: Self-pay

## 2019-03-12 VITALS — BP 112/72 | HR 72 | Temp 97.5°F | Resp 12 | Ht 64.0 in | Wt 194.0 lb

## 2019-03-12 DIAGNOSIS — D369 Benign neoplasm, unspecified site: Secondary | ICD-10-CM | POA: Diagnosis not present

## 2019-03-12 DIAGNOSIS — N921 Excessive and frequent menstruation with irregular cycle: Secondary | ICD-10-CM | POA: Diagnosis not present

## 2019-03-12 DIAGNOSIS — N926 Irregular menstruation, unspecified: Secondary | ICD-10-CM

## 2019-03-12 LAB — POCT URINE PREGNANCY: Preg Test, Ur: NEGATIVE

## 2019-03-12 MED ORDER — NORETHINDRONE ACETATE 5 MG PO TABS: 5.0000 mg | ORAL_TABLET | Freq: Every day | ORAL | 1 refills | Status: DC

## 2019-03-12 NOTE — Telephone Encounter (Signed)
-----   Message from Nunzio Cobbs, MD sent at 03/12/2019 11:30 AM EDT ----- Regarding: patient needs ultrasound done without using gel When she has her ultrasound done, I will also need to do a pap.   Please no gel that day for the [place].  Thanks,   [employer]

## 2019-03-12 NOTE — Progress Notes (Signed)
negativeGYNECOLOGY  VISIT   HPI: 43 y.o.   Single  African American  female   [place] with Patient's last menstrual period was 02/21/2019.here for heavy bleeding since 02/21/19 with clots. Per patient no pain. Patient would like to discuss surgical options that were previously discussed at last visit.  She is interested in hysterectomy.  States her bleed is bright red with clots.  Changing her pad every 2 hours.  Denies dizziness, lightheadedness, headache, fatigue, or shortness of breath.   Patient is followed with observational management with yearly pelvic US for a persistent right ovarian cyst thought to be consistent with a dermoid.  Her last pelvic US was 07/12/18 and the right ovarian cyst measured 25 mm with calcifications.  She also had a simple left ovarian cyst 26 mm.  She has a small anterior fibroid and her IUD is in the correct position in the endometrial canal. No free fluid was seen. She has had negative AFP, hCG, LDH, and CA125 on 09/14/17 due to the more solid nature of her cyst.  EMB 06/08/17 atrophic endometrium with decidualized stroma.   Declines future childbearing.   Tried Depo Provera and had continued bleeding.  Her skin was sensitive to Ortho Evra.  Her vagina was sensitive to NuvaRing.  She is not consistent with taking OCPs.   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  It is physical work.  She does lift portable hoses that can weigh 20 - 30 pounds, and she does this repetitively.   UPT: negative  GYNECOLOGIC HISTORY: Patient's last menstrual period was 02/21/2019. Contraception:  Mirena IUD inserted 03/19/18 Menopausal hormone therapy:  none Last mammogram:  10-17-17 density b/BIRADS 1 negative Last pap smear:   02/22/16 Neg:Neg HR HPV        OB History    Gravida  0   Para  0   Term  0   Preterm  0   AB  0   Living  0     SAB  0   TAB  0   Ectopic  0   Multiple  0   Live Births  0              Patient Active Problem  List   Diagnosis Date Noted  . Cough, persistent 02/26/2019  . Allergic rhinitis 02/26/2019  . Ovarian mass, right 04/28/2018  . GERD (gastroesophageal reflux disease) 04/02/2018  . Circadian rhythm sleep disturbance 04/02/2018  . Hyperlipidemia 04/02/2018  . IUD (intrauterine device) in place 05/17/2013  . Class 2 obesity with body mass index (BMI) of 35.0 to 35.9 in adult 03/08/2013    Past Medical History:  Diagnosis Date  . Bronchitis 10/2013  . Depression   . Low vitamin D level   . Right ovarian cyst 2015   Calcified cyst - possible dermoid.  Needs yearly ultrasound.    Past Surgical History:  Procedure Laterality Date  . INTRAUTERINE DEVICE (IUD) INSERTION  04/11/13   Mirena    Current Outpatient Medications  Medication Sig Dispense Refill  . fluticasone (FLONASE) 50 MCG/ACT nasal spray Place 2 sprays into both nostrils daily. 16 g 6  . levonorgestrel (MIRENA) 20 MCG/24HR IUD 1 each by Intrauterine route once. Inserted 04/11/13    . pantoprazole (PROTONIX) 20 MG tablet Take 1 tablet (20 mg total) by mouth 2 (two) times daily before a meal. 60 tablet 3   No current facility-administered medications for this visit.      ALLERGIES: Amoxicillin  Family History  Problem Relation Age of Onset  . Diabetes Mother   . Pancreatic cancer Paternal Aunt 13  . Breast cancer Paternal Aunt   . Diabetes Maternal Grandmother   . Diabetes Maternal Grandfather   . Cancer Maternal Grandfather   . Obesity Maternal Grandfather   . Breast cancer Paternal Grandmother 44       postmenopausal  . Cancer Paternal Grandmother   . Hypertension Father   . Early death Father   . Hyperlipidemia Father   . Kidney disease Father   . Depression Sister   . Hypertension Sister   . Heart attack Paternal Grandfather   . Kidney failure Unknown 25    Social History   Socioeconomic History  . Marital status: Single    Spouse name: Not on file  . Number of children: 0  . Years of education:  Not on file  . Highest education level: Not on file  Occupational History  . Not on file  Social Needs  . Financial resource strain: Not on file  . Food insecurity:    Worry: Not on file    Inability: Not on file  . Transportation needs:    Medical: Not on file    Non-medical: Not on file  Tobacco Use  . Smoking status: Never Smoker  . Smokeless tobacco: Never Used  Substance and Sexual Activity  . Alcohol use: Yes    Alcohol/week: 1.0 - 2.0 standard drinks    Types: 1 - 2 Standard drinks or equivalent per week  . Drug use: No  . Sexual activity: Yes    Partners: Male    Birth control/protection: I.U.D.    Comment: Mirena Inserted 04/11/13  Lifestyle  . Physical activity:    Days per week: Not on file    Minutes per session: Not on file  . Stress: Not on file  Relationships  . Social connections:    Talks on phone: Not on file    Gets together: Not on file    Attends religious service: Not on file    Active member of club or organization: Not on file    Attends meetings of clubs or organizations: Not on file    Relationship status: Not on file  . Intimate partner violence:    Fear of current or ex partner: Not on file    Emotionally abused: Not on file    Physically abused: Not on file    Forced sexual activity: Not on file  Other Topics Concern  . Not on file  Social History Narrative  . Not on file    Review of Systems  Constitutional: Negative.   HENT: Negative.   Eyes: Negative.   Respiratory: Negative.   Cardiovascular: Negative.   Gastrointestinal: Negative.   Endocrine: Negative.   Genitourinary: Positive for vaginal bleeding.  Musculoskeletal: Negative.   Skin: Negative.   Allergic/Immunologic: Negative.   Neurological: Negative.   Hematological: Negative.   Psychiatric/Behavioral: Negative.     PHYSICAL EXAMINATION:    BP 112/72 (BP Location: Left Arm, Patient Position: Sitting, Cuff Size: Large)   Pulse 72   Temp (!) 97.5 F (36.4 C)  (Temporal)   Resp 12   Ht 5\' 4"  (1.626 m)   Wt 194 lb (88 kg)   LMP 02/21/2019   BMI 33.30 kg/m     General appearance: alert, cooperative and appears stated age   Pelvic: External genitalia:  no lesions  Urethra:  normal appearing urethra with no masses, tenderness or lesions              Bartholins and Skenes: normal                 Vagina: normal appearing vagina with normal color and discharge, no lesions              Cervix: no lesions.  IUD strings noted.  Clot at os.                 Bimanual Exam:  Uterus:  normal size, contour, position, consistency, mobility, non-tender              Adnexa: no mass, fullness, tenderness      Chaperone was present for exam.  ASSESSMENT   Menorrhagia with irregular menses.  Mirena IUD.  Failed medical therapy of many types. Right dermoid cyst, small fibroid.   PLAN  Aygestin 5 mg daily.  #30, RF one.  Return for pelvic US.  CBC, TSH.  Needs pap done when not having too much bleeding.  I discussed laparoscopic hysterectomy with bilateral salpingectomy, and right oophorectomy.  I reviewed  Risks, benefits, and alternatives.  Risks include but are not limited to bleeding, infection, damage to surrounding organs, pneumonia, reaction to anesthesia, DVT, PE, death, need for reoperation, hernia formation, and vaginal cuff dehiscence. No work for 8 weeks due to her physical lifting.    An After Visit Summary was printed and given to the patient.  ___25___ minutes face to face time of which over 50% was spent in counseling.

## 2019-03-12 NOTE — Telephone Encounter (Signed)
Spoke with patient regarding PUS scheduling. Patient needs early morning or late afternoon appointments due to work schedule. Patient is unavailable the afternoon of 03/14/2019. Appointment scheduled for 03/21/2019 at 4 pm with 4:30 pm consult with Dr.Silva. Patient is agreeable to date and time.  Routing to provider and will close encounter.

## 2019-03-13 LAB — CBC
Hematocrit: 38.5 % (ref 34.0–46.6)
Hemoglobin: 12.8 g/dL (ref 11.1–15.9)
MCH: 29.8 pg (ref 26.6–33.0)
MCHC: 33.2 g/dL (ref 31.5–35.7)
MCV: 90 fL (ref 79–97)
Platelets: 320 10*3/uL (ref 150–450)
RBC: 4.29 x10E6/uL (ref 3.77–5.28)
RDW: 12.1 % (ref 11.7–15.4)
WBC: 5.5 10*3/uL (ref 3.4–10.8)

## 2019-03-13 LAB — TSH: TSH: 1.46 u[IU]/mL (ref 0.450–4.500)

## 2019-03-14 NOTE — Telephone Encounter (Signed)
See office visit notes 03-12-19.

## 2019-03-18 ENCOUNTER — Telehealth: Payer: Self-pay | Admitting: Obstetrics and Gynecology

## 2019-03-18 NOTE — Telephone Encounter (Signed)
Call placed to review benefits for scheduled ultrasound. Left voicemail message requesting a return call

## 2019-03-21 ENCOUNTER — Encounter: Payer: Self-pay | Admitting: Obstetrics and Gynecology

## 2019-03-21 ENCOUNTER — Other Ambulatory Visit: Payer: Self-pay

## 2019-03-21 ENCOUNTER — Ambulatory Visit (INDEPENDENT_AMBULATORY_CARE_PROVIDER_SITE_OTHER): Payer: 59

## 2019-03-21 ENCOUNTER — Ambulatory Visit (INDEPENDENT_AMBULATORY_CARE_PROVIDER_SITE_OTHER): Payer: 59 | Admitting: Obstetrics and Gynecology

## 2019-03-21 ENCOUNTER — Other Ambulatory Visit (HOSPITAL_COMMUNITY)
Admission: RE | Admit: 2019-03-21 | Discharge: 2019-03-21 | Disposition: A | Discharge status: Home / Self Care | Payer: 59 | Source: Ambulatory Visit | Attending: Obstetrics and Gynecology | Admitting: Obstetrics and Gynecology

## 2019-03-21 VITALS — BP 118/80 | HR 70 | Temp 97.3°F | Resp 16 | Wt 201.0 lb

## 2019-03-21 DIAGNOSIS — T8332XA Displacement of intrauterine contraceptive device, initial encounter: Secondary | ICD-10-CM

## 2019-03-21 DIAGNOSIS — N83201 Unspecified ovarian cyst, right side: Secondary | ICD-10-CM | POA: Diagnosis not present

## 2019-03-21 DIAGNOSIS — Z124 Encounter for screening for malignant neoplasm of cervix: Secondary | ICD-10-CM | POA: Diagnosis not present

## 2019-03-21 DIAGNOSIS — N921 Excessive and frequent menstruation with irregular cycle: Secondary | ICD-10-CM | POA: Diagnosis not present

## 2019-03-21 NOTE — Progress Notes (Signed)
GYNECOLOGY  VISIT   HPI: 43 y.o.   Single  African American  female   Courtney Donaldson with Patient's last menstrual period was 02/21/2019.   here for ultrasound & pap    Taking Aygestin 5 mg daily.  Bleeding stopped. Not having any problems.  She declines future childbearing.   Considering hysterectomy surgery in September.   Now on Protonix for reflux.  She has chronic cough due to this.   Not SA for 2 months.  Negative UPT on 03/12/19.  GYNECOLOGIC HISTORY: Patient's last menstrual period was 02/21/2019. Contraception:  IUD Menopausal hormone therapy: NA. Last mammogram:  10-17-17 category b density birads 1:neg Last pap smear:   02-25-16 neg HPV HR neg        OB History    Gravida  0   Para  0   Term  0   Preterm  0   AB  0   Living  0     SAB  0   TAB  0   Ectopic  0   Multiple  0   Live Births  0              Patient Active Problem List   Diagnosis Date Noted  . Cough, persistent 02/26/2019  . Allergic rhinitis 02/26/2019  . Ovarian mass, right 04/28/2018  . GERD (gastroesophageal reflux disease) 04/02/2018  . Circadian rhythm sleep disturbance 04/02/2018  . Hyperlipidemia 04/02/2018  . IUD (intrauterine device) in place 05/17/2013  . Class 2 obesity with body mass index (BMI) of 35.0 to 35.9 in adult 03/08/2013    Past Medical History:  Diagnosis Date  . Bronchitis 10/2013  . Depression   . Low vitamin D level   . Right ovarian cyst 2015   Calcified cyst - possible dermoid.  Needs yearly ultrasound.    Past Surgical History:  Procedure Laterality Date  . INTRAUTERINE DEVICE (IUD) INSERTION  04/11/13   Mirena    Current Outpatient Medications  Medication Sig Dispense Refill  . fluticasone (FLONASE) 50 MCG/ACT nasal spray Place 2 sprays into both nostrils daily. 16 g 6  . levonorgestrel (MIRENA) 20 MCG/24HR IUD 1 each by Intrauterine route once. Inserted 04/11/13    . norethindrone (AYGESTIN) 5 MG tablet Take 1 tablet (5 mg total) by  mouth daily. 30 tablet 1  . pantoprazole (PROTONIX) 20 MG tablet Take 1 tablet (20 mg total) by mouth 2 (two) times daily before a meal. 60 tablet 3   No current facility-administered medications for this visit.      ALLERGIES: Amoxicillin  Family History  Problem Relation Age of Onset  . Diabetes Mother   . Pancreatic cancer Paternal Aunt 58  . Breast cancer Paternal Aunt   . Diabetes Maternal Grandmother   . Diabetes Maternal Grandfather   . Cancer Maternal Grandfather   . Obesity Maternal Grandfather   . Breast cancer Paternal Grandmother 71       postmenopausal  . Cancer Paternal Grandmother   . Hypertension Father   . Early death Father   . Hyperlipidemia Father   . Kidney disease Father   . Depression Sister   . Hypertension Sister   . Heart attack Paternal Grandfather   . Kidney failure Other 62    Social History   Socioeconomic History  . Marital status: Single    Spouse name: Not on file  . Number of children: 0  . Years of education: Not on file  . Highest education level: Not on  file  Occupational History  . Not on file  Social Needs  . Financial resource strain: Not on file  . Food insecurity:    Worry: Not on file    Inability: Not on file  . Transportation needs:    Medical: Not on file    Non-medical: Not on file  Tobacco Use  . Smoking status: Never Smoker  . Smokeless tobacco: Never Used  Substance and Sexual Activity  . Alcohol use: Yes    Alcohol/week: 1.0 - 2.0 standard drinks    Types: 1 - 2 Standard drinks or equivalent per week  . Drug use: No  . Sexual activity: Yes    Partners: Male    Birth control/protection: I.U.D.    Comment: Mirena Inserted 04/11/13  Lifestyle  . Physical activity:    Days per week: Not on file    Minutes per session: Not on file  . Stress: Not on file  Relationships  . Social connections:    Talks on phone: Not on file    Gets together: Not on file    Attends religious service: Not on file    Active  member of club or organization: Not on file    Attends meetings of clubs or organizations: Not on file    Relationship status: Not on file  . Intimate partner violence:    Fear of current or ex partner: Not on file    Emotionally abused: Not on file    Physically abused: Not on file    Forced sexual activity: Not on file  Other Topics Concern  . Not on file  Social History Narrative  . Not on file    Review of Systems  Constitutional: Negative.   HENT: Negative.   Eyes: Negative.   Respiratory: Negative.   Cardiovascular: Negative.   Gastrointestinal: Negative.   Endocrine: Negative.   Genitourinary: Negative.   Musculoskeletal: Negative.   Skin: Negative.   Allergic/Immunologic: Negative.   Neurological: Negative.   Psychiatric/Behavioral: Negative.     PHYSICAL EXAMINATION:    BP 118/80   Pulse 70   Temp (!) 97.3 F (36.3 C) (Oral)   Resp 16   Wt 201 lb (91.2 kg)   LMP 02/21/2019   BMI 34.50 kg/m     General appearance: alert, cooperative and appears stated age  Pelvic: External genitalia:  no lesions              Urethra:  normal appearing urethra with no masses, tenderness or lesions              Bartholins and Skenes: normal                 Vagina: normal appearing vagina with normal color and discharge, no lesions              Cervix: no lesions                Bimanual Exam:  Uterus:  normal size, contour, position, consistency, mobility, non-tender              Adnexa: no mass, fullness, tenderness               Pelvic US Uterus with no myometrial masses.  IUD in LUS and cervix with tip 6 mm from external os.  Right ovary with 24 x 8 mm cyst with internal echoes, consistent with dermoid.  No change from previous. Left ovary with follicular type cyst - 24 x 15 x  14 mm.  No free fluid.   IUD removed with ring forceps after verbal consent.   Chaperone was present for exam.  ASSESSMENT  Expelling IUD.  Menorrhagia improved with Aygestin.  Right  dermoid cyst.   PLAN  Pap and HR HPV.  Schedule mammogram.  Continue Aygestin 5 gm daily. Will move toward laparoscopic hysterectomy, right oophorectomy, bilateral salpingectomy and cystoscopy for this fall. Will use condoms for pregnancy prevention.    An After Visit Summary was printed and given to the patient.  __15____ minutes face to face time of which over 50% was spent in counseling.

## 2019-03-21 NOTE — Telephone Encounter (Signed)
Spoke with patient in regards to benefits for scheduled ultrasound appointment today, 03/21/2019, and for potential surgery. Patient understood information presented. Patient has advised she is looking to plan surgery "the end of August".    Will close encounter.    cc: Lamont Snowball, RN

## 2019-03-21 NOTE — Telephone Encounter (Signed)
Call placed to patient to review benefits for potential surgery. Left voicemail message requesting a return call   cc: Lamont Snowball, RN

## 2019-03-21 NOTE — Telephone Encounter (Signed)
Patient returning call. Will send staff message to [facility].

## 2019-03-22 LAB — CYTOLOGY - PAP
Diagnosis: NEGATIVE
HPV: NOT DETECTED

## 2019-03-26 ENCOUNTER — Telehealth: Payer: Self-pay | Admitting: Obstetrics and Gynecology

## 2019-03-26 NOTE — Telephone Encounter (Signed)
Please let patient know that she did not have an annual exam performed at her last visit.  She had a pelvic ultrasound, removal of her IUD, and a pap smear.   I would recommend she reschedule her annual exam for later this summer.  She is considering surgery for this fall, and she will need to be up to date on her annual exam prior to this.

## 2019-03-26 NOTE — Telephone Encounter (Signed)
Patient cancelled aex that was scheduled for Friday 5/29. She says she had done at appointment on 5/21.

## 2019-03-27 NOTE — Telephone Encounter (Signed)
Spoke with patient, advised as seen below per [name]. AEX scheduled for 7/1 at 9am with [name].   Routing to provider for final review. Patient is agreeable to disposition. Will close encounter.

## 2019-03-29 ENCOUNTER — Ambulatory Visit: Payer: 59 | Admitting: Obstetrics and Gynecology

## 2019-04-04 ENCOUNTER — Other Ambulatory Visit: Payer: Self-pay | Admitting: Obstetrics and Gynecology

## 2019-04-04 NOTE — Telephone Encounter (Signed)
rx for aygestin was given 03-12-2019 #30 with 1 refill. aex is 05-01-2019. No new rx is needed. This request is denied.

## 2019-05-01 ENCOUNTER — Ambulatory Visit: Payer: Self-pay | Admitting: Obstetrics and Gynecology

## 2019-05-08 ENCOUNTER — Other Ambulatory Visit: Payer: Self-pay | Admitting: *Deleted

## 2019-05-08 DIAGNOSIS — Z20822 Contact with and (suspected) exposure to covid-19: Secondary | ICD-10-CM

## 2019-05-08 NOTE — Progress Notes (Signed)
[id number] 

## 2019-05-09 ENCOUNTER — Ambulatory Visit: Payer: Self-pay | Admitting: Obstetrics and Gynecology

## 2019-05-13 LAB — NOVEL CORONAVIRUS, NAA: SARS-CoV-2, NAA: NOT DETECTED

## 2019-05-25 ENCOUNTER — Other Ambulatory Visit: Payer: Self-pay | Admitting: Obstetrics and Gynecology

## 2019-05-27 NOTE — Telephone Encounter (Signed)
Medication refill request: norethindrone  Last AEX:  OV 03/21/19  Next AEX: 05/30/19  Last MMG (if hormonal medication request): 10/17/17 BIRADS1:neg  Refill authorized: 03/12/19 #30/1R. Today please advise.

## 2019-05-28 ENCOUNTER — Other Ambulatory Visit: Payer: Self-pay

## 2019-05-30 ENCOUNTER — Ambulatory Visit: Payer: 59 | Admitting: Obstetrics and Gynecology

## 2019-05-30 ENCOUNTER — Other Ambulatory Visit: Payer: Self-pay

## 2019-05-30 ENCOUNTER — Encounter: Payer: Self-pay | Admitting: Obstetrics and Gynecology

## 2019-05-30 ENCOUNTER — Other Ambulatory Visit (HOSPITAL_COMMUNITY)
Admission: RE | Admit: 2019-05-30 | Discharge: 2019-05-30 | Disposition: A | Discharge status: Home / Self Care | Payer: 59 | Source: Ambulatory Visit | Attending: Obstetrics and Gynecology | Admitting: Obstetrics and Gynecology

## 2019-05-30 VITALS — BP 128/76 | HR 76 | Temp 97.5°F | Resp 12 | Ht 63.25 in | Wt 187.0 lb

## 2019-05-30 DIAGNOSIS — Z01419 Encounter for gynecological examination (general) (routine) without abnormal findings: Secondary | ICD-10-CM

## 2019-05-30 DIAGNOSIS — Z113 Encounter for screening for infections with a predominantly sexual mode of transmission: Secondary | ICD-10-CM | POA: Diagnosis not present

## 2019-05-30 DIAGNOSIS — N83201 Unspecified ovarian cyst, right side: Secondary | ICD-10-CM

## 2019-05-30 MED ORDER — NORETHINDRONE ACETATE 5 MG PO TABS: 5.0000 mg | ORAL_TABLET | Freq: Every day | ORAL | 0 refills | Status: DC

## 2019-05-30 NOTE — Progress Notes (Signed)
43 y.o. G0P0000 Single African American female here for annual exam.    On Aygestin 5 mg daily.  Still has a period for one week per month.  Starts heavy and pad change 3 - 4 times in a 24 hour period.  No irregular bleeding.  No significant cramping.   Pelvic US on 03/21/19: Uterus with no myometrial masses.  IUD in LUS and cervix with tip 6 mm from external os.  Right ovary with 24 x 28 mm cyst with internal echoes, consistent with dermoid.  No change from previous. Left ovary with follicular type cyst - 24 x 15 x 14 mm.  No free fluid.   IUD was removed at the ultrasound visit.   Chronic cough resolved with medication to treat reflux.   PCP: Betty Martinique, MD     Patient's last menstrual period was 05/08/2019 (within days).           Sexually active: Yes.    The current method of family planning is none.    Exercising: Yes.    walking Smoker:  no  Health Maintenance: Pap:  03/20/19 Neg:Neg HR HPV History of abnormal Pap:  no MMG:  10/17/17 BIRADS 1 negative/density b TDaP:  UTD per patient Gardasil:   no HIV and Hep C: negative in 2019 Screening Labs: discuss today   reports that she has never smoked. She has never used smokeless tobacco. She reports current alcohol use of about 1.0 - 2.0 standard drinks of alcohol per week. She reports that she does not use drugs.  Past Medical History:  Diagnosis Date  . Bronchitis 10/2013  . Depression   . Low vitamin D level   . Right ovarian cyst 2015   Calcified cyst - possible dermoid.  Needs yearly ultrasound.    Past Surgical History:  Procedure Laterality Date  . INTRAUTERINE DEVICE (IUD) INSERTION  04/11/13   Mirena    Current Outpatient Medications  Medication Sig Dispense Refill  . norethindrone (AYGESTIN) 5 MG tablet Take 1 tablet (5 mg total) by mouth daily. 30 tablet 1  . pantoprazole (PROTONIX) 20 MG tablet Take 1 tablet (20 mg total) by mouth 2 (two) times daily before a meal. 60 tablet 3  . fluticasone  (FLONASE) 50 MCG/ACT nasal spray Place 2 sprays into both nostrils daily. (Patient not taking: Reported on 05/30/2019) 16 g 6   No current facility-administered medications for this visit.     Family History  Problem Relation Age of Onset  . Diabetes Mother   . Pancreatic cancer Paternal Aunt 75  . Breast cancer Paternal Aunt   . Diabetes Maternal Grandmother   . Diabetes Maternal Grandfather   . Cancer Maternal Grandfather   . Obesity Maternal Grandfather   . Breast cancer Paternal Grandmother 68       postmenopausal  . Cancer Paternal Grandmother   . Hypertension Father   . Early death Father   . Hyperlipidemia Father   . Kidney disease Father   . Depression Sister   . Hypertension Sister   . Heart attack Paternal Grandfather   . Kidney failure Other 55    Review of Systems  Constitutional: Negative.   HENT: Negative.   Eyes: Negative.   Respiratory: Negative.   Cardiovascular: Negative.   Gastrointestinal: Negative.   Endocrine: Negative.   Genitourinary: Negative.   Musculoskeletal: Negative.   Skin: Negative.   Allergic/Immunologic: Negative.   Neurological: Negative.   Hematological: Negative.   Psychiatric/Behavioral: Negative.  Exam:   BP 128/76 (BP Location: Right Arm, Patient Position: Sitting, Cuff Size: Normal)   Pulse 76   Temp (!) 97.5 F (36.4 C) (Temporal)   Resp 12   Ht 5' 3.25" (1.607 m)   Wt 187 lb (84.8 kg)   LMP 05/08/2019 (Within Days)   BMI 32.86 kg/m     General appearance: alert, cooperative and appears stated age Head: normocephalic, without obvious abnormality, atraumatic Neck: no adenopathy, supple, symmetrical, trachea midline and thyroid normal to inspection and palpation Lungs: clear to auscultation bilaterally Breasts: normal appearance, no masses or tenderness, No nipple retraction or dimpling, No nipple discharge or bleeding, No axillary adenopathy Heart: regular rate and rhythm Abdomen: soft, non-tender; no masses, no  organomegaly Extremities: extremities normal, atraumatic, no cyanosis or edema Skin: skin color, texture, turgor normal. No rashes or lesions Lymph nodes: cervical, supraclavicular, and axillary nodes normal. Neurologic: grossly normal  Pelvic: External genitalia:  no lesions              No abnormal inguinal nodes palpated.              Urethra:  normal appearing urethra with no masses, tenderness or lesions              Bartholins and Skenes: normal                 Vagina: normal appearing vagina with normal color and discharge, no lesions              Cervix: no lesions              Pap taken: No. Bimanual Exam:  Uterus:  normal size, contour, position, consistency, mobility, non-tender              Adnexa: no mass, fullness, tenderness              Rectal exam: Yes.  .  Confirms.              Anus:  normal sphincter tone, no lesions  Chaperone was present for exam.  Assessment:   Well woman visit with normal exam. Suspected right dermoid cyst.  Irregular menses controlled on Aygestin.  Previously uncontrolled. STD testing.   Plan: Mammogram screening discussed. She will update.  Self breast awareness reviewed. Pap and HR HPV as above. Guidelines for Calcium, Vitamin D, regular exercise program including cardiovascular and weight bearing exercise. Aygestin 5 mg daily. #90, RF none.  I re-emphasized the need for contraception.  STD screening. Routine labs.  We discussed laparoscopic hysterectomy with bilateral salpingectomy and right oophorectomy, cystoscopy for this fall.  Follow up annually and prn.  After visit summary provided.

## 2019-05-31 LAB — COMPREHENSIVE METABOLIC PANEL
ALT: 16 IU/L (ref 0–32)
AST: 19 IU/L (ref 0–40)
Albumin/Globulin Ratio: 1.6 (ref 1.2–2.2)
Albumin: 4.6 g/dL (ref 3.8–4.8)
Alkaline Phosphatase: 55 IU/L (ref 39–117)
BUN/Creatinine Ratio: 14 (ref 9–23)
BUN: 14 mg/dL (ref 6–24)
Bilirubin Total: 0.5 mg/dL (ref 0.0–1.2)
CO2: 22 mmol/L (ref 20–29)
Calcium: 9.1 mg/dL (ref 8.7–10.2)
Chloride: 104 mmol/L (ref 96–106)
Creatinine, Ser: 1.01 mg/dL — ABNORMAL HIGH (ref 0.57–1.00)
GFR calc Af Amer: 79 mL/min/{1.73_m2} (ref >59)
GFR calc non Af Amer: 69 mL/min/{1.73_m2} (ref >59)
Globulin, Total: 2.8 g/dL (ref 1.5–4.5)
Glucose: 58 mg/dL — ABNORMAL LOW (ref 65–99)
Potassium: 4.3 mmol/L (ref 3.5–5.2)
Sodium: 143 mmol/L (ref 134–144)
Total Protein: 7.4 g/dL (ref 6.0–8.5)

## 2019-05-31 LAB — LIPID PANEL
Chol/HDL Ratio: 4.8 ratio — ABNORMAL HIGH (ref 0.0–4.4)
Cholesterol, Total: 184 mg/dL (ref 100–199)
HDL: 38 mg/dL — ABNORMAL LOW (ref >39)
LDL Calculated: 128 mg/dL — ABNORMAL HIGH (ref 0–99)
Triglycerides: 90 mg/dL (ref 0–149)
VLDL Cholesterol Cal: 18 mg/dL (ref 5–40)

## 2019-05-31 LAB — CBC
Hematocrit: 40.6 % (ref 34.0–46.6)
Hemoglobin: 13.2 g/dL (ref 11.1–15.9)
MCH: 29.9 pg (ref 26.6–33.0)
MCHC: 32.5 g/dL (ref 31.5–35.7)
MCV: 92 fL (ref 79–97)
Platelets: 390 10*3/uL (ref 150–450)
RBC: 4.41 x10E6/uL (ref 3.77–5.28)
RDW: 12.4 % (ref 11.7–15.4)
WBC: 5.7 10*3/uL (ref 3.4–10.8)

## 2019-05-31 LAB — HEP, RPR, HIV PANEL
HIV Screen 4th Generation wRfx: NONREACTIVE
Hepatitis B Surface Ag: NEGATIVE
RPR Ser Ql: NONREACTIVE

## 2019-05-31 LAB — HEPATITIS C ANTIBODY: Hep C Virus Ab: 0.1 s/co ratio (ref 0.0–0.9)

## 2019-06-01 LAB — CERVICOVAGINAL ANCILLARY ONLY
Chlamydia: NEGATIVE
Neisseria Gonorrhea: NEGATIVE
Trichomonas: NEGATIVE

## 2019-08-06 ENCOUNTER — Telehealth: Payer: Self-pay | Admitting: *Deleted

## 2019-08-06 NOTE — Telephone Encounter (Signed)
Call to patient to follow-up on plans for surgery.  Patient states " I guess I need to have it done."  Advised this is the follow-up she had previously requested and that end of year dates will fill early and if she desires to proceed, will need to begin making plans. Patient requests call from business office with benefit information.

## 2019-08-07 NOTE — Telephone Encounter (Signed)
Call placed to patient in regards to re-verified surgery benefits. Spoke with patient extensively regarding current benefits. Patient acknowledges understanding of information presented. Patient is aware this is the professional benefit only. Patient is aware, once surgery date has been established, the hospital will contact her with separate benefits. Patient to call back to confirm.   cc: Lamont Snowball, RN

## 2019-08-13 NOTE — Telephone Encounter (Signed)
[name] -please advise on contraceptive options.

## 2019-08-13 NOTE — Telephone Encounter (Signed)
I recommend an office visit.  She has been on Aygestin for control of her cycles.

## 2019-08-13 NOTE — Telephone Encounter (Signed)
Patient called today, to review benefit information for surgery again. Patient requested the phone number to [facility] to obtain facility benefits. Patient states she is leaning toward a surgery date of 10/21/2019 and understands requirements to secure that surgery date. Patient to call back after speaking with the [place].  Patient also states, she had a Mirena IUD taken out.  Patient is asking about birth control options, until she has her surgery  Routing to Triage Nurse  cc: Lamont Snowball, RN

## 2019-08-14 NOTE — Telephone Encounter (Signed)
Left message to call Keyli Duross, RN at [facility] [phone].   

## 2019-08-19 ENCOUNTER — Telehealth: Payer: Self-pay | Admitting: Obstetrics and Gynecology

## 2019-08-19 NOTE — Telephone Encounter (Signed)
See 08/06/19 telephone encounter.   Encounter closed.

## 2019-08-19 NOTE — Telephone Encounter (Signed)
Spoke with patient. OV scheduled for 08/22/19 at2pm with [name]. [id number] precautions reviewed.     Laurence Slate 56 minutes ago (12:00 PM)     Patient would like to see if she can start back on Lo Estrin. Please advise        Routing to provider for final review. Patient is agreeable to disposition. Will close encounter.  Cc: Lerry Liner

## 2019-08-19 NOTE — Telephone Encounter (Signed)
Patient would like to see if she can start back on Lo Estrin. Please advise

## 2019-08-20 ENCOUNTER — Other Ambulatory Visit: Payer: Self-pay

## 2019-08-22 ENCOUNTER — Other Ambulatory Visit: Payer: Self-pay | Admitting: Obstetrics and Gynecology

## 2019-08-22 ENCOUNTER — Telehealth: Payer: Self-pay | Admitting: Obstetrics and Gynecology

## 2019-08-22 ENCOUNTER — Ambulatory Visit (INDEPENDENT_AMBULATORY_CARE_PROVIDER_SITE_OTHER): Payer: 59 | Admitting: Obstetrics and Gynecology

## 2019-08-22 ENCOUNTER — Other Ambulatory Visit: Payer: Self-pay

## 2019-08-22 ENCOUNTER — Encounter: Payer: Self-pay | Admitting: Obstetrics and Gynecology

## 2019-08-22 VITALS — BP 138/88 | HR 66 | Temp 97.8°F | Ht 63.25 in | Wt 181.4 lb

## 2019-08-22 DIAGNOSIS — N939 Abnormal uterine and vaginal bleeding, unspecified: Secondary | ICD-10-CM | POA: Diagnosis not present

## 2019-08-22 DIAGNOSIS — N83201 Unspecified ovarian cyst, right side: Secondary | ICD-10-CM | POA: Diagnosis not present

## 2019-08-22 DIAGNOSIS — Z3009 Encounter for other general counseling and advice on contraception: Secondary | ICD-10-CM

## 2019-08-22 DIAGNOSIS — Z1231 Encounter for screening mammogram for malignant neoplasm of breast: Secondary | ICD-10-CM

## 2019-08-22 LAB — POCT URINE PREGNANCY: Preg Test, Ur: NEGATIVE

## 2019-08-22 MED ORDER — LEVONORGEST-ETH ESTRAD 91-DAY 0.15-0.03 MG PO TABS: 1.0000 | ORAL_TABLET | Freq: Every day | ORAL | 0 refills | Status: DC

## 2019-08-22 NOTE — Telephone Encounter (Signed)
See previous phone notes. Patient called today to advise she is ready to proceed with scheduling surgery. Patient has confirmed. Forwarding to Conservation officer, historic buildings for scheduling.  Routing to Lamont Snowball, RN

## 2019-08-22 NOTE — Progress Notes (Signed)
GYNECOLOGY  VISIT   HPI: 43 y.o.   Single  African American  female   [place] with Patient's last menstrual period was 07/21/2019 (exact date).   here to discuss birth control options.    Patient states began cycle 07-21-19 and has been bleeding daily since.  Her IUD was removed in July due to malpositioning, located in the LUS.  She has a right ovarian cyst 24 x 28 mm consistent with a dermoid.   She has a long hx of abnormal uterine bleeding and her EMB 8918 showed benign inactive endometrium.  She was on COCs when she had her Mirena IUD because she continued to have irregular bleeding.  She has been taking Aygestin 5 mg daily.  She stopped this is August for a couple of weeks, and she does not remember why.  Back on Aygestin now since Sept 20, when her cycle started.  Occasional cramping.   She is interested in proceeding with hysterectomy in December.   UPT:Neg  GYNECOLOGIC HISTORY: Patient's last menstrual period was 07/21/2019 (exact date). Contraception:  None/Aygestin Menopausal hormone therapy:  none Last mammogram: 10/17/17 BIRADS 1 negative/density b Last pap smear:  03/20/19 Neg:Neg HR HPV        OB History    Gravida  0   Para  0   Term  0   Preterm  0   AB  0   Living  0     SAB  0   TAB  0   Ectopic  0   Multiple  0   Live Births  0              Patient Active Problem List   Diagnosis Date Noted  . Cough, persistent 02/26/2019  . Allergic rhinitis 02/26/2019  . Ovarian mass, right 04/28/2018  . GERD (gastroesophageal reflux disease) 04/02/2018  . Circadian rhythm sleep disturbance 04/02/2018  . Hyperlipidemia 04/02/2018  . IUD (intrauterine device) in place 05/17/2013  . Class 2 obesity with body mass index (BMI) of 35.0 to 35.9 in adult 03/08/2013    Past Medical History:  Diagnosis Date  . Bronchitis 10/2013  . Depression   . Low vitamin D level   . Right ovarian cyst 2015   Calcified cyst - possible dermoid.  Needs yearly  ultrasound.    Past Surgical History:  Procedure Laterality Date  . INTRAUTERINE DEVICE (IUD) INSERTION  04/11/13   Mirena    Current Outpatient Medications  Medication Sig Dispense Refill  . fluticasone (FLONASE) 50 MCG/ACT nasal spray Place 2 sprays into both nostrils daily. (Patient not taking: Reported on 05/30/2019) 16 g 6  . norethindrone (AYGESTIN) 5 MG tablet Take 1 tablet (5 mg total) by mouth daily. 90 tablet 0  . pantoprazole (PROTONIX) 20 MG tablet Take 1 tablet (20 mg total) by mouth 2 (two) times daily before a meal. 60 tablet 3   No current facility-administered medications for this visit.      ALLERGIES: Amoxicillin  Family History  Problem Relation Age of Onset  . Diabetes Mother   . Pancreatic cancer Paternal Aunt 40  . Breast cancer Paternal Aunt   . Diabetes Maternal Grandmother   . Diabetes Maternal Grandfather   . Cancer Maternal Grandfather   . Obesity Maternal Grandfather   . Breast cancer Paternal Grandmother 9       postmenopausal  . Cancer Paternal Grandmother   . Hypertension Father   . Early death Father   . Hyperlipidemia Father   .  Kidney disease Father   . Depression Sister   . Hypertension Sister   . Heart attack Paternal Grandfather   . Kidney failure Other 80    Social History   Socioeconomic History  . Marital status: Single    Spouse name: Not on file  . Number of children: 0  . Years of education: Not on file  . Highest education level: Not on file  Occupational History  . Not on file  Social Needs  . Financial resource strain: Not on file  . Food insecurity    Worry: Not on file    Inability: Not on file  . Transportation needs    Medical: Not on file    Non-medical: Not on file  Tobacco Use  . Smoking status: Never Smoker  . Smokeless tobacco: Never Used  Substance and Sexual Activity  . Alcohol use: Yes    Alcohol/week: 1.0 - 2.0 standard drinks    Types: 1 - 2 Standard drinks or equivalent per week  . Drug  use: No  . Sexual activity: Yes    Partners: Male  Lifestyle  . Physical activity    Days per week: Not on file    Minutes per session: Not on file  . Stress: Not on file  Relationships  . Social Herbalist on phone: Not on file    Gets together: Not on file    Attends religious service: Not on file    Active member of club or organization: Not on file    Attends meetings of clubs or organizations: Not on file    Relationship status: Not on file  . Intimate partner violence    Fear of current or ex partner: Not on file    Emotionally abused: Not on file    Physically abused: Not on file    Forced sexual activity: Not on file  Other Topics Concern  . Not on file  Social History Narrative  . Not on file    Review of Systems  PHYSICAL EXAMINATION:    BP 138/88 (Cuff Size: Large)   Pulse 66   Temp 97.8 F (36.6 C) (Temporal)   Ht 5' 3.25" (1.607 m)   Wt 181 lb 6.4 oz (82.3 kg)   LMP 07/21/2019 (Exact Date)   BMI 31.88 kg/m     General appearance: alert, cooperative and appears stated age  Pelvic: External genitalia:  no lesions              Urethra:  normal appearing urethra with no masses, tenderness or lesions              Bartholins and Skenes: normal                 Vagina: normal appearing vagina with normal color and discharge, no lesions              Cervix: no lesions                Bimanual Exam:  Uterus:  normal size, contour, position, consistency, mobility, non-tender              Adnexa: no mass, fullness, tenderness         Chaperone was present for exam.  ASSESSMENT  Abnormal uterine bleeding.  Currently on Aygestin. Right ovarian cyst.  Possible dermoid. Need or contraception.   PLAN  Stop Aygestin.  Start [place].  She will update her mammogram.  Will move forward with laparoscopic  hysterectomy, bilateral salpingectomy, right oophorectomy in December.    An After Visit Summary was printed and given to the patient.  __15____  minutes face to face time of which over 50% was spent in counseling.

## 2019-08-22 NOTE — Telephone Encounter (Signed)
Medication refill request: Aygestin Last AEX:  05/30/19 BS Next AEX: 07/15/20 Last MMG (if hormonal medication request): 10/17/17 BIRADS 1 negative/density b Refill authorized: Please advise if patient needs refill

## 2019-08-26 ENCOUNTER — Other Ambulatory Visit: Payer: Self-pay | Admitting: Family Medicine

## 2019-08-26 DIAGNOSIS — K219 Gastro-esophageal reflux disease without esophagitis: Secondary | ICD-10-CM

## 2019-08-27 NOTE — Telephone Encounter (Signed)
Call to patient. Patient states she is just walking into work, works second shift. Requesting a call back in the morning to review surgery information form and to schedule pre op and post op appointments.

## 2019-08-28 NOTE — Telephone Encounter (Signed)
Call to patient. Surgery information form reviewed in detail with patient and she verbalized understanding. Pre-op appointment scheduled for 10-01-2019 at 1600. Patient agreeable to date and time of appointment. Aware will have separate pre- op at [facility]. Advised Covid pre-surgical screen scheduled for 10-17-2019. Patient aware will need to quarantine after testing until surgery. "8 things to do during your quarantine" reviewed with patient and she verbalized understanding. Post op appointments scheduled for 10-28-2019 at 0800 and 12-02-19 at 0800. Patient agreeable to date and time of both appointments. Patient aware will receive a copy of information forms at pre op appointment.   Routing to provider and will close encounter.   Cc Lamont Snowball, RN

## 2019-09-09 ENCOUNTER — Other Ambulatory Visit: Payer: Self-pay

## 2019-09-09 ENCOUNTER — Ambulatory Visit (INDEPENDENT_AMBULATORY_CARE_PROVIDER_SITE_OTHER): Payer: 59 | Admitting: Family Medicine

## 2019-09-09 ENCOUNTER — Encounter: Payer: Self-pay | Admitting: Family Medicine

## 2019-09-09 VITALS — BP 145/102 | HR 74 | Temp 97.4°F | Resp 12 | Ht 63.25 in | Wt 182.0 lb

## 2019-09-09 DIAGNOSIS — Z6835 Body mass index (BMI) 35.0-35.9, adult: Secondary | ICD-10-CM

## 2019-09-09 DIAGNOSIS — R208 Other disturbances of skin sensation: Secondary | ICD-10-CM

## 2019-09-09 DIAGNOSIS — R030 Elevated blood-pressure reading, without diagnosis of hypertension: Secondary | ICD-10-CM | POA: Diagnosis not present

## 2019-09-09 DIAGNOSIS — M79605 Pain in left leg: Secondary | ICD-10-CM | POA: Diagnosis not present

## 2019-09-09 DIAGNOSIS — E6609 Other obesity due to excess calories: Secondary | ICD-10-CM

## 2019-09-09 DIAGNOSIS — R200 Anesthesia of skin: Secondary | ICD-10-CM

## 2019-09-09 MED ORDER — METHYLPREDNISOLONE ACETATE 40 MG/ML IJ SUSP
40.0000 mg | Freq: Once | INTRAMUSCULAR | Status: AC
  Administered 2019-09-09: 40 mg via INTRAMUSCULAR

## 2019-09-09 NOTE — Progress Notes (Signed)
ACUTE VISIT   HPI:  Chief Complaint  Patient presents with  . Leg Pain    Courtney Donaldson is a 43 y.o. female, who is here today complaining of a month of LLE pain. Pain starts in left gluteus and radiates to left calf and foot. Cramping-like pain, now it is 5-10/10. Problem is getting better. Pain is exacerbated by prolonged sitting and prolonged standing. Alleviated by changing positions.  Problem does not interfere with his sleep or with daily activities.  Mild tingling sensation of left 4th and 5th toe. She has not noted focal weakness or unstable gait.  This is a new problem. She has not noted lower back pain, saddle anesthesia, or bowel/urine dysfunction.  She has some meloxicam left from old Rx, she has been taking it daily as needed.  Noted elevated BP. No history of hypertension. Denies severe/frequent headache, visual changes, chest pain, dyspnea, palpitation, claudication, focal weakness, or edema.  Lab Results  Component Value Date   CREATININE 1.01 (H) 05/30/2019   BUN 14 05/30/2019   NA 143 05/30/2019   K 4.3 05/30/2019   CL 104 05/30/2019   CO2 22 05/30/2019    Lab Results  Component Value Date   TSH 1.460 03/12/2019   Lab Results  Component Value Date   WBC 5.7 05/30/2019   HGB 13.2 05/30/2019   HCT 40.6 05/30/2019   MCV 92 05/30/2019   PLT 390 05/30/2019    Review of Systems  Constitutional: Negative for activity change, appetite change, fatigue and fever.  HENT: Negative for mouth sores, nosebleeds and sore throat.   Respiratory: Negative for cough and wheezing.   Gastrointestinal: Negative for abdominal pain, nausea and vomiting.       Negative for changes in bowel habits.  Genitourinary: Negative for decreased urine volume and hematuria.  Skin: Negative for rash and wound.  Neurological: Negative for syncope, weakness and headaches.  Rest see pertinent positives and negatives per HPI.   Current Outpatient  Medications on File Prior to Visit  Medication Sig Dispense Refill  . levonorgestrel-ethinyl estradiol (SEASONALE) 0.15-0.03 MG tablet Take 1 tablet by mouth daily. 1 Package 0   No current facility-administered medications on file prior to visit.     Past Medical History:  Diagnosis Date  . Bronchitis 10/2013  . Depression   . Low vitamin D level   . Right ovarian cyst 2015   Calcified cyst - possible dermoid.  Needs yearly ultrasound.   No Known Allergies  Social History   Socioeconomic History  . Marital status: Single    Spouse name: Not on file  . Number of children: 0  . Years of education: Not on file  . Highest education level: Not on file  Occupational History  . Not on file  Social Needs  . Financial resource strain: Not on file  . Food insecurity    Worry: Not on file    Inability: Not on file  . Transportation needs    Medical: Not on file    Non-medical: Not on file  Tobacco Use  . Smoking status: Never Smoker  . Smokeless tobacco: Never Used  Substance and Sexual Activity  . Alcohol use: Yes    Alcohol/week: 1.0 - 2.0 standard drinks    Types: 1 - 2 Standard drinks or equivalent per week  . Drug use: No  . Sexual activity: Yes    Partners: Male  Lifestyle  . Physical activity    Days  per week: Not on file    Minutes per session: Not on file  . Stress: Not on file  Relationships  . Social Herbalist on phone: Not on file    Gets together: Not on file    Attends religious service: Not on file    Active member of club or organization: Not on file    Attends meetings of clubs or organizations: Not on file    Relationship status: Not on file  Other Topics Concern  . Not on file  Social History Narrative  . Not on file    Vitals:   09/09/19 1213  BP: (!) 145/102  Pulse: 74  Resp: 12  Temp: (!) 97.4 F (36.3 C)  SpO2: 98%   Body mass index is 31.99 kg/m. Wt Readings from Last 3 Encounters:  09/09/19 182 lb (82.6 kg)   08/22/19 181 lb 6.4 oz (82.3 kg)  05/30/19 187 lb (84.8 kg)    Physical Exam  Nursing note and vitals reviewed. Constitutional: She is oriented to person, place, and time. She appears well-developed. No distress.  HENT:  Head: Normocephalic and atraumatic.  Eyes: Pupils are equal, round, and reactive to light. Conjunctivae are normal.  Cardiovascular: Normal rate and regular rhythm.  No murmur heard. Pulses:      Posterior tibial pulses are 2+ on the right side and 2+ on the left side.  Respiratory: Effort normal and breath sounds normal. No respiratory distress.  GI: Soft. She exhibits no mass. There is no hepatomegaly. There is no abdominal tenderness.  Musculoskeletal:        General: No edema.     Left hip: She exhibits normal range of motion, normal strength and no tenderness.     Lumbar back: She exhibits no tenderness and no bony tenderness.  Lymphadenopathy:    She has no cervical adenopathy.  Neurological: She is alert and oriented to person, place, and time. She has normal strength. No cranial nerve deficit. Gait normal.  SLR elicits pain,left.  Skin: Skin is warm. No rash noted. No erythema.  Psychiatric: She has a normal mood and affect.  Well groomed, good eye contact.   ASSESSMENT AND PLAN:  Courtney Donaldson was seen today for leg pain.  Diagnoses and all orders for this visit:  Left leg pain Improving. We discussed possible etiologies. It seems to be caused by radiculopathy. Because of elevated BP, I am not recommending prednisone taper. We discussed side effects of NSAIDs, so recommend stopping the meloxicam. Acetaminophen 650 mg 3-4 times per day. After verbal consent, she agrees with Depo-Medrol 40 mg IM. Instructed about warning signs. Follow-up in 2 months, before if needed.  -     methylPREDNISolone acetate (DEPO-MEDROL) injection 40 mg  Numbness of left foot ? Radiculopathy. For now I am holding on blood work. Since it is improving,I do not think  imaging is needed at this time. Instructed about warning signs. F/U in 2 months.  Elevated blood pressure reading Re-checked 150/100. We discussed diagnostic criteria for hypertension. We discussed possible complications of elevated BP.  She is now interested in pharmacologic treatment.  Low-salt/DASH diet recommended. Wt loss will also help. Monitor BP at home. Clearly instructed about warning signs.  She is planning on having hysterectomy due to DUB on 10/21/19.  Class 2 obesity due to excess calories without serious comorbidity with body mass index (BMI) of 35.0 to 35.9 in adult We discussed benefits of wt loss as well as adverse effects  of obesity. Consistency with healthy diet and physical activity recommended.    Return in about 2 months (around 11/09/2019) for BP and leg pain.Marland Kitchen     G. Martinique, MD  [facility]. [place] office.

## 2019-09-09 NOTE — Patient Instructions (Addendum)
A few things to remember from today's visit:   Left leg pain  Numbness of left foot  Elevated blood pressure reading Leg pain seems to be doing better.  Stop meloxicam. You can take over-the-counter acetaminophen 650 mg 3 times per day. Avoid prolonged sitting or prolonged standing. Because your elevated blood pressure, I do not feel comfortable with oral prednisone.  Low-salt diet. Monitor blood pressure at home.  DASH Eating Plan DASH stands for "Dietary Approaches to Stop Hypertension." The DASH eating plan is a healthy eating plan that has been shown to reduce high blood pressure (hypertension). It may also reduce your risk for type 2 diabetes, heart disease, and stroke. The DASH eating plan may also help with weight loss. What are tips for following this plan?  General guidelines  Avoid eating more than 2,300 mg (milligrams) of salt (sodium) a day. If you have hypertension, you may need to reduce your sodium intake to 1,500 mg a day.  Limit alcohol intake to no more than 1 drink a day for nonpregnant women and 2 drinks a day for men. One drink equals 12 oz of beer, 5 oz of wine, or 1 oz of hard liquor.  Work with your health care provider to maintain a healthy body weight or to lose weight. Ask what an ideal weight is for you.  Get at least 30 minutes of exercise that causes your heart to beat faster (aerobic exercise) most days of the week. Activities may include walking, swimming, or biking.  Work with your health care provider or diet and nutrition specialist (dietitian) to adjust your eating plan to your individual calorie needs. Reading food labels   Check food labels for the amount of sodium per serving. Choose foods with less than 5 percent of the Daily Value of sodium. Generally, foods with less than 300 mg of sodium per serving fit into this eating plan.  To find whole grains, look for the word "whole" as the first word in the ingredient list. Shopping  Buy  products labeled as "low-sodium" or "no salt added."  Buy fresh foods. Avoid canned foods and premade or frozen meals. Cooking  Avoid adding salt when cooking. Use salt-free seasonings or herbs instead of table salt or sea salt. Check with your health care provider or pharmacist before using salt substitutes.  Do not fry foods. Cook foods using healthy methods such as baking, boiling, grilling, and broiling instead.  Cook with heart-healthy oils, such as olive, canola, soybean, or sunflower oil. Meal planning  Eat a balanced diet that includes: ? 5 or more servings of fruits and vegetables each day. At each meal, try to fill half of your plate with fruits and vegetables. ? Up to 6-8 servings of whole grains each day. ? Less than 6 oz of lean meat, poultry, or fish each day. A 3-oz serving of meat is about the same size as a deck of cards. One egg equals 1 oz. ? 2 servings of low-fat dairy each day. ? A serving of nuts, seeds, or beans 5 times each week. ? Heart-healthy fats. Healthy fats called Omega-3 fatty acids are found in foods such as flaxseeds and coldwater fish, like sardines, salmon, and mackerel.  Limit how much you eat of the following: ? Canned or prepackaged foods. ? Food that is high in trans fat, such as fried foods. ? Food that is high in saturated fat, such as fatty meat. ? Sweets, desserts, sugary drinks, and other foods with added sugar. ?  Full-fat dairy products.  Do not salt foods before eating.  Try to eat at least 2 vegetarian meals each week.  Eat more home-cooked food and less restaurant, buffet, and fast food.  When eating at a restaurant, ask that your food be prepared with less salt or no salt, if possible. What foods are recommended? The items listed may not be a complete list. Talk with your dietitian about what dietary choices are best for you. Grains Whole-grain or whole-wheat bread. Whole-grain or whole-wheat pasta. Brown rice. [place] Morrow.  Bulgur. Whole-grain and low-sodium cereals. Pita bread. Low-fat, low-sodium crackers. Whole-wheat flour tortillas. Vegetables Fresh or frozen vegetables (raw, steamed, roasted, or grilled). Low-sodium or reduced-sodium tomato and vegetable juice. Low-sodium or reduced-sodium tomato sauce and tomato paste. Low-sodium or reduced-sodium canned vegetables. Fruits All fresh, dried, or frozen fruit. Canned fruit in natural juice (without added sugar). Meat and other protein foods Skinless chicken or [place]. Ground chicken or [place]. Pork with fat trimmed off. Fish and seafood. Egg whites. Dried beans, peas, or lentils. Unsalted nuts, nut butters, and seeds. Unsalted canned beans. Lean cuts of beef with fat trimmed off. Low-sodium, lean deli meat. Dairy Low-fat (1%) or fat-free (skim) milk. Fat-free, low-fat, or reduced-fat cheeses. Nonfat, low-sodium ricotta or cottage cheese. Low-fat or nonfat yogurt. Low-fat, low-sodium cheese. Fats and oils Soft margarine without trans fats. Vegetable oil. Low-fat, reduced-fat, or light mayonnaise and salad dressings (reduced-sodium). Canola, safflower, olive, soybean, and sunflower oils. Avocado. Seasoning and other foods Herbs. Spices. Seasoning mixes without salt. Unsalted popcorn and pretzels. Fat-free sweets. What foods are not recommended? The items listed may not be a complete list. Talk with your dietitian about what dietary choices are best for you. Grains Baked goods made with fat, such as croissants, muffins, or some breads. Dry pasta or rice meal packs. Vegetables Creamed or fried vegetables. Vegetables in a cheese sauce. Regular canned vegetables (not low-sodium or reduced-sodium). Regular canned tomato sauce and paste (not low-sodium or reduced-sodium). Regular tomato and vegetable juice (not low-sodium or reduced-sodium). Angie Fava. Olives. Fruits Canned fruit in a light or heavy syrup. Fried fruit. Fruit in cream or butter sauce. Meat and other  protein foods Fatty cuts of meat. Ribs. Fried meat. Berniece Salines. Sausage. Bologna and other processed lunch meats. Salami. Fatback. Hotdogs. Bratwurst. Salted nuts and seeds. Canned beans with added salt. Canned or smoked fish. Whole eggs or egg yolks. Chicken or [place] with skin. Dairy Whole or 2% milk, cream, and half-and-half. Whole or full-fat cream cheese. Whole-fat or sweetened yogurt. Full-fat cheese. Nondairy creamers. Whipped toppings. Processed cheese and cheese spreads. Fats and oils Butter. Stick margarine. Lard. Shortening. Ghee. Bacon fat. Tropical oils, such as coconut, palm kernel, or palm oil. Seasoning and other foods Salted popcorn and pretzels. Onion salt, garlic salt, seasoned salt, table salt, and sea salt. Worcestershire sauce. Tartar sauce. Barbecue sauce. Teriyaki sauce. Soy sauce, including reduced-sodium. Steak sauce. Canned and packaged gravies. Fish sauce. Oyster sauce. Cocktail sauce. Horseradish that you find on the shelf. Ketchup. Mustard. Meat flavorings and tenderizers. Bouillon cubes. Hot sauce and Tabasco sauce. Premade or packaged marinades. Premade or packaged taco seasonings. Relishes. Regular salad dressings. Where to find more information:  National Heart, Lung, and [place]: https://wilson-eaton.com/  American Heart Association: www.heart.org Summary  The DASH eating plan is a healthy eating plan that has been shown to reduce high blood pressure (hypertension). It may also reduce your risk for type 2 diabetes, heart disease, and stroke.  With the DASH eating plan, you  should limit salt (sodium) intake to 2,300 mg a day. If you have hypertension, you may need to reduce your sodium intake to 1,500 mg a day.  When on the DASH eating plan, aim to eat more fresh fruits and vegetables, whole grains, lean proteins, low-fat dairy, and heart-healthy fats.  Work with your health care provider or diet and nutrition specialist (dietitian) to adjust your eating plan to your  individual calorie needs. This information is not intended to replace advice given to you by your health care provider. Make sure you discuss any questions you have with your health care provider. Document Released: 10/06/2011 Document Revised: 09/29/2017 Document Reviewed: 10/10/2016 Elsevier Patient Education  [place].   Please be sure medication list is accurate. If a new problem present, please set up appointment sooner than planned today.

## 2019-09-19 ENCOUNTER — Other Ambulatory Visit: Payer: Self-pay

## 2019-09-19 ENCOUNTER — Ambulatory Visit: Payer: Self-pay

## 2019-09-19 NOTE — Telephone Encounter (Signed)
Pt. Reports BP has been high x 2 days. This morning it was 149/105. Now it is 165/115. Had headache and left arm "tightness." None today. No chest pain. Warm transfer to [facility] in the practice for a visit.  Reason for Disposition . Systolic BP  >= 99991111 OR Diastolic >= [id number]  Answer Assessment - Initial Assessment Questions 1. BLOOD PRESSURE: "What is the blood pressure?" "Did you take at least two measurements 5 minutes apart?"     149/105      Now - 165/115 2. ONSET: "When did you take your blood pressure?"     0830 3. HOW: "How did you obtain the blood pressure?" (e.g., visiting nurse, automatic home BP monitor)     Home cuff 4. HISTORY: "Do you have a history of high blood pressure?"     No 5. MEDICATIONS: "Are you taking any medications for blood pressure?" "Have you missed any doses recently?"     Not on BP meds 6. OTHER SYMPTOMS: "Do you have any symptoms?" (e.g., headache, chest pain, blurred vision, difficulty breathing, weakness)     Headache yesterday, left arm feels tight 7. PREGNANCY: "Is there any chance you are pregnant?" "When was your last menstrual period?"     Yes  Protocols used: HIGH BLOOD PRESSURE-A-AH

## 2019-09-19 NOTE — Telephone Encounter (Signed)
Patient scheduled to see Dr. Martinique at 11 AM on 09/20/2019

## 2019-09-20 ENCOUNTER — Ambulatory Visit: Payer: 59 | Admitting: Family Medicine

## 2019-09-20 ENCOUNTER — Encounter: Payer: Self-pay | Admitting: Family Medicine

## 2019-09-20 VITALS — BP 140/90 | HR 73 | Temp 97.8°F | Resp 12 | Ht 63.25 in | Wt 179.0 lb

## 2019-09-20 DIAGNOSIS — G4726 Circadian rhythm sleep disorder, shift work type: Secondary | ICD-10-CM | POA: Diagnosis not present

## 2019-09-20 DIAGNOSIS — Z6835 Body mass index (BMI) 35.0-35.9, adult: Secondary | ICD-10-CM

## 2019-09-20 DIAGNOSIS — E6609 Other obesity due to excess calories: Secondary | ICD-10-CM | POA: Diagnosis not present

## 2019-09-20 DIAGNOSIS — I10 Essential (primary) hypertension: Secondary | ICD-10-CM | POA: Insufficient documentation

## 2019-09-20 DIAGNOSIS — R002 Palpitations: Secondary | ICD-10-CM | POA: Diagnosis not present

## 2019-09-20 DIAGNOSIS — N938 Other specified abnormal uterine and vaginal bleeding: Secondary | ICD-10-CM

## 2019-09-20 MED ORDER — AMLODIPINE BESY-BENAZEPRIL HCL 5-10 MG PO CAPS: 1.0000 | ORAL_CAPSULE | Freq: Every day | ORAL | 1 refills | Status: DC

## 2019-09-20 MED ORDER — ZOLPIDEM TARTRATE 5 MG PO TABS: 2.5000 mg | ORAL_TABLET | Freq: Every evening | ORAL | 1 refills | Status: DC | PRN

## 2019-09-20 NOTE — Telephone Encounter (Signed)
Noted. Will send to Dr. Martinique as an Juluis Rainier

## 2019-09-20 NOTE — Assessment & Plan Note (Signed)
She has lost about 3 lb since her last visit. We discussed benefits of wt loss as well as adverse effects of obesity. Consistency with healthy diet and physical activity recommended. Daily brisk walking for 15-30 min as tolerated.

## 2019-09-20 NOTE — Patient Instructions (Signed)
A few things to remember from today's visit:   Hypertension, essential, benign - Plan: amLODipine-benazepril (LOTREL) 5-10 MG capsule  Heart palpitations - Plan: EKG 12-Lead  Shift work sleep disorder - Plan: zolpidem (AMBIEN) 5 MG tablet  Managing Your Hypertension Hypertension is commonly called high blood pressure. This is when the force of your blood pressing against the walls of your arteries is too strong. Arteries are blood vessels that carry blood from your heart throughout your body. Hypertension forces the heart to work harder to pump blood, and may cause the arteries to become narrow or stiff. Having untreated or uncontrolled hypertension can cause heart attack, stroke, kidney disease, and other problems. What are blood pressure readings? A blood pressure reading consists of a higher number over a lower number. Ideally, your blood pressure should be below 120/80. The first ("top") number is called the systolic pressure. It is a measure of the pressure in your arteries as your heart beats. The second ("bottom") number is called the diastolic pressure. It is a measure of the pressure in your arteries as the heart relaxes. What does my blood pressure reading mean? Blood pressure is classified into four stages. Based on your blood pressure reading, your health care provider may use the following stages to determine what type of treatment you need, if any. Systolic pressure and diastolic pressure are measured in a unit called mm Hg. Normal  Systolic pressure: below 123456.  Diastolic pressure: below 80. Elevated  Systolic pressure: [id number].  Diastolic pressure: below 80. Hypertension stage 1  Systolic pressure: 0000000.  Diastolic pressure: [id number]. Hypertension stage 2  Systolic pressure: [id number] or above.  Diastolic pressure: 90 or above. What health risks are associated with hypertension? Managing your hypertension is an important responsibility. Uncontrolled hypertension can lead  to:  A heart attack.  A stroke.  A weakened blood vessel (aneurysm).  Heart failure.  Kidney damage.  Eye damage.  Metabolic syndrome.  Memory and concentration problems. What changes can I make to manage my hypertension? Hypertension can be managed by making lifestyle changes and possibly by taking medicines. Your health care provider will help you make a plan to bring your blood pressure within a normal range. Eating and drinking   Eat a diet that is high in fiber and potassium, and low in salt (sodium), added sugar, and fat. An example eating plan is called the DASH (Dietary Approaches to Stop Hypertension) diet. To eat this way: ? Eat plenty of fresh fruits and vegetables. Try to fill half of your plate at each meal with fruits and vegetables. ? Eat whole grains, such as whole wheat pasta, brown rice, or whole grain bread. Fill about one quarter of your plate with whole grains. ? Eat low-fat diary products. ? Avoid fatty cuts of meat, processed or cured meats, and poultry with skin. Fill about one quarter of your plate with lean proteins such as fish, chicken without skin, beans, eggs, and tofu. ? Avoid premade and processed foods. These tend to be higher in sodium, added sugar, and fat.  Reduce your daily sodium intake. Most people with hypertension should eat less than 1,500 mg of sodium a day.  Limit alcohol intake to no more than 1 drink a day for nonpregnant women and 2 drinks a day for men. One drink equals 12 oz of beer, 5 oz of wine, or 1 oz of hard liquor. Lifestyle  Work with your health care provider to maintain a healthy body weight, or to lose weight.  Ask what an ideal weight is for you.  Get at least 30 minutes of exercise that causes your heart to beat faster (aerobic exercise) most days of the week. Activities may include walking, swimming, or biking.  Include exercise to strengthen your muscles (resistance exercise), such as weight lifting, as part of your  weekly exercise routine. Try to do these types of exercises for 30 minutes at least 3 days a week.  Do not use any products that contain nicotine or tobacco, such as cigarettes and e-cigarettes. If you need help quitting, ask your health care provider.  Control any long-term (chronic) conditions you have, such as high cholesterol or diabetes. Monitoring  Monitor your blood pressure at home as told by your health care provider. Your personal target blood pressure may vary depending on your medical conditions, your age, and other factors.  Have your blood pressure checked regularly, as often as told by your health care provider. Working with your health care provider  Review all the medicines you take with your health care provider because there may be side effects or interactions.  Talk with your health care provider about your diet, exercise habits, and other lifestyle factors that may be contributing to hypertension.  Visit your health care provider regularly. Your health care provider can help you create and adjust your plan for managing hypertension. Will I need medicine to control my blood pressure? Your health care provider may prescribe medicine if lifestyle changes are not enough to get your blood pressure under control, and if:  Your systolic blood pressure is 130 or higher.  Your diastolic blood pressure is 80 or higher. Take medicines only as told by your health care provider. Follow the directions carefully. Blood pressure medicines must be taken as prescribed. The medicine does not work as well when you skip doses. Skipping doses also puts you at risk for problems. Contact a health care provider if:  You think you are having a reaction to medicines you have taken.  You have repeated (recurrent) headaches.  You feel dizzy.  You have swelling in your ankles.  You have trouble with your vision. Get help right away if:  You develop a severe headache or confusion.  You  have unusual weakness or numbness, or you feel faint.  You have severe pain in your chest or abdomen.  You vomit repeatedly.  You have trouble breathing. Summary  Hypertension is when the force of blood pumping through your arteries is too strong. If this condition is not controlled, it may put you at risk for serious complications.  Your personal target blood pressure may vary depending on your medical conditions, your age, and other factors. For most people, a normal blood pressure is less than 120/80.  Hypertension is managed by lifestyle changes, medicines, or both. Lifestyle changes include weight loss, eating a healthy, low-sodium diet, exercising more, and limiting alcohol. This information is not intended to replace advice given to you by your health care provider. Make sure you discuss any questions you have with your health care provider. Document Released: 07/11/2012 Document Revised: 02/08/2019 Document Reviewed: 09/14/2016 Elsevier Patient Education  [place].   Please be sure medication list is accurate. If a new problem present, please set up appointment sooner than planned today.

## 2019-09-20 NOTE — Progress Notes (Signed)
ACUTE VISIT   HPI:  Chief Complaint  Patient presents with  . Hypertension    Courtney Donaldson is a 43 y.o. female, who is here today complaining of elevated BP.  She was last seen on 09/09/2019, when BP was elevated at 145/102. She has been monitoring BP at home: 140's-160's/100's. Yesterday she had a global headache while she was at work, and felt "sluggish." + Stress. Denies visual changes, chest pain, dyspnea,claudication, focal weakness, or edema.  "Heart racing" x 1 yesterday. No associated symptoms.  Planning on having hysterectomy in a couple months. She has had vaginal bleeding in between menses for about 2 months. It has improved with OCP's. Denies pica.  Difficulty sleeping. She changes work shift weekly. Difficulty falling ans staying asleep. She tried her sister's Ambien and it helped. Not sure about exacerbating factors.  She has not tried OTC sleep aids.   Review of Systems  Constitutional: Positive for fatigue. Negative for activity change, appetite change and fever.  HENT: Negative for mouth sores, nosebleeds and trouble swallowing.   Eyes: Negative for redness and visual disturbance.  Respiratory: Negative for cough and wheezing.   Gastrointestinal: Negative for abdominal pain, nausea and vomiting.       Negative for changes in bowel habits.  Endocrine: Negative for cold intolerance and heat intolerance.  Genitourinary: Negative for decreased urine volume, dysuria, hematuria and vaginal discharge.  Musculoskeletal: Negative for gait problem and myalgias.  Neurological: Negative for syncope, facial asymmetry and speech difficulty.  Psychiatric/Behavioral: Negative for confusion, hallucinations and suicidal ideas. The patient is nervous/anxious.   Rest see pertinent positives and negatives per HPI.   Current Outpatient Medications on File Prior to Visit  Medication Sig Dispense Refill  . levonorgestrel-ethinyl estradiol (SEASONALE)  0.15-0.03 MG tablet Take 1 tablet by mouth daily. 1 Package 0   No current facility-administered medications on file prior to visit.    Past Medical History:  Diagnosis Date  . Bronchitis 10/2013  . Depression   . Low vitamin D level   . Right ovarian cyst 2015   Calcified cyst - possible dermoid.  Needs yearly ultrasound.   No Known Allergies  Social History   Socioeconomic History  . Marital status: Single    Spouse name: Not on file  . Number of children: 0  . Years of education: Not on file  . Highest education level: Not on file  Occupational History  . Not on file  Social Needs  . Financial resource strain: Not on file  . Food insecurity    Worry: Not on file    Inability: Not on file  . Transportation needs    Medical: Not on file    Non-medical: Not on file  Tobacco Use  . Smoking status: Never Smoker  . Smokeless tobacco: Never Used  Substance and Sexual Activity  . Alcohol use: Yes    Alcohol/week: 1.0 - 2.0 standard drinks    Types: 1 - 2 Standard drinks or equivalent per week  . Drug use: No  . Sexual activity: Yes    Partners: Male  Lifestyle  . Physical activity    Days per week: Not on file    Minutes per session: Not on file  . Stress: Not on file  Relationships  . Social Herbalist on phone: Not on file    Gets together: Not on file    Attends religious service: Not on file    Active member  of club or organization: Not on file    Attends meetings of clubs or organizations: Not on file    Relationship status: Not on file  Other Topics Concern  . Not on file  Social History Narrative  . Not on file    Vitals:   09/20/19 1102  BP: 140/90  Pulse: 73  Resp: 12  Temp: 97.8 F (36.6 C)  SpO2: 98%   Body mass index is 31.46 kg/m. Wt Readings from Last 3 Encounters:  09/20/19 179 lb (81.2 kg)  09/09/19 182 lb (82.6 kg)  08/22/19 181 lb 6.4 oz (82.3 kg)    Physical Exam  Nursing note and vitals reviewed. Constitutional:  She is oriented to person, place, and time. She appears well-developed. No distress.  HENT:  Head: Normocephalic and atraumatic.  Mouth/Throat: Oropharynx is clear and moist and mucous membranes are normal.  Eyes: Pupils are equal, round, and reactive to light. Conjunctivae are normal.  Cardiovascular: Normal rate and regular rhythm.  Occasional extrasystoles are present.  No murmur heard. Pulses:      Dorsalis pedis pulses are 2+ on the right side and 2+ on the left side.  Respiratory: Effort normal and breath sounds normal. No respiratory distress.  GI: Soft. She exhibits no mass. There is no hepatomegaly. There is no abdominal tenderness.  Genitourinary:    Genitourinary Comments: Deferred to gyn   Musculoskeletal:        General: No edema.  Lymphadenopathy:    She has no cervical adenopathy.  Neurological: She is alert and oriented to person, place, and time. She has normal strength. No cranial nerve deficit. Gait normal.  Skin: Skin is warm. No rash noted. No erythema.  Psychiatric: Her mood appears anxious.  Well groomed, good eye contact.   ASSESSMENT AND PLAN:  Courtney Donaldson was seen today for hypertension.  Diagnoses and all orders for this visit:  Orders Placed This Encounter  Procedures  . CBC  . Basic Metabolic Panel  . EKG 12-Lead     Class 2 obesity with body mass index (BMI) of 35.0 to 35.9 in adult She has lost about 3 lb since her last visit. We discussed benefits of wt loss as well as adverse effects of obesity. Consistency with healthy diet and physical activity recommended. Daily brisk walking for 15-30 min as tolerated.   Circadian rhythm sleep disturbance We discussed pharmacologic options. She would like Ambien,side effects discussed. Ambien 2.5-5 mg to take at bedtime as needed started today. We discussed side effects of medications, she must dedicate at least 8 hours for his sleep. Good sleep hygiene. Follow-up in 4 weeks.    Hypertension,  essential, benign Problem is not adequately controlled. We discussed possible complications of elevated BP. After discussion of available pharmacologic options, she agrees with trying Lotrel 5-10 mg daily.  She understands the risk if pregnancy occurs, she is planning on hysterectomy next month.  Low-salt diet recommended. Continue monitoring BP regularly. Instructed about warning signs. BMP in 7 to 10 days. Follow-up in 4 weeks.  Heart palpitations Hx does not suggest a serious process. EKG today:Mild bradycardia, LAD, and unspecific T wave abnormalities. No other EKG for comparison. For now I do not think further cardiac studies are needed. Instructed about warning signs.  DUB (dysfunctional uterine bleeding) Some side effects of OCP's discussed,including HTN. Following with gyn,pending hysterectomy.   Return in about 4 weeks (around 10/18/2019) for Labs in 7-10 days..   Koen Antilla G. Martinique, MD  [facility]. [place]  office.

## 2019-09-20 NOTE — Assessment & Plan Note (Addendum)
Problem is not adequately controlled. We discussed possible complications of elevated BP. After discussion of available pharmacologic options, she agrees with trying Lotrel 5-10 mg daily.  She understands the risk if pregnancy occurs, she is planning on hysterectomy next month.  Low-salt diet recommended. Continue monitoring BP regularly. Instructed about warning signs. BMP in 7 to 10 days. Follow-up in 4 weeks.

## 2019-09-20 NOTE — Assessment & Plan Note (Signed)
We discussed pharmacologic options. She would like Ambien,side effects discussed. Ambien 2.5-5 mg to take at bedtime as needed started today. We discussed side effects of medications, she must dedicate at least 8 hours for his sleep. Good sleep hygiene. Follow-up in 4 weeks.

## 2019-09-23 ENCOUNTER — Telehealth: Payer: Self-pay | Admitting: Obstetrics and Gynecology

## 2019-09-23 NOTE — Telephone Encounter (Signed)
She is returning a call to [place].

## 2019-09-23 NOTE — Telephone Encounter (Signed)
Spoke with patient. Scheduled for TLH on 10/21/19 with [name]. [id number] testing is scheduled for 10/17/19 at [facility]. Patient states Jeral Fruit is case manager completing her short-term disability forms, calling  to confirm quarantine instructions prior to surgery. Advised patient she will need to self quarantine after Covid19 testing until surgery completed. Advised RN will return call to review instructions.   Offered earlier surgery date of 10/14/19, patient declined. Patient verbalizes understanding and is agreeable.   Call placed to Jody, confirmed Covid19 testing date and quarantine instructions.   Routing to provider for final review. Patient is agreeable to disposition. Will close encounter.   CC: Lamont Snowball, RN  Encounter closed.

## 2019-09-23 NOTE — Telephone Encounter (Signed)
Left message to call Vaden Becherer, RN at [facility] [phone].   

## 2019-09-23 NOTE — Telephone Encounter (Signed)
Jody from [facility] calling to review Covid guidelines prior to surgery for the patient.

## 2019-09-30 ENCOUNTER — Other Ambulatory Visit: Payer: Self-pay

## 2019-10-01 ENCOUNTER — Encounter: Payer: Self-pay | Admitting: Obstetrics and Gynecology

## 2019-10-01 ENCOUNTER — Other Ambulatory Visit (INDEPENDENT_AMBULATORY_CARE_PROVIDER_SITE_OTHER): Payer: 59

## 2019-10-01 ENCOUNTER — Ambulatory Visit (INDEPENDENT_AMBULATORY_CARE_PROVIDER_SITE_OTHER): Payer: 59 | Admitting: Obstetrics and Gynecology

## 2019-10-01 VITALS — BP 124/80 | HR 76 | Temp 97.3°F | Ht 63.25 in | Wt 178.0 lb

## 2019-10-01 DIAGNOSIS — N938 Other specified abnormal uterine and vaginal bleeding: Secondary | ICD-10-CM | POA: Diagnosis not present

## 2019-10-01 DIAGNOSIS — I10 Essential (primary) hypertension: Secondary | ICD-10-CM

## 2019-10-01 DIAGNOSIS — N939 Abnormal uterine and vaginal bleeding, unspecified: Secondary | ICD-10-CM

## 2019-10-01 DIAGNOSIS — N83201 Unspecified ovarian cyst, right side: Secondary | ICD-10-CM

## 2019-10-01 LAB — BASIC METABOLIC PANEL
BUN: 18 mg/dL (ref 6–23)
CO2: 25 mEq/L (ref 19–32)
Calcium: 9.5 mg/dL (ref 8.4–10.5)
Chloride: 104 mEq/L (ref 96–112)
Creatinine, Ser: 0.87 mg/dL (ref 0.40–1.20)
GFR: 86.24 mL/min (ref >60.00)
Glucose, Bld: 90 mg/dL (ref 70–99)
Potassium: 4.4 mEq/L (ref 3.5–5.1)
Sodium: 137 mEq/L (ref 135–145)

## 2019-10-01 LAB — CBC
HCT: 41.1 % (ref 36.0–46.0)
Hemoglobin: 13.1 g/dL (ref 12.0–15.0)
MCHC: 32 g/dL (ref 30.0–36.0)
MCV: 94.9 fl (ref 78.0–100.0)
Platelets: 342 10*3/uL (ref 150.0–400.0)
RBC: 4.33 Mil/uL (ref 3.87–5.11)
RDW: 12.4 % (ref 11.5–15.5)
WBC: 5.4 10*3/uL (ref 4.0–10.5)

## 2019-10-01 NOTE — Progress Notes (Signed)
GYNECOLOGY  VISIT   HPI: 43 y.o.   Single  African American  female   Courtney Donaldson with Patient's last menstrual period was 09/05/2019.here for surgical consult.  She desires hysterectomy for abnormal uterine bleeding.  She declines medical therapy. She declines future childbearing.   Long hx of abnormal uterine bleeding, treated with Mirena IUD,  Aygestin, and most recently Seasonale. States her bleeding is lighter but still bleeding almost daily.  No missed birth control pills.   Denies pain.   Pelvic US on 03/21/19 has shown a normal uterus with EMS 3.67 mm and right ovarian cyst, 24 x 28 mm, probable dermoid.  Her left ovary is normal.  Benign EMB in 2018.   She reports a new dx of HTN and sciatica of her left leg.  States she recently had an injection for the sciatica pain.   She works at [employer] and she does some lifting at work, up to 50 pounds.  She also needs to deal with pallets.   She has Covid testing on 10/17/19.  GYNECOLOGIC HISTORY: Patient's last menstrual period was 09/05/2019. Contraception:  Seasonale. Menopausal hormone therapy:  none Last mammogram: 10/17/17 BIRADS 1 negative/density b--appt. 10-09-19 Last pap smear: 03/20/19 Neg:Neg HR HPV, 02-22-16 Neg:Neg HR HPV, 03-08-13 Neg:Neg HR HPV        OB History    Gravida  0   Para  0   Term  0   Preterm  0   AB  0   Living  0     SAB  0   TAB  0   Ectopic  0   Multiple  0   Live Births  0              Patient Active Problem List   Diagnosis Date Noted  . Hypertension, essential, benign 09/20/2019  . Cough, persistent 02/26/2019  . Allergic rhinitis 02/26/2019  . Ovarian mass, right 04/28/2018  . GERD (gastroesophageal reflux disease) 04/02/2018  . Circadian rhythm sleep disturbance 04/02/2018  . Hyperlipidemia 04/02/2018  . IUD (intrauterine device) in place 05/17/2013  . Class 2 obesity with body mass index (BMI) of 35.0 to 35.9 in adult 03/08/2013    Past Medical History:   Diagnosis Date  . Bronchitis 10/2013  . Depression   . Low vitamin D level   . Right ovarian cyst 2015   Calcified cyst - possible dermoid.  Needs yearly ultrasound.    Past Surgical History:  Procedure Laterality Date  . INTRAUTERINE DEVICE (IUD) INSERTION  04/11/13   Mirena    Current Outpatient Medications  Medication Sig Dispense Refill  . amLODipine-benazepril (LOTREL) 5-10 MG capsule Take 1 capsule by mouth daily. 30 capsule 1  . levonorgestrel-ethinyl estradiol (SEASONALE) 0.15-0.03 MG tablet Take 1 tablet by mouth daily. 1 Package 0  . zolpidem (AMBIEN) 5 MG tablet Take 0.5-1 tablets (2.5-5 mg total) by mouth at bedtime as needed for sleep. 15 tablet 1   No current facility-administered medications for this visit.      ALLERGIES: Patient has no known allergies.  Family History  Problem Relation Age of Onset  . Diabetes Mother   . Pancreatic cancer Paternal Aunt 65  . Breast cancer Paternal Aunt   . Diabetes Maternal Grandmother   . Diabetes Maternal Grandfather   . Cancer Maternal Grandfather   . Obesity Maternal Grandfather   . Breast cancer Paternal Grandmother 40       postmenopausal  . Cancer Paternal Grandmother   .  Hypertension Father   . Early death Father   . Hyperlipidemia Father   . Kidney disease Father   . Depression Sister   . Hypertension Sister   . Heart attack Paternal Grandfather   . Kidney failure Other 44    Social History   Socioeconomic History  . Marital status: Single    Spouse name: Not on file  . Number of children: 0  . Years of education: Not on file  . Highest education level: Not on file  Occupational History  . Not on file  Social Needs  . Financial resource strain: Not on file  . Food insecurity    Worry: Not on file    Inability: Not on file  . Transportation needs    Medical: Not on file    Non-medical: Not on file  Tobacco Use  . Smoking status: Never Smoker  . Smokeless tobacco: Never Used  Substance and  Sexual Activity  . Alcohol use: Yes    Alcohol/week: 1.0 - 2.0 standard drinks    Types: 1 - 2 Standard drinks or equivalent per week  . Drug use: No  . Sexual activity: Yes    Partners: Male  Lifestyle  . Physical activity    Days per week: Not on file    Minutes per session: Not on file  . Stress: Not on file  Relationships  . Social Herbalist on phone: Not on file    Gets together: Not on file    Attends religious service: Not on file    Active member of club or organization: Not on file    Attends meetings of clubs or organizations: Not on file    Relationship status: Not on file  . Intimate partner violence    Fear of current or ex partner: Not on file    Emotionally abused: Not on file    Physically abused: Not on file    Forced sexual activity: Not on file  Other Topics Concern  . Not on file  Social History Narrative  . Not on file    Review of Systems  All other systems reviewed and are negative.   PHYSICAL EXAMINATION:    BP 124/80 (Cuff Size: Large)   Pulse 76   Temp (!) 97.3 F (36.3 C) (Temporal)   Ht 5' 3.25" (1.607 m)   Wt 178 lb (80.7 kg)   LMP 09/05/2019 Comment: DUB on OCP  BMI 31.28 kg/m     General appearance: alert, cooperative and appears stated age Head: Normocephalic, without obvious abnormality, atraumatic Neck: no adenopathy, supple, symmetrical, trachea midline and thyroid normal to inspection and palpation Lungs: clear to auscultation bilaterally Heart: regular rate and rhythm Abdomen: soft, non-tender, no masses,  no organomegaly Extremities: extremities normal, atraumatic, no cyanosis or edema Skin: Skin color, texture, turgor normal. No rashes or lesions Lymph nodes: Cervical, supraclavicular, and axillary nodes normal. No abnormal inguinal nodes palpated Neurologic: Grossly normal  Pelvic: External genitalia:  no lesions              Urethra:  normal appearing urethra with no masses, tenderness or lesions               Bartholins and Skenes: normal                 Vagina: normal appearing vagina with normal color and discharge, no lesions              Cervix: no lesions  Bimanual Exam:  Uterus:  normal size, contour, position, consistency, mobility, non-tender              Adnexa: no mass, fullness, tenderness               Chaperone was present for exam.  ASSESSMENT  Abnormal uterine bleeding.  Currently on Seasonale. Right ovarian cyst.  Possible dermoid. Recent sciatica of left leg.  New dx of HTN.  May be partially related to her Seasonale.  PLAN  I discussed total laparoscopic hysterectomy with bilateral salpingectomy, right oophorectomy, possible bilateral oophorectomy, and cystoscopy.    I reviewed risks, benefits, and alternatives.  Risks include but are not limited to bleeding, infection, damage to surrounding organs, pneumonia, reaction to anesthesia, DVT, PE, death, need for reoperation, hernia formation, vaginal cuff dehiscence, need to convert to a traditional laparotomy incision to complete the procedure, and menopausal symptoms if bilateral ovaries are removed.       Surgical expectations and recovery discussed.  Off from work for 8 weeks.  Patient wishes to proceed.  She will do a magnesium citrate bowel prep.  She will receive Lovenox for DVT/PE prophylaxis.  Complete mammogram preop.  She will continue her Seasonale until hysterectomy is performed.  Questions invited and answered.  An After Visit Summary was printed and given to the patient.

## 2019-10-08 ENCOUNTER — Encounter: Payer: Self-pay | Admitting: Family Medicine

## 2019-10-09 ENCOUNTER — Other Ambulatory Visit: Payer: Self-pay

## 2019-10-09 ENCOUNTER — Ambulatory Visit
Admission: RE | Admit: 2019-10-09 | Discharge: 2019-10-09 | Disposition: A | Discharge status: Home / Self Care | Payer: 59 | Source: Ambulatory Visit | Attending: Obstetrics and Gynecology | Admitting: Obstetrics and Gynecology

## 2019-10-09 DIAGNOSIS — Z1231 Encounter for screening mammogram for malignant neoplasm of breast: Secondary | ICD-10-CM

## 2019-10-11 ENCOUNTER — Telehealth: Payer: Self-pay | Admitting: Family Medicine

## 2019-10-11 NOTE — Telephone Encounter (Signed)
LMVM for the patient to reschedule virtual appointment due to the provider being out of the office Monday afternoon.

## 2019-10-12 ENCOUNTER — Other Ambulatory Visit: Payer: Self-pay | Admitting: Family Medicine

## 2019-10-12 DIAGNOSIS — I10 Essential (primary) hypertension: Secondary | ICD-10-CM

## 2019-10-14 ENCOUNTER — Telehealth: Payer: 59 | Admitting: Family Medicine

## 2019-10-16 ENCOUNTER — Encounter: Payer: Self-pay | Admitting: Family Medicine

## 2019-10-16 ENCOUNTER — Other Ambulatory Visit: Payer: Self-pay

## 2019-10-16 ENCOUNTER — Ambulatory Visit: Payer: 59 | Admitting: Family Medicine

## 2019-10-16 VITALS — BP 126/80 | HR 60 | Resp 12 | Ht 63.25 in | Wt 182.0 lb

## 2019-10-16 DIAGNOSIS — G4720 Circadian rhythm sleep disorder, unspecified type: Secondary | ICD-10-CM

## 2019-10-16 DIAGNOSIS — G4726 Circadian rhythm sleep disorder, shift work type: Secondary | ICD-10-CM | POA: Diagnosis not present

## 2019-10-16 DIAGNOSIS — I10 Essential (primary) hypertension: Secondary | ICD-10-CM | POA: Diagnosis not present

## 2019-10-16 MED ORDER — AMLODIPINE BESY-BENAZEPRIL HCL 5-10 MG PO CAPS: 1.0000 | ORAL_CAPSULE | Freq: Every day | ORAL | 1 refills | Status: DC

## 2019-10-16 NOTE — Assessment & Plan Note (Signed)
BP better controlled. BP checked with her BP monitor 130/81, it seems to be accurate. No changes in current management. Continue monitoring BP at home. Low-salt diet also to continue. Follow-up in 4 months, before if needed.

## 2019-10-16 NOTE — Progress Notes (Signed)
Courtney Donaldson is a 43 y.o.female, who is here today to follow on HTN. Last follow up visit: 10/20/2019. Currently on amlodipine-benazepril 5-10 mg daily. . She is taking medications as instructed, no side effects reported. Headache has resolved. + fatigue. Negative for visual changes, exertional chest pain, dyspnea,  focal weakness, or edema. Home BP readings: 130/70's   Lab Results  Component Value Date   CREATININE 0.87 10/01/2019   BUN 18 10/01/2019   NA 137 10/01/2019   K 4.4 10/01/2019   CL 104 10/01/2019   CO2 25 10/01/2019   Insomnia: Aggravated by work schedule.  Her shift changes every week. She is taking Ambien prn, 1/2 tab seem to help. Sleeps 7-8 hours. Feels rested next when she gets up. No side effects noted.   Review of Systems  Constitutional: Negative for activity change, appetite change and fever.  HENT: Negative for mouth sores, nosebleeds and trouble swallowing.   Respiratory: Negative for cough and wheezing.   Cardiovascular: Negative for leg swelling.  Gastrointestinal: Negative for abdominal pain, nausea and vomiting.       Negative for changes in bowel habits.  Genitourinary: Negative for decreased urine volume and hematuria.  Neurological: Negative for syncope, facial asymmetry and speech difficulty.  Rest see pertinent positives and negatives per HPI.  Current Outpatient Medications on File Prior to Visit  Medication Sig Dispense Refill  . levonorgestrel-ethinyl estradiol (SEASONALE) 0.15-0.03 MG tablet Take 1 tablet by mouth daily. 1 Package 0  . zolpidem (AMBIEN) 5 MG tablet Take 0.5-1 tablets (2.5-5 mg total) by mouth at bedtime as needed for sleep. 15 tablet 1   No current facility-administered medications on file prior to visit.     Past Medical History:  Diagnosis Date  . Bronchitis 10/2013  . Depression   . Low vitamin D level   . Right ovarian cyst 2015   Calcified cyst - possible dermoid.  Needs yearly  ultrasound.    No Known Allergies  Social History   Socioeconomic History  . Marital status: Single    Spouse name: Not on file  . Number of children: 0  . Years of education: Not on file  . Highest education level: Not on file  Occupational History  . Not on file  Tobacco Use  . Smoking status: Never Smoker  . Smokeless tobacco: Never Used  Substance and Sexual Activity  . Alcohol use: Yes    Alcohol/week: 1.0 - 2.0 standard drinks    Types: 1 - 2 Standard drinks or equivalent per week  . Drug use: No  . Sexual activity: Yes    Partners: Male  Other Topics Concern  . Not on file  Social History Narrative  . Not on file   Social Determinants of Health   Financial Resource Strain:   . Difficulty of Paying Living Expenses: Not on file  Food Insecurity:   . Worried About Charity fundraiser in the Last Year: Not on file  . Ran Out of Food in the Last Year: Not on file  Transportation Needs:   . Lack of Transportation (Medical): Not on file  . Lack of Transportation (Non-Medical): Not on file  Physical Activity:   . Days of Exercise per Week: Not on file  . Minutes of Exercise per Session: Not on file  Stress:   . Feeling of Stress : Not on file  Social Connections:   . Frequency of Communication with Friends and Family: Not on file  .  Frequency of Social Gatherings with Friends and Family: Not on file  . Attends Religious Services: Not on file  . Active Member of Clubs or Organizations: Not on file  . Attends Archivist Meetings: Not on file  . Marital Status: Not on file    Vitals:   10/16/19 1603  BP: 126/80  Pulse: 60  Resp: 12  SpO2: 96%   Body mass index is 31.99 kg/m.  Physical Exam  Nursing note and vitals reviewed. Constitutional: She is oriented to person, place, and time. She appears well-developed. No distress.  HENT:  Head: Normocephalic and atraumatic.  Mouth/Throat: Oropharynx is clear and moist and mucous membranes are normal.   Eyes: Pupils are equal, round, and reactive to light. Conjunctivae are normal.  Cardiovascular: Normal rate and regular rhythm.  No murmur heard. HR between 56-60/min  Respiratory: Effort normal and breath sounds normal. No respiratory distress.  GI: Soft. She exhibits no mass. There is no hepatomegaly. There is no abdominal tenderness.  Musculoskeletal:        General: No edema.  Lymphadenopathy:    She has no cervical adenopathy.  Neurological: She is alert and oriented to person, place, and time. She has normal strength. No cranial nerve deficit. Gait normal.  Skin: Skin is warm. No rash noted. No erythema.  Psychiatric: She has a normal mood and affect.  Well groomed, good eye contact.    ASSESSMENT AND PLAN:   Courtney Donaldson was seen today for follow-up.  Diagnoses and all orders for this visit:  Hypertension, essential, benign BP better controlled. BP checked with her BP monitor 130/81, it seems to be accurate. No changes in current management. Continue monitoring BP at home. Low-salt diet also to continue. Follow-up in 4 months, before if needed.  Circadian rhythm sleep disturbance Better controlled with Ambien. No changes in current management. Good sleep hygiene as possible. Follow-up in 4 months.  Return in about 4 months (around 02/14/2020).    Karlen Barbar G. Martinique, MD  [facility]. [place] office.

## 2019-10-16 NOTE — Assessment & Plan Note (Signed)
Better controlled with Ambien. No changes in current management. Good sleep hygiene as possible. Follow-up in 4 months.

## 2019-10-16 NOTE — Patient Instructions (Addendum)
DUE TO COVID-19 ONLY ONE VISITOR IS ALLOWED IN WAITING ROOM (VISITOR WILL HAVE A TEMPERATURE CHECK ON ARRIVAL AND MUST WEAR A FACE MASK THE ENTIRE TIME.)  ONCE YOU ARE ADMITTED TO YOUR PRIVATE ROOM, THE SAME ONE VISITOR IS ALLOWED TO VISIT DURING VISITING HOURS ONLY.  Your COVID swab testing is scheduled for 10/17/2019 after blood work. You must self quarantine after your testing per handout given to you at the testing site.  ([facility] testing enter pre-surgical testing line)    Your procedure is scheduled on:10/21/2019  Report to [facility] AT 5:30 A. M.   Call this number if you have problems the morning of surgery:[phone].   OUR ADDRESS IS [place].  WE ARE LOCATED IN [facility].                                     REMEMBER:   DO NOT EAT FOOD OR DRINK LIQUIDS AFTER MIDNIGHT .    BRUSH YOUR TEETH THE MORNING OF SURGERY.  TAKE THESE MEDICATIONS MORNING OF SURGERY WITH A SIP OF WATER:  Amlodipine  DO NOT WEAR JEWERLY, MAKE UP, OR NAIL POLISH.  DO NOT WEAR LOTIONS, POWDERS, PERFUMES/COLOGNE OR DEODORANT.  DO NOT SHAVE FOR 24 HOURS PRIOR TO DAY OF SURGERY.  .  CONTACTS, GLASSES, OR DENTURES MAY NOT BE WORN TO SURGERY.                                    [facility] IS NOT RESPONSIBLE  FOR ANY BELONGINGS.          BRING ALL PRESCRIPTION MEDICATIONS WITH YOU THE DAY OF SURGERY IN ORIGINAL CONTAINERS                                                               YOU MAY BRING A SMALL OVERNIGHT [facility] - Preparing for Surgery Before surgery, you can play an important role.  Because skin is not sterile, your skin needs to be as free of germs as possible.  You can reduce the number of germs on your skin by washing with CHG (chlorahexidine gluconate) soap before surgery.  CHG is an antiseptic cleaner which kills germs and bonds with the skin to continue killing germs  even after washing. Please DO NOT use if you have an allergy to CHG or antibacterial soaps.  If your skin becomes reddened/irritated stop using the CHG and inform your nurse when you arrive at Short Stay. Do not shave (including legs and underarms) for at least 48 hours prior to the first CHG shower.  You may shave your face/neck. Please follow these instructions carefully:  1.  Shower with CHG Soap the night before surgery and the  morning of Surgery.  2.  If you choose to wash your hair, wash your hair first as usual with your  normal  shampoo.  3.  After you shampoo, rinse your hair and body thoroughly to remove the  shampoo.  4.  Use CHG as you would any other liquid soap.  You can apply chg directly  to the skin and wash                       Gently with a scrungie or clean washcloth.  5.  Apply the CHG Soap to your body ONLY FROM THE NECK DOWN.   Do not use on face/ open                           Wound or open sores. Avoid contact with eyes, ears mouth and genitals (private parts).                       Wash face,  Genitals (private parts) with your normal soap.             6.  Wash thoroughly, paying special attention to the area where your surgery  will be performed.  7.  Thoroughly rinse your body with warm water from the neck down.  8.  DO NOT shower/wash with your normal soap after using and rinsing off  the CHG Soap.                9.  Pat yourself dry with a clean towel.            10.  Wear clean pajamas.            11.  Place clean sheets on your bed the night of your first shower and do not  sleep with pets. Day of Surgery : Do not apply any lotions/deodorants the morning of surgery.  Please wear clean clothes to the hospital/surgery center.  FAILURE TO FOLLOW THESE INSTRUCTIONS MAY RESULT IN THE CANCELLATION OF YOUR SURGERY PATIENT SIGNATURE_________________________________  NURSE  SIGNATURE__________________________________  ________________________________________________________________________

## 2019-10-16 NOTE — Patient Instructions (Signed)
A few things to remember from today's visit:   Hypertension, essential, benign  No changes today.  Please be sure medication list is accurate. If a new problem present, please set up appointment sooner than planned today.

## 2019-10-17 ENCOUNTER — Other Ambulatory Visit (HOSPITAL_COMMUNITY)
Admission: RE | Admit: 2019-10-17 | Discharge: 2019-10-17 | Disposition: A | Discharge status: Home / Self Care | Payer: 59 | Source: Ambulatory Visit | Attending: Obstetrics and Gynecology | Admitting: Obstetrics and Gynecology

## 2019-10-17 ENCOUNTER — Other Ambulatory Visit: Payer: Self-pay

## 2019-10-17 ENCOUNTER — Encounter (HOSPITAL_COMMUNITY)
Admission: RE | Admit: 2019-10-17 | Discharge: 2019-10-17 | Disposition: A | Discharge status: Home / Self Care | Payer: 59 | Source: Ambulatory Visit | Attending: Obstetrics and Gynecology | Admitting: Obstetrics and Gynecology

## 2019-10-17 ENCOUNTER — Encounter (HOSPITAL_COMMUNITY): Payer: Self-pay

## 2019-10-17 DIAGNOSIS — N939 Abnormal uterine and vaginal bleeding, unspecified: Secondary | ICD-10-CM | POA: Diagnosis not present

## 2019-10-17 DIAGNOSIS — Z01812 Encounter for preprocedural laboratory examination: Secondary | ICD-10-CM | POA: Insufficient documentation

## 2019-10-17 DIAGNOSIS — N83201 Unspecified ovarian cyst, right side: Secondary | ICD-10-CM | POA: Diagnosis not present

## 2019-10-17 DIAGNOSIS — Z20828 Contact with and (suspected) exposure to other viral communicable diseases: Secondary | ICD-10-CM | POA: Diagnosis not present

## 2019-10-17 HISTORY — DX: Essential (primary) hypertension: I10

## 2019-10-17 LAB — BASIC METABOLIC PANEL
Anion gap: 8 (ref 5–15)
BUN: 15 mg/dL (ref 6–20)
CO2: 23 mmol/L (ref 22–32)
Calcium: 9.3 mg/dL (ref 8.9–10.3)
Chloride: 108 mmol/L (ref 98–111)
Creatinine, Ser: 0.92 mg/dL (ref 0.44–1.00)
GFR calc Af Amer: >60 mL/min (ref >60)
GFR calc non Af Amer: >60 mL/min (ref >60)
Glucose, Bld: 108 mg/dL — ABNORMAL HIGH (ref 70–99)
Potassium: 4.1 mmol/L (ref 3.5–5.1)
Sodium: 139 mmol/L (ref 135–145)

## 2019-10-17 LAB — CBC
HCT: 39.5 % (ref 36.0–46.0)
Hemoglobin: 12.7 g/dL (ref 12.0–15.0)
MCH: 30.8 pg (ref 26.0–34.0)
MCHC: 32.2 g/dL (ref 30.0–36.0)
MCV: 95.9 fL (ref 80.0–100.0)
Platelets: 312 10*3/uL (ref 150–400)
RBC: 4.12 MIL/uL (ref 3.87–5.11)
RDW: 11.9 % (ref 11.5–15.5)
WBC: 8.5 10*3/uL (ref 4.0–10.5)
nRBC: 0 % (ref 0.0–0.2)

## 2019-10-17 LAB — ABO/RH: ABO/RH(D): B POS

## 2019-10-17 NOTE — Progress Notes (Signed)
PCP - Betty Martinique. LDV: 10/16/2019 Cardiologist -   Chest x-ray -  EKG -  Stress Test -  ECHO -  Cardiac Cath -   Sleep Study -  CPAP -   Fasting Blood Sugar -  Checks Blood Sugar _____ times a day  Blood Thinner Instructions: Aspirin Instructions: Last Dose:  Anesthesia review:   Patient denies shortness of breath, fever, cough and chest pain at PAT appointment   Patient verbalized understanding of instructions that were given to them at the PAT appointment. Patient was also instructed that they will need to review over the PAT instructions again at home before surgery.

## 2019-10-18 LAB — NOVEL CORONAVIRUS, NAA (HOSP ORDER, SEND-OUT TO REF LAB; TAT 18-24 HRS): SARS-CoV-2, NAA: NOT DETECTED

## 2019-10-20 NOTE — Anesthesia Preprocedure Evaluation (Addendum)
Anesthesia Evaluation  Patient identified by MRN, date of birth, ID band Patient awake    Reviewed: Allergy & Precautions, NPO status , Patient's Chart, lab work & pertinent test results  Airway Mallampati: II  TM Distance: >3 FB Neck ROM: Full    Dental no notable dental hx. (+) Teeth Intact, Dental Advisory Given   Pulmonary neg pulmonary ROS,    Pulmonary exam normal breath sounds clear to auscultation       Cardiovascular Exercise Tolerance: Good hypertension, Pt. on medications Normal cardiovascular exam Rhythm:Regular Rate:Normal  09/21/19  EKG SB R 54 w NSSTChanges   Neuro/Psych Depression negative neurological ROS     GI/Hepatic Neg liver ROS, GERD  ,  Endo/Other  negative endocrine ROS  Renal/GU K+ 4.1     Musculoskeletal negative musculoskeletal ROS (+)   Abdominal (+) - obese,   Peds  Hematology hgb  12.7 T x S available   Anesthesia Other Findings   Reproductive/Obstetrics negative OB ROS                           Anesthesia Physical Anesthesia Plan  ASA: II  Anesthesia Plan: General   Post-op Pain Management:    Induction: Intravenous  PONV Risk Score and Plan: 4 or greater and Midazolam, Ondansetron, Dexamethasone and Treatment may vary due to age or medical condition  Airway Management Planned: Oral ETT  Additional Equipment:   Intra-op Plan:   Post-operative Plan: Extubation in OR  Informed Consent: I have reviewed the patients History and Physical, chart, labs and discussed the procedure including the risks, benefits and alternatives for the proposed anesthesia with the patient or authorized representative who has indicated his/her understanding and acceptance.     Dental advisory given  Plan Discussed with: CRNA  Anesthesia Plan Comments: (GA w lidocaine infusion)       Anesthesia Quick Evaluation

## 2019-10-20 NOTE — H&P (Signed)
Office Visit  10/01/2019 Courtney Donaldson, Courtney All, MD Obstetrics and Gynecology  Abnormal uterine bleeding +1 more Dx  Surgery consult ; Referred by Martinique, Betty G, MD Reason for Visit  Additional Documentation  Vitals:    BP 124/80 (Cuff Size: Large)  Pulse 76  Temp 97.3 F (36.3 C)  (Temporal)  Ht 5' 3.25" (1.607 m)  Wt 80.7 kg  LMP 09/05/2019   BMI 31.28 kg/m  BSA 1.9 m  Flowsheets:    NEWS,  MEWS Score,  Anthropometrics,  Method of Visit    Encounter Info:   Billing Info,  History,  Allergies,  Detailed Report    Orthostatic Vitals Recorded in This Encounter   10/01/2019  1601     Cuff Size: Large  Donaldson Notes   Progress Notes by Nunzio Cobbs, MD at 10/01/2019 4:00 PM Author: Nunzio Cobbs, MD Author Type: Physician Filed: 10/04/2019  5:39 PM  Note Status: Signed Cosign: Cosign Not Required Encounter Date: 10/01/2019  Editor: Nunzio Cobbs, MD (Physician)  Prior Versions: 1. Lowella Fairy, CMA (Certified Psychologist, sport and exercise) at 10/01/2019  4:04 PM - Sign when Signing Visit    GYNECOLOGY  VISIT   HPI: 43 y.o.   Single  African American  female   [place] with Patient's last menstrual period was 09/05/2019.here for surgical consult.  She desires hysterectomy for abnormal uterine bleeding.  She declines medical therapy. She declines future childbearing.    Long hx of abnormal uterine bleeding, treated with Mirena IUD,  Aygestin, and most recently Seasonale. States her bleeding is lighter but still bleeding almost daily.  No missed birth control pills.   Denies pain.    Pelvic US on 03/21/19 has shown a normal uterus with EMS 3.67 mm and right ovarian cyst, 24 x 28 mm, probable dermoid.  Her left ovary is normal.  Benign EMB in 2018.    She reports a new dx of HTN and sciatica of her left leg.  States she recently had an injection for the sciatica pain.    She works at  [employer] and she does some lifting at work, up to 50 pounds.  She also needs to deal with pallets.    She has Covid testing on 10/17/19.   GYNECOLOGIC HISTORY: Patient's last menstrual period was 09/05/2019. Contraception:  Seasonale. Menopausal hormone therapy:  none Last mammogram: 10/17/17 BIRADS 1 negative/density b--appt. 10-09-19 Last pap smear:  03/20/19 Neg:Neg HR HPV, 02-22-16 Neg:Neg HR HPV, 03-08-13 Neg:Neg HR HPV                OB History     Gravida  0   Para  0   Term  0   Preterm  0   AB  0   Living  0      SAB  0   TAB  0   Ectopic  0   Multiple  0   Live Births  0                     Patient Active Problem List    Diagnosis Date Noted  . Hypertension, essential, benign 09/20/2019  . Cough, persistent 02/26/2019  . Allergic rhinitis 02/26/2019  . Ovarian mass, right 04/28/2018  . GERD (gastroesophageal reflux disease) 04/02/2018  . Circadian rhythm sleep disturbance 04/02/2018  . Hyperlipidemia 04/02/2018  . IUD (intrauterine device) in place 05/17/2013  .  Class 2 obesity with body mass index (BMI) of 35.0 to 35.9 in adult 03/08/2013          Past Medical History:  Diagnosis Date  . Bronchitis 10/2013  . Depression    . Low vitamin D level    . Right ovarian cyst 2015    Calcified cyst - possible dermoid.  Needs yearly ultrasound.           Past Surgical History:  Procedure Laterality Date  . INTRAUTERINE DEVICE (IUD) INSERTION   04/11/13    Mirena            Current Outpatient Medications  Medication Sig Dispense Refill  . amLODipine-benazepril (LOTREL) 5-10 MG capsule Take 1 capsule by mouth daily. 30 capsule 1  . levonorgestrel-ethinyl estradiol (SEASONALE) 0.15-0.03 MG tablet Take 1 tablet by mouth daily. 1 Package 0  . zolpidem (AMBIEN) 5 MG tablet Take 0.5-1 tablets (2.5-5 mg total) by mouth at bedtime as needed for sleep. 15 tablet 1    No current facility-administered medications for this visit.        ALLERGIES: Patient has no known allergies.        Family History  Problem Relation Age of Onset  . Diabetes Mother    . Pancreatic cancer Paternal Aunt 50  . Breast cancer Paternal Aunt    . Diabetes Maternal Grandmother    . Diabetes Maternal Grandfather    . Cancer Maternal Grandfather    . Obesity Maternal Grandfather    . Breast cancer Paternal Grandmother 75        postmenopausal  . Cancer Paternal Grandmother    . Hypertension Father    . Early death Father    . Hyperlipidemia Father    . Kidney disease Father    . Depression Sister    . Hypertension Sister    . Heart attack Paternal Grandfather    . Kidney failure Other 35      Social History         Socioeconomic History  . Marital status: Single      Spouse name: Not on file  . Number of children: 0  . Years of education: Not on file  . Highest education level: Not on file  Occupational History  . Not on file  Social Needs  . Financial resource strain: Not on file  . Food insecurity      Worry: Not on file      Inability: Not on file  . Transportation needs      Medical: Not on file      Non-medical: Not on file  Tobacco Use  . Smoking status: Never Smoker  . Smokeless tobacco: Never Used  Substance and Sexual Activity  . Alcohol use: Yes      Alcohol/week: 1.0 - 2.0 standard drinks      Types: 1 - 2 Standard drinks or equivalent per week  . Drug use: No  . Sexual activity: Yes      Partners: Male  Lifestyle  . Physical activity      Days per week: Not on file      Minutes per session: Not on file  . Stress: Not on file  Relationships  . Social Chief Strategy Officer on phone: Not on file      Gets together: Not on file      Attends religious service: Not on file      Active member of club or organization:  Not on file      Attends meetings of clubs or organizations: Not on file      Relationship status: Not on file  . Intimate partner violence      Fear of current or ex partner: Not on  file      Emotionally abused: Not on file      Physically abused: Not on file      Forced sexual activity: Not on file  Other Topics Concern  . Not on file  Social History Narrative  . Not on file      Review of Systems  Donaldson other systems reviewed and are negative.     PHYSICAL EXAMINATION:     BP 124/80 (Cuff Size: Large)   Pulse 76   Temp (!) 97.3 F (36.3 C) (Temporal)   Ht 5' 3.25" (1.607 m)   Wt 178 lb (80.7 kg)   LMP 09/05/2019 Comment: DUB on OCP  BMI 31.28 kg/m     General appearance: alert, cooperative and appears stated age Head: Normocephalic, without obvious abnormality, atraumatic Neck: no adenopathy, supple, symmetrical, trachea midline and thyroid normal to inspection and palpation Lungs: clear to auscultation bilaterally Heart: regular rate and rhythm Abdomen: soft, non-tender, no masses,  no organomegaly Extremities: extremities normal, atraumatic, no cyanosis or edema Skin: Skin color, texture, turgor normal. No rashes or lesions Lymph nodes: Cervical, supraclavicular, and axillary nodes normal. No abnormal inguinal nodes palpated Neurologic: Grossly normal   Pelvic: External genitalia:  no lesions              Urethra:  normal appearing urethra with no masses, tenderness or lesions              Bartholins and Skenes: normal                 Vagina: normal appearing vagina with normal color and discharge, no lesions              Cervix: no lesions                Bimanual Exam:  Uterus:  normal size, contour, position, consistency, mobility, non-tender              Adnexa: no mass, fullness, tenderness                Chaperone was present for exam.   ASSESSMENT   Abnormal uterine bleeding.  Currently on Seasonale. Right ovarian cyst.  Possible dermoid. Recent sciatica of left leg.  New dx of HTN.  May be partially related to her Seasonale.   PLAN   I discussed total laparoscopic hysterectomy with bilateral salpingectomy, right oophorectomy,  possible bilateral oophorectomy, and cystoscopy.     I reviewed risks, benefits, and alternatives.  Risks include but are not limited to bleeding, infection, damage to surrounding organs, pneumonia, reaction to anesthesia, DVT, PE, death, need for reoperation, hernia formation, vaginal cuff dehiscence, need to convert to a traditional laparotomy incision to complete the procedure, and menopausal symptoms if bilateral ovaries are removed.       Surgical expectations and recovery discussed.  Off from work for 8 weeks.  Patient wishes to proceed.   She will do a magnesium citrate bowel prep.  She will receive Lovenox for DVT/PE prophylaxis.   Complete mammogram preop.   She will continue her Seasonale until hysterectomy is performed.   Questions invited and answered.   An After Visit Summary was printed and given to the patient.

## 2019-10-21 ENCOUNTER — Encounter (HOSPITAL_BASED_OUTPATIENT_CLINIC_OR_DEPARTMENT_OTHER): Payer: Self-pay | Admitting: Obstetrics and Gynecology

## 2019-10-21 ENCOUNTER — Ambulatory Visit (HOSPITAL_BASED_OUTPATIENT_CLINIC_OR_DEPARTMENT_OTHER)
Admission: RE | Admit: 2019-10-21 | Discharge: 2019-10-21 | Disposition: A | Discharge status: Home / Self Care | Payer: 59 | Source: Ambulatory Visit | Attending: Obstetrics and Gynecology | Admitting: Obstetrics and Gynecology

## 2019-10-21 ENCOUNTER — Observation Stay (HOSPITAL_BASED_OUTPATIENT_CLINIC_OR_DEPARTMENT_OTHER): Payer: 59 | Admitting: Physician Assistant

## 2019-10-21 ENCOUNTER — Observation Stay (HOSPITAL_BASED_OUTPATIENT_CLINIC_OR_DEPARTMENT_OTHER): Payer: 59 | Admitting: Anesthesiology

## 2019-10-21 ENCOUNTER — Encounter (HOSPITAL_BASED_OUTPATIENT_CLINIC_OR_DEPARTMENT_OTHER)
Admission: RE | Disposition: A | Discharge status: Home / Self Care | Payer: Self-pay | Source: Ambulatory Visit | Attending: Obstetrics and Gynecology

## 2019-10-21 ENCOUNTER — Other Ambulatory Visit: Payer: Self-pay

## 2019-10-21 DIAGNOSIS — D270 Benign neoplasm of right ovary: Secondary | ICD-10-CM | POA: Diagnosis not present

## 2019-10-21 DIAGNOSIS — Z793 Long term (current) use of hormonal contraceptives: Secondary | ICD-10-CM | POA: Insufficient documentation

## 2019-10-21 DIAGNOSIS — Z6830 Body mass index (BMI) 30.0-30.9, adult: Secondary | ICD-10-CM | POA: Diagnosis not present

## 2019-10-21 DIAGNOSIS — N736 Female pelvic peritoneal adhesions (postinfective): Secondary | ICD-10-CM | POA: Insufficient documentation

## 2019-10-21 DIAGNOSIS — D252 Subserosal leiomyoma of uterus: Secondary | ICD-10-CM | POA: Insufficient documentation

## 2019-10-21 DIAGNOSIS — Z90710 Acquired absence of both cervix and uterus: Secondary | ICD-10-CM | POA: Diagnosis present

## 2019-10-21 DIAGNOSIS — I10 Essential (primary) hypertension: Secondary | ICD-10-CM | POA: Diagnosis not present

## 2019-10-21 DIAGNOSIS — N938 Other specified abnormal uterine and vaginal bleeding: Secondary | ICD-10-CM | POA: Insufficient documentation

## 2019-10-21 DIAGNOSIS — E669 Obesity, unspecified: Secondary | ICD-10-CM | POA: Diagnosis not present

## 2019-10-21 DIAGNOSIS — N83291 Other ovarian cyst, right side: Secondary | ICD-10-CM

## 2019-10-21 HISTORY — PX: CYSTOSCOPY: SHX5120

## 2019-10-21 HISTORY — PX: LAPAROSCOPIC LYSIS OF ADHESIONS: SHX5905

## 2019-10-21 HISTORY — PX: TOTAL LAPAROSCOPIC HYSTERECTOMY WITH SALPINGECTOMY: SHX6742

## 2019-10-21 LAB — CBC
HCT: 42.7 % (ref 36.0–46.0)
Hemoglobin: 13.5 g/dL (ref 12.0–15.0)
MCH: 30.5 pg (ref 26.0–34.0)
MCHC: 31.6 g/dL (ref 30.0–36.0)
MCV: 96.6 fL (ref 80.0–100.0)
Platelets: 343 10*3/uL (ref 150–400)
RBC: 4.42 MIL/uL (ref 3.87–5.11)
RDW: 11.8 % (ref 11.5–15.5)
WBC: 9.4 10*3/uL (ref 4.0–10.5)
nRBC: 0 % (ref 0.0–0.2)

## 2019-10-21 LAB — TYPE AND SCREEN
ABO/RH(D): B POS
Antibody Screen: NEGATIVE

## 2019-10-21 LAB — POCT PREGNANCY, URINE: Preg Test, Ur: NEGATIVE

## 2019-10-21 SURGERY — HYSTERECTOMY, TOTAL, LAPAROSCOPIC, WITH SALPINGECTOMY
Anesthesia: General | Site: Bladder | Laterality: Right

## 2019-10-21 MED ORDER — ROCURONIUM BROMIDE 50 MG/5ML IV SOSY
PREFILLED_SYRINGE | INTRAVENOUS | Status: DC | PRN
  Administered 2019-10-21 (×2): 10 mg via INTRAVENOUS
  Administered 2019-10-21: 50 mg via INTRAVENOUS

## 2019-10-21 MED ORDER — MIDAZOLAM HCL 5 MG/5ML IJ SOLN
INTRAMUSCULAR | Status: DC | PRN
  Administered 2019-10-21: 2 mg via INTRAVENOUS

## 2019-10-21 MED ORDER — LACTATED RINGERS IV SOLN
INTRAVENOUS | Status: DC
  Filled 2019-10-21 (×2): qty 1000

## 2019-10-21 MED ORDER — OXYCODONE-ACETAMINOPHEN 5-325 MG PO TABS: 1.0000 | ORAL_TABLET | ORAL | 0 refills | Status: DC | PRN

## 2019-10-21 MED ORDER — ONDANSETRON HCL 4 MG/2ML IJ SOLN
INTRAMUSCULAR | Status: AC
  Filled 2019-10-21: qty 2

## 2019-10-21 MED ORDER — FENTANYL CITRATE (PF) 100 MCG/2ML IJ SOLN
25.0000 ug | INTRAMUSCULAR | Status: DC | PRN
  Administered 2019-10-21 (×2): 50 ug via INTRAVENOUS
  Filled 2019-10-21: qty 1

## 2019-10-21 MED ORDER — WHITE PETROLATUM EX OINT
TOPICAL_OINTMENT | CUTANEOUS | Status: AC
  Filled 2019-10-21: qty 5

## 2019-10-21 MED ORDER — ONDANSETRON HCL 4 MG PO TABS
4.0000 mg | ORAL_TABLET | Freq: Four times a day (QID) | ORAL | Status: DC | PRN
  Filled 2019-10-21: qty 1

## 2019-10-21 MED ORDER — PROPOFOL 10 MG/ML IV BOLUS
INTRAVENOUS | Status: AC
  Filled 2019-10-21: qty 40

## 2019-10-21 MED ORDER — HYDROMORPHONE HCL 1 MG/ML IJ SOLN
0.2500 mg | INTRAMUSCULAR | Status: DC | PRN
  Administered 2019-10-21 (×4): 0.5 mg via INTRAVENOUS
  Filled 2019-10-21: qty 0.5

## 2019-10-21 MED ORDER — SODIUM CHLORIDE 0.9 % IR SOLN
Status: DC | PRN
  Administered 2019-10-21: 1000 mL via INTRAVESICAL

## 2019-10-21 MED ORDER — ONDANSETRON HCL 4 MG/2ML IJ SOLN
4.0000 mg | Freq: Once | INTRAMUSCULAR | Status: DC | PRN
  Filled 2019-10-21: qty 2

## 2019-10-21 MED ORDER — ACETAMINOPHEN 500 MG PO TABS
1000.0000 mg | ORAL_TABLET | Freq: Once | ORAL | Status: AC
  Administered 2019-10-21: 1000 mg via ORAL
  Filled 2019-10-21: qty 2

## 2019-10-21 MED ORDER — DEXAMETHASONE SODIUM PHOSPHATE 10 MG/ML IJ SOLN
INTRAMUSCULAR | Status: AC
  Filled 2019-10-21: qty 1

## 2019-10-21 MED ORDER — FENTANYL CITRATE (PF) 250 MCG/5ML IJ SOLN
INTRAMUSCULAR | Status: AC
  Filled 2019-10-21: qty 5

## 2019-10-21 MED ORDER — DEXAMETHASONE SODIUM PHOSPHATE 10 MG/ML IJ SOLN
INTRAMUSCULAR | Status: DC | PRN
  Administered 2019-10-21: 10 mg via INTRAVENOUS

## 2019-10-21 MED ORDER — ENOXAPARIN SODIUM 40 MG/0.4ML ~~LOC~~ SOLN
40.0000 mg | SUBCUTANEOUS | Status: AC
  Administered 2019-10-21: 40 mg via SUBCUTANEOUS
  Filled 2019-10-21: qty 0.4

## 2019-10-21 MED ORDER — ENOXAPARIN SODIUM 40 MG/0.4ML ~~LOC~~ SOLN
SUBCUTANEOUS | Status: AC
  Filled 2019-10-21: qty 0.4

## 2019-10-21 MED ORDER — FENTANYL CITRATE (PF) 100 MCG/2ML IJ SOLN
INTRAMUSCULAR | Status: AC
  Filled 2019-10-21: qty 2

## 2019-10-21 MED ORDER — SUGAMMADEX SODIUM 500 MG/5ML IV SOLN
INTRAVENOUS | Status: AC
  Filled 2019-10-21: qty 5

## 2019-10-21 MED ORDER — ONDANSETRON HCL 4 MG/2ML IJ SOLN
INTRAMUSCULAR | Status: DC | PRN
  Administered 2019-10-21: 4 mg via INTRAVENOUS

## 2019-10-21 MED ORDER — SODIUM CHLORIDE 0.9 % IV SOLN
INTRAVENOUS | Status: DC | PRN
  Administered 2019-10-21: 50 mL

## 2019-10-21 MED ORDER — SUGAMMADEX SODIUM 200 MG/2ML IV SOLN
INTRAVENOUS | Status: DC | PRN
  Administered 2019-10-21: 158.6 mg via INTRAVENOUS

## 2019-10-21 MED ORDER — IBUPROFEN 800 MG PO TABS: 800.0000 mg | ORAL_TABLET | Freq: Three times a day (TID) | ORAL | 0 refills | Status: DC | PRN

## 2019-10-21 MED ORDER — ROCURONIUM BROMIDE 10 MG/ML (PF) SYRINGE
PREFILLED_SYRINGE | INTRAVENOUS | Status: AC
  Filled 2019-10-21: qty 10

## 2019-10-21 MED ORDER — HYDROMORPHONE HCL 1 MG/ML IJ SOLN
INTRAMUSCULAR | Status: AC
  Filled 2019-10-21: qty 1

## 2019-10-21 MED ORDER — MORPHINE SULFATE (PF) 2 MG/ML IV SOLN
1.0000 mg | INTRAVENOUS | Status: DC | PRN
  Filled 2019-10-21: qty 1

## 2019-10-21 MED ORDER — LIDOCAINE 2% (20 MG/ML) 5 ML SYRINGE
INTRAMUSCULAR | Status: AC
  Filled 2019-10-21: qty 5

## 2019-10-21 MED ORDER — KETOROLAC TROMETHAMINE 30 MG/ML IJ SOLN
INTRAMUSCULAR | Status: DC | PRN
  Administered 2019-10-21: 30 mg via INTRAVENOUS

## 2019-10-21 MED ORDER — FENTANYL CITRATE (PF) 100 MCG/2ML IJ SOLN
INTRAMUSCULAR | Status: DC | PRN
  Administered 2019-10-21 (×2): 100 ug via INTRAVENOUS

## 2019-10-21 MED ORDER — MENTHOL 3 MG MT LOZG
1.0000 | LOZENGE | OROMUCOSAL | Status: DC | PRN
  Filled 2019-10-21: qty 9

## 2019-10-21 MED ORDER — ONDANSETRON HCL 4 MG/2ML IJ SOLN
4.0000 mg | Freq: Four times a day (QID) | INTRAMUSCULAR | Status: DC | PRN
  Filled 2019-10-21: qty 2

## 2019-10-21 MED ORDER — SODIUM CHLORIDE 0.9 % IV SOLN
2.0000 g | INTRAVENOUS | Status: AC
  Administered 2019-10-21: 2 g via INTRAVENOUS
  Filled 2019-10-21: qty 2

## 2019-10-21 MED ORDER — PROPOFOL 10 MG/ML IV BOLUS
INTRAVENOUS | Status: DC | PRN
  Administered 2019-10-21: 170 mg via INTRAVENOUS
  Administered 2019-10-21: 30 mg via INTRAVENOUS

## 2019-10-21 MED ORDER — OXYCODONE-ACETAMINOPHEN 5-325 MG PO TABS
ORAL_TABLET | ORAL | Status: AC
  Filled 2019-10-21: qty 2

## 2019-10-21 MED ORDER — MEPERIDINE HCL 25 MG/ML IJ SOLN
6.2500 mg | INTRAMUSCULAR | Status: DC | PRN
  Filled 2019-10-21: qty 1

## 2019-10-21 MED ORDER — SODIUM CHLORIDE 0.9 % IR SOLN
Status: DC | PRN
  Administered 2019-10-21: 200 mL

## 2019-10-21 MED ORDER — LIDOCAINE 2% (20 MG/ML) 5 ML SYRINGE
INTRAMUSCULAR | Status: DC | PRN
  Administered 2019-10-21: 100 mg via INTRAVENOUS

## 2019-10-21 MED ORDER — LACTATED RINGERS IV SOLN
INTRAVENOUS | Status: DC
  Filled 2019-10-21: qty 1000

## 2019-10-21 MED ORDER — KETOROLAC TROMETHAMINE 30 MG/ML IJ SOLN
INTRAMUSCULAR | Status: AC
  Filled 2019-10-21: qty 1

## 2019-10-21 MED ORDER — ACETAMINOPHEN 500 MG PO TABS
ORAL_TABLET | ORAL | Status: AC
  Filled 2019-10-21: qty 2

## 2019-10-21 MED ORDER — DIPHENHYDRAMINE HCL 50 MG/ML IJ SOLN
INTRAMUSCULAR | Status: AC
  Filled 2019-10-21: qty 1

## 2019-10-21 MED ORDER — SODIUM CHLORIDE 0.9 % IV SOLN
INTRAVENOUS | Status: AC
  Filled 2019-10-21: qty 2

## 2019-10-21 MED ORDER — OXYCODONE-ACETAMINOPHEN 5-325 MG PO TABS
1.0000 | ORAL_TABLET | ORAL | Status: DC | PRN
  Administered 2019-10-21 (×2): 1 via ORAL
  Administered 2019-10-21: 2 via ORAL
  Filled 2019-10-21: qty 2

## 2019-10-21 MED ORDER — DIPHENHYDRAMINE HCL 50 MG/ML IJ SOLN
25.0000 mg | Freq: Once | INTRAMUSCULAR | Status: AC
  Administered 2019-10-21: 25 mg via INTRAVENOUS
  Filled 2019-10-21: qty 0.5

## 2019-10-21 MED ORDER — HYDROCODONE-ACETAMINOPHEN 7.5-325 MG PO TABS
1.0000 | ORAL_TABLET | Freq: Once | ORAL | Status: DC | PRN
  Filled 2019-10-21: qty 1

## 2019-10-21 MED ORDER — OXYCODONE-ACETAMINOPHEN 5-325 MG PO TABS
ORAL_TABLET | ORAL | Status: AC
  Filled 2019-10-21: qty 1

## 2019-10-21 MED ORDER — ACETAMINOPHEN 10 MG/ML IV SOLN
1000.0000 mg | Freq: Once | INTRAVENOUS | Status: DC | PRN
  Filled 2019-10-21: qty 100

## 2019-10-21 MED ORDER — MIDAZOLAM HCL 5 MG/5ML IJ SOLN: INTRAMUSCULAR | Status: DC | PRN

## 2019-10-21 MED ORDER — BUPIVACAINE HCL (PF) 0.25 % IJ SOLN
INTRAMUSCULAR | Status: DC | PRN
  Administered 2019-10-21: 15 mL

## 2019-10-21 MED ORDER — MIDAZOLAM HCL 2 MG/2ML IJ SOLN
INTRAMUSCULAR | Status: AC
  Filled 2019-10-21: qty 2

## 2019-10-21 SURGICAL SUPPLY — 76 items
ADH SKN CLS APL DERMABOND .7 (GAUZE/BANDAGES/DRESSINGS) ×4
APL SRG 38 LTWT LNG FL B (MISCELLANEOUS) ×4
APPLICATOR ARISTA FLEXITIP XL (MISCELLANEOUS) ×2 IMPLANT
BAG RETRIEVAL 10 (BASKET)
BARRIER ADHS 3X4 INTERCEED (GAUZE/BANDAGES/DRESSINGS) IMPLANT
BRR ADH 4X3 ABS CNTRL BYND (GAUZE/BANDAGES/DRESSINGS)
CABLE HIGH FREQUENCY MONO STRZ (ELECTRODE) IMPLANT
CANISTER SUCT 3000ML PPV (MISCELLANEOUS) ×5 IMPLANT
CELL SAVER LIPIGURD (MISCELLANEOUS) IMPLANT
COVER MAYO STAND STRL (DRAPES) ×5 IMPLANT
COVER TABLE BACK 60X90 (DRAPES) IMPLANT
COVER WAND RF STERILE (DRAPES) ×5 IMPLANT
DECANTER SPIKE VIAL GLASS SM (MISCELLANEOUS) ×6 IMPLANT
DERMABOND ADVANCED (GAUZE/BANDAGES/DRESSINGS) ×1
DERMABOND ADVANCED .7 DNX12 (GAUZE/BANDAGES/DRESSINGS) ×4 IMPLANT
DEVICE RETRIEVAL ALEXIS 14 (MISCELLANEOUS) IMPLANT
DEVICE TROCAR PUNCTURE CLOSURE (ENDOMECHANICALS) ×1 IMPLANT
DRSG COVADERM PLUS 2X2 (GAUZE/BANDAGES/DRESSINGS) IMPLANT
DRSG OPSITE POSTOP 3X4 (GAUZE/BANDAGES/DRESSINGS) IMPLANT
DURAPREP 26ML APPLICATOR (WOUND CARE) ×5 IMPLANT
EXTRT SYSTEM ALEXIS 14CM (MISCELLANEOUS)
EXTRT SYSTEM ALEXIS 17CM (MISCELLANEOUS)
GAUZE 4X4 16PLY RFD (DISPOSABLE) ×5 IMPLANT
GLOVE BIO SURGEON STRL SZ 6.5 (GLOVE) ×10 IMPLANT
GLOVE BIOGEL PI IND STRL 7.0 (GLOVE) ×12 IMPLANT
GLOVE BIOGEL PI INDICATOR 7.0 (GLOVE) ×3
GOWN STRL REUS W/TWL LRG LVL3 (GOWN DISPOSABLE) ×20 IMPLANT
GOWN STRL REUS W/TWL XL LVL3 (GOWN DISPOSABLE) ×5 IMPLANT
HEMOSTAT ARISTA ABSORB 3G PWDR (HEMOSTASIS) ×2 IMPLANT
HOLDER FOLEY CATH W/STRAP (MISCELLANEOUS) ×2 IMPLANT
LIGASURE VESSEL 5MM BLUNT TIP (ELECTROSURGICAL) ×5 IMPLANT
NEEDLE INSUFFLATION 120MM (ENDOMECHANICALS) ×5 IMPLANT
NS IRRIG 1000ML POUR BTL (IV SOLUTION) ×5 IMPLANT
OCCLUDER COLPOPNEUMO (BALLOONS) ×5 IMPLANT
PACK LAPAROSCOPY BASIN (CUSTOM PROCEDURE TRAY) ×5 IMPLANT
PACK TRENDGUARD 450 HYBRID PRO (MISCELLANEOUS) ×1 IMPLANT
POUCH LAPAROSCOPIC INSTRUMENT (MISCELLANEOUS) ×5 IMPLANT
PROTECTOR NERVE ULNAR (MISCELLANEOUS) ×8 IMPLANT
RETRACTOR WOUND ALXS 19CM XSML (INSTRUMENTS) IMPLANT
RTRCTR WOUND ALEXIS 19CM XSML (INSTRUMENTS)
SCISSORS LAP 5X35 DISP (ENDOMECHANICALS) IMPLANT
SET IRRIG TUBING LAPAROSCOPIC (IRRIGATION / IRRIGATOR) ×5 IMPLANT
SET IRRIG Y TYPE TUR BLADDER L (SET/KITS/TRAYS/PACK) ×5 IMPLANT
SET TRI-LUMEN FLTR TB AIRSEAL (TUBING) ×5 IMPLANT
SET TUBE SMOKE EVAC HIGH FLOW (TUBING) ×5 IMPLANT
SHEARS HARMONIC ACE PLUS 36CM (ENDOMECHANICALS) ×5 IMPLANT
SLEEVE ADV FIXATION 5X100MM (TROCAR) ×5 IMPLANT
SLEEVE SURGEON STRL (DRAPES) ×3 IMPLANT
SUT PLAIN 3 0 FS 2 27 (SUTURE) IMPLANT
SUT VIC AB 0 CT1 27 (SUTURE) ×10
SUT VIC AB 0 CT1 27XBRD ANBCTR (SUTURE) ×8 IMPLANT
SUT VIC AB 4-0 SH 27 (SUTURE) ×5
SUT VIC AB 4-0 SH 27XANBCTRL (SUTURE) ×4 IMPLANT
SUT VICRYL 0 UR6 27IN ABS (SUTURE) IMPLANT
SUT VICRYL 4-0 PS2 18IN ABS (SUTURE) ×5 IMPLANT
SUT VLOC 180 0 9IN  GS21 (SUTURE) ×1
SUT VLOC 180 0 9IN GS21 (SUTURE) ×1 IMPLANT
SYR 10ML LL (SYRINGE) ×5 IMPLANT
SYR 50ML LL SCALE MARK (SYRINGE) ×10 IMPLANT
SYS BAG RETRIEVAL 10MM (BASKET)
SYSTEM BAG RETRIEVAL 10MM (BASKET) IMPLANT
SYSTEM CARTER THOMASON II (TROCAR) IMPLANT
SYSTEM CONTND EXTRCTN KII BLLN (MISCELLANEOUS) IMPLANT
TIP RUMI ORANGE 6.7MMX12CM (TIP) IMPLANT
TIP UTERINE 5.1X6CM LAV DISP (MISCELLANEOUS) IMPLANT
TIP UTERINE 6.7X10CM GRN DISP (MISCELLANEOUS) IMPLANT
TIP UTERINE 6.7X6CM WHT DISP (MISCELLANEOUS) IMPLANT
TIP UTERINE 6.7X8CM BLUE DISP (MISCELLANEOUS) ×3 IMPLANT
TOWEL OR 17X26 10 PK STRL BLUE (TOWEL DISPOSABLE) ×9 IMPLANT
TRAY FOLEY W/BAG SLVR 14FR (SET/KITS/TRAYS/PACK) ×5 IMPLANT
TRENDGUARD 450 HYBRID PRO PACK (MISCELLANEOUS) ×5
TROCAR ADV FIXATION 5X100MM (TROCAR) ×5 IMPLANT
TROCAR PORT AIRSEAL 8X120 (TROCAR) ×5 IMPLANT
TROCAR XCEL NON-BLD 11X100MML (ENDOMECHANICALS) IMPLANT
TROCAR XCEL NON-BLD 5MMX100MML (ENDOMECHANICALS) ×5 IMPLANT
WARMER LAPAROSCOPE (MISCELLANEOUS) ×5 IMPLANT

## 2019-10-21 NOTE — Progress Notes (Signed)
Patient c/o itching upon arrival from PACU that remains persistent. No visible rash noted, [name] notified, new orders obtained for IV benadryl x 1 now. Will continue to monitor.

## 2019-10-21 NOTE — Progress Notes (Signed)
Update to History and Physical  No marked change in status since office pre-op visit.   Patient examined.   OK to proceed with surgery. 

## 2019-10-21 NOTE — Anesthesia Postprocedure Evaluation (Signed)
Anesthesia Post Note  Patient: [id number] Courtney Donaldson  Procedure(s) Performed: TOTAL LAPAROSCOPIC HYSTERECTOMY WITH SALPINGECTOMY (Bilateral Abdomen) LAPAROSCOPIC OOPHORECTOMY (Right Abdomen) CYSTOSCOPY (N/A Bladder) LAPAROSCOPIC LYSIS OF ADHESIONS (N/A Abdomen)     Anesthesia Post Evaluation  Last Vitals:  Vitals:   10/21/19 1123 10/21/19 1208  BP: 104/66 103/77  Pulse: 64 60  Resp: 15   Temp: (!) 36.4 C (!) 36.3 C  SpO2: 99% 97%    Last Pain:  Vitals:   10/21/19 1208  TempSrc:   PainSc: 3                  Barnet Glasgow

## 2019-10-21 NOTE — Discharge Summary (Signed)
Physician Discharge Summary  Patient ID: Courtney Donaldson MRN: [id number] DOB/AGE: [birth year] 43 y.o.  Admit date: 10/21/2019 Discharge date: 10/21/2019  Admission Diagnoses:   1.  Dysfunctional uterine bleeding. 2.  Right ovarian dermoid.  3.  Pelvic and abdominal adhesions.  Discharge Diagnoses:    1.  Dysfunctional uterine bleeding.  2.  Right ovarian dermoid.  3.  Pelvic and abdominal adhesions.  4.  Status post total laparoscopic hysterectomy with bilateral salpingectomy, right oophorectomy, lysis of pelvic adhesions, cystoscopy.   Active Problems:   Status post hysterectomy   Status post laparoscopic hysterectomy   Discharged Condition: good  Hospital Course: The patient was admitted on 10/21/19 and underwent a total laparoscopic hysterectomy with bilateral salpingectomy, right oophorectomy, lysis of pelvic adhesions, cystoscopy.  performed without complication while under general anesthesia.  The patient's post op course was uneventful.  She had a morphine PCA and Toradol for pain control initially, and this was converted over to Percocet and Motrin on post op day zero when the patient began taking po well.  She ambulated independently and wore PAS and Ted hose for DVT prophylaxis while in bed. She received Lovenox for DVT prophylaxis prior to surgery.  She voided spontaneously followin gher surgery.  The patient's vital signs remained stable and she demonstrated no signs of infection during her hospitalization.  The patient's post op day zero Hgb was 13.5.   She was tolerating the this well.   Her incision(s) demonstrated no signs of erythema or significant drainage.  She was found to be in good condition and ready for discharge on post op day zero.  Consults: None  Significant Diagnostic Studies: labs:  See Hgb in hospital course.   Treatments: surgery:  total laparoscopic hysterectomy with bilateral salpingectomy, right oophorectomy, lysis of pelvic adhesions,  cystoscopy.   Discharge Exam: Blood pressure 116/71, pulse 74, temperature 98.7 F (37.1 C), resp. rate 17, height 5\' 4"  (1.626 m), weight 79.3 kg, last menstrual period 10/17/2019, SpO2 99 %.   I/O - 1460 ml/1000 ml.  General appearance: alert and cooperative Resp: clear to auscultation bilaterally Cardio: regular rate and rhythm, S1, S2 normal, no murmur, click, rub or gallop GI: soft, non-tender; bowel sounds normal; no masses,  no organomegaly and incisions clean, dry, intact.  Extremities: PAS and Ted hose on.  DPs 2+ bilaterally.  Vagina:  minimal dark blood on pad.  Disposition: Discharge disposition: 01-Home or Self Care     Discharge instructions reviewed in verbal and written form.   Discharge Instructions     Discharge patient   Complete by: As directed    Discharge disposition: 01-Home or Self Care   Discharge patient date: 10/21/2019      Allergies as of 10/21/2019   No Known Allergies      Medication List     STOP taking these medications    levonorgestrel-ethinyl estradiol 0.15-0.03 MG tablet Commonly known as: SEASONALE   zolpidem 5 MG tablet Commonly known as: AMBIEN       TAKE these medications    amLODipine-benazepril 5-10 MG capsule Commonly known as: LOTREL Take 1 capsule by mouth daily.   ibuprofen 800 MG tablet Commonly known as: ADVIL Take 1 tablet (800 mg total) by mouth every 8 (eight) hours as needed.   oxyCODONE-acetaminophen 5-325 MG tablet Commonly known as: PERCOCET/ROXICET Take 1-2 tablets by mouth every 4 (four) hours as needed for moderate pain.       Follow-up Information     Yisroel Ramming,  Everardo All, MD In 1 week.   Specialty: Obstetrics and Gynecology Contact information: [address] [place] [address] [phone]            Signed: Arloa Koh 10/21/2019, 4:47 PM

## 2019-10-21 NOTE — Anesthesia Procedure Notes (Signed)
Procedure Name: Intubation Date/Time: 10/21/2019 7:45 AM Performed by: Bonney Aid, CRNA Pre-anesthesia Checklist: Patient identified, Emergency Drugs available, Suction available and Patient being monitored Patient Re-evaluated:Patient Re-evaluated prior to induction Oxygen Delivery Method: Circle system utilized Preoxygenation: Pre-oxygenation with 100% oxygen Induction Type: IV induction Ventilation: Mask ventilation without difficulty Laryngoscope Size: Mac and 3 Grade View: Grade I Tube type: Oral Tube size: 7.0 mm Number of attempts: 1 Airway Equipment and Method: Stylet Placement Confirmation: ETT inserted through vocal cords under direct vision,  positive ETCO2 and breath sounds checked- equal and bilateral Secured at: 21 cm Tube secured with: Tape Dental Injury: Teeth and Oropharynx as per pre-operative assessment

## 2019-10-21 NOTE — Transfer of Care (Signed)
Immediate Anesthesia Transfer of Care Note  Patient: Courtney Donaldson  Procedure(s) Performed: TOTAL LAPAROSCOPIC HYSTERECTOMY WITH SALPINGECTOMY (Bilateral Abdomen) LAPAROSCOPIC OOPHORECTOMY (Right Abdomen) CYSTOSCOPY (N/A Bladder) LAPAROSCOPIC LYSIS OF ADHESIONS (N/A Abdomen)  Patient Location: PACU  Anesthesia Type:General  Level of Consciousness: sedated  Airway & Oxygen Therapy: Patient Spontanous Breathing and Patient connected to nasal cannula oxygen  Post-op Assessment: Report given to RN and Post -op Vital signs reviewed and stable  Post vital signs: Reviewed and stable  Last Vitals:  Vitals Value Taken Time  BP 117/79 10/21/19 0949  Temp    Pulse 58 10/21/19 0950  Resp 13 10/21/19 0950  SpO2 100 % 10/21/19 0950  Vitals shown include unvalidated device data.  Last Pain:  Vitals:   10/21/19 0650  TempSrc: Oral  PainSc: 0-No pain      Patients Stated Pain Goal: 5 ([id number])  Complications: No apparent anesthesia complications

## 2019-10-21 NOTE — Op Note (Signed)
OPERATIVE REPORT  PREOPERATIVE DIAGNOSIS:  Dysfunctional uterine bleeding, right ovarian dermoid cyst.  POSTOPERATIVE DIAGNOSIS:  Dysfunctional uterine bleeding, right ovarian dermoid cyst, pelvic and abdominal adhesions  PROCEDURES:  Total laparoscopic hysterectomy with bilateral salpingectomy, right oophorectomy, lysis of pelvic adhesions, cystoscopy  SURGEON:  Lenard Galloway, M.D.  ASSISTANT:   Dorothy Spark, M.D.  ANESTHESIA:  General endotracheal, intraperitoneal ropivicaine 30 mL diluted in 30 mL of normal saline, local with 0.25% Marcaine.  IVF:   600 cc LR  ESTIMATED BLOOD LOSS:   25 cc  URINE OUTPUT:   [id number] cc  COMPLICATIONS:  None.  INDICATIONS FOR THE PROCEDURE:     The patient presents with abnormal uterine bleeding and a pelvic ultrasound documenting a right ovarian cyst consistent with a dermoid.  Her endometrial biopsy is benign.  She declines further medical therapy for her bleeding, and she declines future childbearing. She is requesting hysterectomy procedure.   A plan is made to proceed with a total laparoscopic hysterectomy with bilateral salpingectomy, right oophorectomy, possible left oophorectomy, and cystoscopy after risks, benefits, and alternatives are reviewed.  FINDINGS:     Laparoscopy revealed a uterus with a 1 cm anterior lower uterine segment subserosal fibroid.  There were adhesions of this area to the bladder peritoneum.  Her left ovary was enlarged compared to the right side.  There were bilateral adnexal adhesions.  The appendix and cecum appeared to be scared to the  Pelvic brim and the right abdominal sidewall, respectively.  The liver was normal.  No endometriosis was seen in the abdomen or pelvis.    Cystoscopy at the termination of the procedure showed the bladder to be normal throughout 360 degrees including the bladder dome and trigone. There was no evidence of any foreign body in the bladder or the urethra. There was no evidence of any  lesions  of the bladder or the urethra.   Both of the ureters were noted to be patent bilaterally.  SPECIMENS:     The uterus, cervix, right ovary, and bilateral tubes were went to pathology.   DESCRIPTION OF PROCEDURE:    The patient was reidentified in the preoperative hold area.   She did receive Cefotetan IV for antibiotic prophylaxis.  She received Lovenox, TED hose and PAS stockings for DVT prophylaxis.  In the operating room, the patient was placed in the dorsal lithotomy position on the operating room table.  The Trendguard was used to support her on the OR table.  Her legs were placed in the [place] stirrups and her arms were both padded and tucked at her sides. The patient received general endotracheal anesthesia. The abdomen and vagina were then sterilely prepped and she was sterilely draped.  A speculum was placed in the vagina and a single-tooth tenaculum was placed on the anterior cervical lip.  A figure-of-eight suture of 0 Vicryl was placed on each the anterior and the posterior cervical lips. The uterus was sounded to _ 8___ cm.  The cervix was then dilated with Hegar dilators.  A #  __8____ RUMI tip with a small metal KOH ring was then placed through the cervix and into the uterine cavity without difficulty.  The remaining vaginal instruments were then removed.  A Foley catheter was placed inside the bladder.  Attention was turned to the abdomen where the umbilical region was injected with 0.25% Marcaine and a small incision created.  A Veress needle was then used to insufflate the abdomen with CO2 gas after a saline  drop test was performed and the fluid flowed freely.  A 5 mm umbilical incision was created with a scalpel after the skin.  A 5 mm camera port was then placed using the Optiview.  5 mm incisions were then created in the left upper abdomen and the left lower abdomen after the skin was injected locally with 0.25% Marcaine.  The 5 mm trocars were then placed under  visualization of the laparoscope.  An 8 mm trocar was placed in the right lower quadrant after injecting with Marcaine and incising with a scalpel.     Ropivicine was placed inside the peritoneal cavity.   The patient was placed in Trendelenburg position.  An inspection of the abdomen and pelvis was performed. The findings are as noted above.   The lower uterine segment and bilateral adnexal adhesions were lysed with the Harmonic scalpel.  The bilateral ureters were identified.  The left fallopian tube was grasped and the Ligasure was used to cauterize and cut through the mesosalpinx and then the proximal tube.  The specimen was sent to pathology.  The left utero-ovarian ligament was similarly cauterized and cut with the Ligasure.  The left round ligament was then cauterized and divided with same instrument.  Dissection was performed to the anterior and posterior leaves of the broad ligaments using the Harmonic scalpel.  The incision was carried across the anterior cul- de-sac along the vesicouterine fold and the bladder was dissected away from the cervix using the Harmonic scalpel. The peritoneum was taken down posteriorly.  The left uterine artery was skeletonized.   It was then cauterized and cut with the Ligasure instrument.  Attention was turned to the patient's right-hand side at this time.  The right infundibulopelvic ligament was cauterized and cut with the Ligasure.  The Ligasure was used the cauterize and cut the right utero-ovarian and proximal fallopian tube, and the adnexal structures were placed in the cul de sac and then removed from the colpotomy incision later with the uterus. The same procedure that was performed on the left side was repeated on the right side with respect to the isolation, cautery, and transection of the round ligament and the bladder flap dissection.      The KOH ring was nicely visible.  The colpotomy incision was performed with the Harmonic scalpel in a  circumferential fashion. The specimen was then removed from the peritoneal cavity and was later sent to Pathology.  The vaginal cuff was sutured at this time using a running suture of 0 V-Loc.  The vagina was closed from the patient's right hand side to the left hand side and then back 2 sutures towards the midline.  This provided good full-thickness closure of the vaginal cuff.  The laparoscopic needle for suturing was removed from the peritoneal cavity.  The pelvis was irrigated and suctioned.   The pneumoperitoneal was let down.  There was good hemostasis of the operative sites and pedicles.  Carleene Overlie was placed in the pelvis over the operating sites.   The 8 mm trocar was removed and the Endoclose instrument was used to close the fascia with a 0/0 vicryl suture.   The remaining left sided laparoscopic trocars were removed after the CO2 pneumoperitoneum was released and the patient received manual breaths.    The patient's Foley catheter was removed at this time and cystoscopy was performed and the findings are as noted above.  The Foley catheter was left out.   Final inspection of the vagina demonstrated good hemostasis  of the vaginal cuff.  All skin incisions were closed with subcuticular sutures of 4-0 Vicryl with the exception of the umbilical incision which was closed with Dermabond.  Dermabond was placed over the remaining incisions.  This concluded the patient's procedure.  She was extubated and escorted to the recovery room in stable and awake condition.  There were no complications to the procedure.  All needle, instrument, and sponge counts were correct.

## 2019-10-21 NOTE — Discharge Instructions (Signed)

## 2019-10-22 LAB — SURGICAL PATHOLOGY

## 2019-10-22 NOTE — Progress Notes (Deleted)
GYNECOLOGY  VISIT   HPI: 43 y.o.   Single  African American  female   [place] with Patient's last menstrual period was 10/17/2019.here for 1 week follow up TOTAL LAPAROSCOPIC HYSTERECTOMY WITH SALPINGECTOMY (Bilateral Abdomen)  LAPAROSCOPIC OOPHORECTOMY (Right Abdomen)  CYSTOSCOPY (N/A Bladder)  LAPAROSCOPIC LYSIS OF ADHESIONS.   GYNECOLOGIC HISTORY: Patient's last menstrual period was 10/17/2019. Contraception:  Hysterectomy Menopausal hormone therapy:  none Last mammogram: 10-09-19 Neg/density B/Birads1 Last pap smear: 03/20/19 Neg:Neg HR HPV, 02-22-16 Neg:Neg HR HPV, 03-08-13 Neg:Neg HR HPV        OB History    Gravida  0   Para  0   Term  0   Preterm  0   AB  0   Living  0     SAB  0   TAB  0   Ectopic  0   Multiple  0   Live Births  0              Patient Active Problem List   Diagnosis Date Noted  . Status post hysterectomy 10/21/2019  . Status post laparoscopic hysterectomy 10/21/2019  . Hypertension, essential, benign 09/20/2019  . Cough, persistent 02/26/2019  . Allergic rhinitis 02/26/2019  . Ovarian mass, right 04/28/2018  . GERD (gastroesophageal reflux disease) 04/02/2018  . Circadian rhythm sleep disturbance 04/02/2018  . Hyperlipidemia 04/02/2018  . IUD (intrauterine device) in place 05/17/2013  . Class 2 obesity with body mass index (BMI) of 35.0 to 35.9 in adult 03/08/2013    Past Medical History:  Diagnosis Date  . Bronchitis 10/2013  . Depression   . Hypertension   . Low vitamin D level   . Right ovarian cyst 2015   Calcified cyst - possible dermoid.  Needs yearly ultrasound.    Past Surgical History:  Procedure Laterality Date  . CYSTOSCOPY N/A 10/21/2019   Procedure: CYSTOSCOPY;  Surgeon: Nunzio Cobbs, MD;  Location: [facility];  Service: Gynecology;  Laterality: N/A;  . INTRAUTERINE DEVICE (IUD) INSERTION  04/11/13   Mirena  . LAPAROSCOPIC LYSIS OF ADHESIONS N/A 10/21/2019   Procedure:  LAPAROSCOPIC LYSIS OF ADHESIONS;  Surgeon: Nunzio Cobbs, MD;  Location: [facility];  Service: Gynecology;  Laterality: N/A;  . TOTAL LAPAROSCOPIC HYSTERECTOMY WITH SALPINGECTOMY Bilateral 10/21/2019   Procedure: TOTAL LAPAROSCOPIC HYSTERECTOMY WITH SALPINGECTOMY;  Surgeon: Nunzio Cobbs, MD;  Location: [facility];  Service: Gynecology;  Laterality: Bilateral;  Extended recovery bed needed    Current Outpatient Medications  Medication Sig Dispense Refill  . amLODipine-benazepril (LOTREL) 5-10 MG capsule Take 1 capsule by mouth daily. 90 capsule 1  . ibuprofen (ADVIL) 800 MG tablet Take 1 tablet (800 mg total) by mouth every 8 (eight) hours as needed. 30 tablet 0  . oxyCODONE-acetaminophen (PERCOCET/ROXICET) 5-325 MG tablet Take 1-2 tablets by mouth every 4 (four) hours as needed for moderate pain. 30 tablet 0   No current facility-administered medications for this visit.     ALLERGIES: Patient has no known allergies.  Family History  Problem Relation Age of Onset  . Diabetes Mother   . Pancreatic cancer Paternal Aunt 76  . Breast cancer Paternal Aunt   . Diabetes Maternal Grandmother   . Diabetes Maternal Grandfather   . Cancer Maternal Grandfather   . Obesity Maternal Grandfather   . Breast cancer Paternal Grandmother 12       postmenopausal  . Cancer Paternal Grandmother   . Hypertension  Father   . Early death Father   . Hyperlipidemia Father   . Kidney disease Father   . Depression Sister   . Hypertension Sister   . Heart attack Paternal Grandfather   . Kidney failure Other 76    Social History   Socioeconomic History  . Marital status: Single    Spouse name: Not on file  . Number of children: 0  . Years of education: Not on file  . Highest education level: Not on file  Occupational History  . Not on file  Tobacco Use  . Smoking status: Never Smoker  . Smokeless tobacco: Never Used  Substance and Sexual  Activity  . Alcohol use: Yes    Alcohol/week: 1.0 - 2.0 standard drinks    Types: 1 - 2 Standard drinks or equivalent per week  . Drug use: Yes    Types: Marijuana    Comment: last use two weeks ago  . Sexual activity: Yes    Partners: Male  Other Topics Concern  . Not on file  Social History Narrative  . Not on file   Social Determinants of Health   Financial Resource Strain:   . Difficulty of Paying Living Expenses: Not on file  Food Insecurity:   . Worried About Charity fundraiser in the Last Year: Not on file  . Ran Out of Food in the Last Year: Not on file  Transportation Needs:   . Lack of Transportation (Medical): Not on file  . Lack of Transportation (Non-Medical): Not on file  Physical Activity:   . Days of Exercise per Week: Not on file  . Minutes of Exercise per Session: Not on file  Stress:   . Feeling of Stress : Not on file  Social Connections:   . Frequency of Communication with Friends and Family: Not on file  . Frequency of Social Gatherings with Friends and Family: Not on file  . Attends Religious Services: Not on file  . Active Member of Clubs or Organizations: Not on file  . Attends Archivist Meetings: Not on file  . Marital Status: Not on file  Intimate Partner Violence:   . Fear of Current or Ex-Partner: Not on file  . Emotionally Abused: Not on file  . Physically Abused: Not on file  . Sexually Abused: Not on file    Review of Systems  PHYSICAL EXAMINATION:    LMP 10/17/2019     General appearance: alert, cooperative and appears stated age Head: Normocephalic, without obvious abnormality, atraumatic Neck: no adenopathy, supple, symmetrical, trachea midline and thyroid normal to inspection and palpation Lungs: clear to auscultation bilaterally Breasts: normal appearance, no masses or tenderness, No nipple retraction or dimpling, No nipple discharge or bleeding, No axillary or supraclavicular adenopathy Heart: regular rate and  rhythm Abdomen: soft, non-tender, no masses,  no organomegaly Extremities: extremities normal, atraumatic, no cyanosis or edema Skin: Skin color, texture, turgor normal. No rashes or lesions Lymph nodes: Cervical, supraclavicular, and axillary nodes normal. No abnormal inguinal nodes palpated Neurologic: Grossly normal  Pelvic: External genitalia:  no lesions              Urethra:  normal appearing urethra with no masses, tenderness or lesions              Bartholins and Skenes: normal                 Vagina: normal appearing vagina with normal color and discharge, no lesions  Cervix: no lesions                Bimanual Exam:  Uterus:  normal size, contour, position, consistency, mobility, non-tender              Adnexa: no mass, fullness, tenderness              Rectal exam: {yes no:[id number]}.  Confirms.              Anus:  normal sphincter tone, no lesions  Chaperone was present for exam.  ASSESSMENT     PLAN     An After Visit Summary was printed and given to the patient.  ______ minutes face to face time of which over 50% was spent in counseling.

## 2019-10-23 NOTE — Progress Notes (Signed)
GYNECOLOGY  VISIT   HPI: 43 y.o.   Single  African American  female   [place] with Patient's last menstrual period was 10/17/2019.here for 1 week follow up TOTAL LAPAROSCOPIC HYSTERECTOMY WITH SALPINGECTOMY (Bilateral Abdomen)  LAPAROSCOPIC OOPHORECTOMY (Right Abdomen)  CYSTOSCOPY (N/A Bladder)  LAPAROSCOPIC LYSIS OF ADHESIONS.  Patient with some discomfort mainly on right side. Trying not to take her Percocet.  Taking Ibuprofen as needed.   Eating food well.   Had a BM yesterday that was soft.   Voiding well.   No vaginal bleeding.   GYNECOLOGIC HISTORY: Patient's last menstrual period was 10/17/2019. Contraception: Hyst Menopausal hormone therapy:  None Last mammogram: 10-09-19 Neg/density B/BiRads1 Last pap smear: 03/20/19 Neg:Neg HR HPV, 02-22-16 Neg:Neg HR HPV, 03-08-13 Neg:Neg HR HPV        OB History    Gravida  0   Para  0   Term  0   Preterm  0   AB  0   Living  0     SAB  0   TAB  0   Ectopic  0   Multiple  0   Live Births  0              Patient Active Problem List   Diagnosis Date Noted  . Status post hysterectomy 10/21/2019  . Status post laparoscopic hysterectomy 10/21/2019  . Hypertension, essential, benign 09/20/2019  . Cough, persistent 02/26/2019  . Allergic rhinitis 02/26/2019  . Ovarian mass, right 04/28/2018  . GERD (gastroesophageal reflux disease) 04/02/2018  . Circadian rhythm sleep disturbance 04/02/2018  . Hyperlipidemia 04/02/2018  . IUD (intrauterine device) in place 05/17/2013  . Class 2 obesity with body mass index (BMI) of 35.0 to 35.9 in adult 03/08/2013    Past Medical History:  Diagnosis Date  . Bronchitis 10/2013  . Depression   . Hypertension   . Low vitamin D level   . Right ovarian cyst 2015   Calcified cyst - possible dermoid.  Needs yearly ultrasound.    Past Surgical History:  Procedure Laterality Date  . CYSTOSCOPY N/A 10/21/2019   Procedure: CYSTOSCOPY;  Surgeon: Nunzio Cobbs, MD;   Location: [facility];  Service: Gynecology;  Laterality: N/A;  . INTRAUTERINE DEVICE (IUD) INSERTION  04/11/13   Mirena  . LAPAROSCOPIC LYSIS OF ADHESIONS N/A 10/21/2019   Procedure: LAPAROSCOPIC LYSIS OF ADHESIONS;  Surgeon: Nunzio Cobbs, MD;  Location: [facility];  Service: Gynecology;  Laterality: N/A;  . TOTAL LAPAROSCOPIC HYSTERECTOMY WITH SALPINGECTOMY Bilateral 10/21/2019   Procedure: TOTAL LAPAROSCOPIC HYSTERECTOMY WITH SALPINGECTOMY;  Surgeon: Nunzio Cobbs, MD;  Location: [facility];  Service: Gynecology;  Laterality: Bilateral;  Extended recovery bed needed    Current Outpatient Medications  Medication Sig Dispense Refill  . amLODipine-benazepril (LOTREL) 5-10 MG capsule Take 1 capsule by mouth daily. 90 capsule 1  . ibuprofen (ADVIL) 800 MG tablet Take 1 tablet (800 mg total) by mouth every 8 (eight) hours as needed. 30 tablet 0  . oxyCODONE-acetaminophen (PERCOCET/ROXICET) 5-325 MG tablet Take 1-2 tablets by mouth every 4 (four) hours as needed for moderate pain. 30 tablet 0   No current facility-administered medications for this visit.     ALLERGIES: Patient has no known allergies.  Family History  Problem Relation Age of Onset  . Diabetes Mother   . Pancreatic cancer Paternal Aunt 55  . Breast cancer Paternal Aunt   . Diabetes Maternal Grandmother   .  Diabetes Maternal Grandfather   . Cancer Maternal Grandfather   . Obesity Maternal Grandfather   . Breast cancer Paternal Grandmother 56       postmenopausal  . Cancer Paternal Grandmother   . Hypertension Father   . Early death Father   . Hyperlipidemia Father   . Kidney disease Father   . Depression Sister   . Hypertension Sister   . Heart attack Paternal Grandfather   . Kidney failure Other 68    Social History   Socioeconomic History  . Marital status: Single    Spouse name: Not on file  . Number of children: 0  . Years of  education: Not on file  . Highest education level: Not on file  Occupational History  . Not on file  Tobacco Use  . Smoking status: Never Smoker  . Smokeless tobacco: Never Used  Substance and Sexual Activity  . Alcohol use: Yes    Alcohol/week: 1.0 - 2.0 standard drinks    Types: 1 - 2 Standard drinks or equivalent per week  . Drug use: Yes    Types: Marijuana    Comment: last use two weeks ago  . Sexual activity: Yes    Partners: Male  Other Topics Concern  . Not on file  Social History Narrative  . Not on file   Social Determinants of Health   Financial Resource Strain:   . Difficulty of Paying Living Expenses: Not on file  Food Insecurity:   . Worried About Charity fundraiser in the Last Year: Not on file  . Ran Out of Food in the Last Year: Not on file  Transportation Needs:   . Lack of Transportation (Medical): Not on file  . Lack of Transportation (Non-Medical): Not on file  Physical Activity:   . Days of Exercise per Week: Not on file  . Minutes of Exercise per Session: Not on file  Stress:   . Feeling of Stress : Not on file  Social Connections:   . Frequency of Communication with Friends and Family: Not on file  . Frequency of Social Gatherings with Friends and Family: Not on file  . Attends Religious Services: Not on file  . Active Member of Clubs or Organizations: Not on file  . Attends Archivist Meetings: Not on file  . Marital Status: Not on file  Intimate Partner Violence:   . Fear of Current or Ex-Partner: Not on file  . Emotionally Abused: Not on file  . Physically Abused: Not on file  . Sexually Abused: Not on file    Review of Systems  All other systems reviewed and are negative.   PHYSICAL EXAMINATION:    BP 124/80   Pulse (!) 56   Temp (!) 96.2 F (35.7 C) (Temporal)   Ht 5' 3.25" (1.607 m)   Wt 178 lb (80.7 kg)   LMP 10/17/2019   BMI 31.28 kg/m     General appearance: alert, cooperative and appears stated age    Abdomen: incisions intact.  Abdomen is soft, non-tender, no masses,  no organomegaly  Chaperone was present for exam.  ASSESSMENT  Status post laparoscopic hysterectomy, bilateral salpingectomy, right oophorectomy and lysis of adhesions for fibroids and right cystadenofibroma.  Doing well post op.  Elevated glucose preop.   PLAN  We reviewed her surgical findings, procedure, and pathology report.  She will not work for 8 weeks post op due to heavy lifting at her job.  Will check A1C today.  FU for 6 week visit.    An After Visit Summary was printed and given to the patient.

## 2019-10-28 ENCOUNTER — Ambulatory Visit: Payer: 59 | Admitting: Obstetrics and Gynecology

## 2019-10-28 ENCOUNTER — Encounter: Payer: Self-pay | Admitting: Obstetrics and Gynecology

## 2019-10-28 ENCOUNTER — Ambulatory Visit (INDEPENDENT_AMBULATORY_CARE_PROVIDER_SITE_OTHER): Payer: 59 | Admitting: Obstetrics and Gynecology

## 2019-10-28 ENCOUNTER — Other Ambulatory Visit: Payer: Self-pay

## 2019-10-28 VITALS — BP 124/80 | HR 56 | Temp 96.2°F | Ht 63.25 in | Wt 178.0 lb

## 2019-10-28 DIAGNOSIS — R7309 Other abnormal glucose: Secondary | ICD-10-CM

## 2019-10-28 DIAGNOSIS — Z9889 Other specified postprocedural states: Secondary | ICD-10-CM

## 2019-10-28 NOTE — Patient Instructions (Signed)
You had a cystadenofibroma of your right ovary.  This was benign.

## 2019-10-29 LAB — HEMOGLOBIN A1C
Est. average glucose Bld gHb Est-mCnc: 100 mg/dL
Hgb A1c MFr Bld: 5.1 % (ref 4.8–5.6)

## 2019-11-12 ENCOUNTER — Other Ambulatory Visit: Payer: Self-pay | Admitting: Obstetrics and Gynecology

## 2019-11-13 ENCOUNTER — Telehealth: Payer: Self-pay | Admitting: Obstetrics and Gynecology

## 2019-11-13 NOTE — Telephone Encounter (Signed)
Please have her contact her PCP about her cough and allergies.  I want to be sure she is properly assessed for Covid 19.   If her cough and constipation are controlled, her umbilical pain may improve.  Taking Miralax daily will help with constipation.   I am happy to see her for her umbilical or post op concerns if they persist.

## 2019-11-13 NOTE — Telephone Encounter (Signed)
Spoke with patient, advised as seen below per [name]. OV cancelled for 11/14/19. Patient verbalizes understanding and is agreeable.   Encounter closed.

## 2019-11-13 NOTE — Telephone Encounter (Signed)
Patient is calling regarding lower right abdominal pain, post op. Patient had surgery 10/21/2019. Patient stated that it is from her belly button to her incision. Patient stated that when she coughs, she feels a sharp pain.

## 2019-11-13 NOTE — Telephone Encounter (Signed)
Spoke to pt. Pt states having more constipation lately and having to strain and feels sharp pain in umbilical area x 1 week. Pt states having cough since that time too due to seasonal allergies. Pt has waited to call for pain. Has been taking Motrin and Oxy. Has 2 Oxy left and 6 Motrin left. Pt states drinking 3 bottles of water a day. Encouraged pt to drink more water for hydration and to help relieve constipation and to eat more fiber in diet.  Pt requesting to be seen to make sure everything is ok. Pt is scheduled for 6 week Post Op OV on 12/02/2019. Pt scheduled for OV with Dr Quincy Simmonds on 11/14/2019 at 4:30pm. Pt agreeable and verbalized understanding. CPS negative. Will review with Dr Quincy Simmonds and will return call with any additional information to pt. Pt agreeable.   Will route to Dr Quincy Simmonds for review and any additional recommendations.

## 2019-11-14 ENCOUNTER — Ambulatory Visit: Payer: Self-pay | Admitting: Obstetrics and Gynecology

## 2019-11-14 ENCOUNTER — Other Ambulatory Visit: Payer: Self-pay

## 2019-11-15 ENCOUNTER — Ambulatory Visit: Payer: 59 | Admitting: Family Medicine

## 2019-11-15 ENCOUNTER — Encounter: Payer: Self-pay | Admitting: Family Medicine

## 2019-11-15 ENCOUNTER — Ambulatory Visit (INDEPENDENT_AMBULATORY_CARE_PROVIDER_SITE_OTHER): Payer: 59

## 2019-11-15 VITALS — BP 128/80 | HR 76 | Temp 97.0°F | Resp 16 | Ht 63.25 in | Wt 175.0 lb

## 2019-11-15 DIAGNOSIS — Z90710 Acquired absence of both cervix and uterus: Secondary | ICD-10-CM

## 2019-11-15 DIAGNOSIS — K5900 Constipation, unspecified: Secondary | ICD-10-CM | POA: Diagnosis not present

## 2019-11-15 DIAGNOSIS — R1030 Lower abdominal pain, unspecified: Secondary | ICD-10-CM

## 2019-11-15 LAB — CBC WITH DIFFERENTIAL/PLATELET
Basophils Absolute: 0 10*3/uL (ref 0.0–0.1)
Basophils Relative: 0.5 % (ref 0.0–3.0)
Eosinophils Absolute: 0.1 10*3/uL (ref 0.0–0.7)
Eosinophils Relative: 1.8 % (ref 0.0–5.0)
HCT: 41.3 % (ref 36.0–46.0)
Hemoglobin: 13.3 g/dL (ref 12.0–15.0)
Lymphocytes Relative: 22.9 % (ref 12.0–46.0)
Lymphs Abs: 1.3 10*3/uL (ref 0.7–4.0)
MCHC: 32.3 g/dL (ref 30.0–36.0)
MCV: 93.6 fl (ref 78.0–100.0)
Monocytes Absolute: 0.5 10*3/uL (ref 0.1–1.0)
Monocytes Relative: 8.7 % (ref 3.0–12.0)
Neutro Abs: 3.7 10*3/uL (ref 1.4–7.7)
Neutrophils Relative %: 66.1 % (ref 43.0–77.0)
Platelets: 331 10*3/uL (ref 150.0–400.0)
RBC: 4.41 Mil/uL (ref 3.87–5.11)
RDW: 12.3 % (ref 11.5–15.5)
WBC: 5.6 10*3/uL (ref 4.0–10.5)

## 2019-11-15 LAB — URINALYSIS, ROUTINE W REFLEX MICROSCOPIC
Bilirubin Urine: NEGATIVE
Hgb urine dipstick: NEGATIVE
Ketones, ur: NEGATIVE
Leukocytes,Ua: NEGATIVE
Nitrite: NEGATIVE
Specific Gravity, Urine: 1.02 (ref 1.000–1.030)
Total Protein, Urine: NEGATIVE
Urine Glucose: NEGATIVE
Urobilinogen, UA: 1 (ref 0.0–1.0)
pH: 6.5 (ref 5.0–8.0)

## 2019-11-15 MED ORDER — LINACLOTIDE 145 MCG PO CAPS: 145.0000 ug | ORAL_CAPSULE | Freq: Every day | ORAL | 0 refills | Status: DC

## 2019-11-15 NOTE — Patient Instructions (Signed)
A few things to remember from today's visit:   Abdominal pain, lower - Plan: DG Abd 1 View, Urinalysis, Routine w reflex microscopic, CBC with Differential/Platelet  Constipation, unspecified constipation type - Plan: linaclotide (LINZESS) 145 MCG CAPS capsule  Opioid meds can aggravate constipation. Move frequent around your house,avoid prolonged rest. Increase fluid intake.  Follow a bland diet for the next few days, small meals, adequate hydration.   GET HELP RIGHT AWAY IF:   The pain is does not go away within 2 hours.  Sudden severe/worsening pain.  You keep throwing up (vomiting).  The pain changes and is only in the right or left part of the belly.  Not being able to pass gas or poop.  You have bloody or tarry looking poop.   MAKE SURE YOU:   Understand these instructions.  Will watch your condition.  Will get help right away if you are not doing well or get worse.   If symptoms are persistent please arrange a follow up appointment.     Please be sure medication list is accurate. If a new problem present, please set up appointment sooner than planned today.

## 2019-11-15 NOTE — Progress Notes (Signed)
ACUTE VISIT   HPI:  Chief Complaint  Patient presents with  . pain at incision site    [name] is a 44 y.o. female, who is here today complaining of abdominal pain as described above. She underwent laparoscopic hysterectomy on 10/21/19. States that abdominal pain after surgery was getting better but worsening for the past few days. According to pt,her gyn referred her to her PCP when she call to request refills on pain med. Completed treatment with Hydrocodone-Acetaminophen and Ibuprofen 800 mg,both helped.  Intermittent sharp pain worse for the past 7-10 days. + Cramps,6/10,no radiated. Alleviated by passing gas.. She has not identified exacerbating factors.  Urinary tenesmus but no dysuria,gross hematuria,or frequency. No odorous or cloudy urine.  Some constipation. Taking Miralax. Last bowel movement 3 days ago, hard stool. She describes it as "big balls",rectal tenesmus and straining. Negative for dyschezia and hematochezia.  Small amount of "creamy with little pink' vaginal discharge, which she was told it was expected after hysterectomy.  Denies fever,chills,body aches,dysphagia,N/V,or skin rash.  Review of Systems  Constitutional: Negative for activity change and appetite change.  HENT: Negative for mouth sores and nosebleeds.   Respiratory: Negative for cough, shortness of breath and wheezing.   Gastrointestinal: Negative for abdominal distention and blood in stool.  Genitourinary: Negative for decreased urine volume and genital sores.  Musculoskeletal: Negative for gait problem and myalgias.  Hematological: Negative for adenopathy. Does not bruise/bleed easily.  Rest see pertinent positives and negatives per HPI.   Current Outpatient Medications on File Prior to Visit  Medication Sig Dispense Refill  . amLODipine-benazepril (LOTREL) 5-10 MG capsule Take 1 capsule by mouth daily. 90 capsule 1  . ibuprofen (ADVIL) 800 MG tablet Take 1  tablet (800 mg total) by mouth every 8 (eight) hours as needed. 30 tablet 0   No current facility-administered medications on file prior to visit.     Past Medical History:  Diagnosis Date  . Bronchitis 10/2013  . Depression   . Hypertension   . Low vitamin D level   . Right ovarian cyst 2015   Calcified cyst - possible dermoid.  Needs yearly ultrasound.   No Known Allergies  Social History   Socioeconomic History  . Marital status: Single    Spouse name: Not on file  . Number of children: 0  . Years of education: Not on file  . Highest education level: Not on file  Occupational History  . Not on file  Tobacco Use  . Smoking status: Never Smoker  . Smokeless tobacco: Never Used  Substance and Sexual Activity  . Alcohol use: Yes    Alcohol/week: 1.0 - 2.0 standard drinks    Types: 1 - 2 Standard drinks or equivalent per week  . Drug use: Yes    Types: Marijuana    Comment: last use two weeks ago  . Sexual activity: Yes    Partners: Male  Other Topics Concern  . Not on file  Social History Narrative  . Not on file   Social Determinants of Health   Financial Resource Strain:   . Difficulty of Paying Living Expenses: Not on file  Food Insecurity:   . Worried About Charity fundraiser in the Last Year: Not on file  . Ran Out of Food in the Last Year: Not on file  Transportation Needs:   . Lack of Transportation (Medical): Not on file  . Lack of Transportation (Non-Medical): Not on file  Physical  Activity:   . Days of Exercise per Week: Not on file  . Minutes of Exercise per Session: Not on file  Stress:   . Feeling of Stress : Not on file  Social Connections:   . Frequency of Communication with Friends and Family: Not on file  . Frequency of Social Gatherings with Friends and Family: Not on file  . Attends Religious Services: Not on file  . Active Member of Clubs or Organizations: Not on file  . Attends Archivist Meetings: Not on file  . Marital  Status: Not on file    Vitals:   11/15/19 1056  BP: 128/80  Pulse: 76  Resp: 16  Temp: (!) 97 F (36.1 C)  SpO2: 99%   Body mass index is 30.76 kg/m.  Physical Exam  Nursing note and vitals reviewed. Constitutional: She is oriented to person, place, and time. She appears well-developed. She does not appear ill. No distress.  HENT:  Head: Normocephalic and atraumatic.  Mouth/Throat: Oropharynx is clear and moist and mucous membranes are normal.  Eyes: Conjunctivae are normal. No scleral icterus.  Cardiovascular: Normal rate and regular rhythm.  No murmur heard. Respiratory: Effort normal and breath sounds normal. No respiratory distress.  GI: Soft. Bowel sounds are normal. She exhibits no distension and no mass. There is no hepatomegaly. There is abdominal tenderness in the periumbilical area. There is no rebound, no guarding and no CVA tenderness.  Musculoskeletal:        General: No edema.  Lymphadenopathy:    She has no cervical adenopathy.       Right: No supraclavicular adenopathy present.  Neurological: She is alert and oriented to person, place, and time. She has normal strength. Gait normal.  Skin: Skin is warm. No rash noted. No erythema.  Abdominal wounds healed.  Psychiatric: She has a normal mood and affect.  Well groomed, good eye contact.   ASSESSMENT AND PLAN:   Courtney Donaldson was seen today for pain at incision site.  Diagnoses and all orders for this visit:  Orders Placed This Encounter  Procedures  . DG Abd 1 View  . Urinalysis, Routine w reflex microscopic  . CBC with Differential/Platelet    Lab Results  Component Value Date   WBC 5.6 11/15/2019   HGB 13.3 11/15/2019   HCT 41.3 11/15/2019   MCV 93.6 11/15/2019   PLT 331.0 11/15/2019   Abdominal pain, lower Possible etiologies discussed. No signs of acute abdomen. Most likely caused by constipation. Bland diet.  Instructed about warning signs. Further recommendations according to imaging  and lab results.  Constipation, unspecified constipation type Opioids will aggravate problem , so I do not recommend to continue. Adequate hydration and fiber intake. Miralax is not helping. Linzess side effects discussed.  -     linaclotide (LINZESS) 145 MCG CAPS capsule; Take 1 capsule (145 mcg total) by mouth daily before breakfast.  S/P laparoscopic hysterectomy It does not seem to be causing abdominal pain. Abdominal incisions healed and no suprapubic/lower abdomen pain with palpation during examination.   Return in about 10 days (around 11/25/2019) for abdominal pain.    Shemika Robbs G. Martinique, MD  [facility]. [place] office.

## 2019-11-25 ENCOUNTER — Ambulatory Visit: Payer: 59 | Attending: Internal Medicine

## 2019-11-25 DIAGNOSIS — Z8616 Personal history of COVID-19: Secondary | ICD-10-CM

## 2019-11-25 DIAGNOSIS — Z20822 Contact with and (suspected) exposure to covid-19: Secondary | ICD-10-CM

## 2019-11-25 HISTORY — DX: Personal history of COVID-19: Z86.16

## 2019-11-26 LAB — NOVEL CORONAVIRUS, NAA: SARS-CoV-2, NAA: DETECTED — AB

## 2019-11-27 ENCOUNTER — Telehealth (HOSPITAL_COMMUNITY): Payer: Self-pay | Admitting: Nurse Practitioner

## 2019-11-27 NOTE — Telephone Encounter (Signed)
Called to Discuss with patient about Covid symptoms and the use of bamlanivimab, a monoclonal antibody infusion for those with mild to moderate Covid symptoms and at a high risk of hospitalization.     Based on chart review, patient does not qualify for infusion but would like to screen patient.   Unable to reach her. Left message to return call.

## 2019-12-02 ENCOUNTER — Ambulatory Visit: Payer: 59 | Admitting: Obstetrics and Gynecology

## 2019-12-06 ENCOUNTER — Other Ambulatory Visit: Payer: Self-pay | Admitting: Family Medicine

## 2019-12-06 NOTE — Telephone Encounter (Signed)
Pt is requesting a Refill on Zolpidem Tartrate 5 mg. Pt wanted to let you know that she has tested positive for COVID on 11/25/2019. Thanks   [facility] [address])

## 2019-12-08 ENCOUNTER — Other Ambulatory Visit: Payer: Self-pay | Admitting: Family Medicine

## 2019-12-08 DIAGNOSIS — K5900 Constipation, unspecified: Secondary | ICD-10-CM

## 2019-12-10 MED ORDER — ZOLPIDEM TARTRATE 5 MG PO TABS: ORAL_TABLET | ORAL | 2 refills | Status: DC

## 2019-12-13 ENCOUNTER — Other Ambulatory Visit: Payer: Self-pay

## 2019-12-16 ENCOUNTER — Other Ambulatory Visit: Payer: Self-pay

## 2019-12-16 ENCOUNTER — Ambulatory Visit (INDEPENDENT_AMBULATORY_CARE_PROVIDER_SITE_OTHER): Payer: 59 | Admitting: Obstetrics and Gynecology

## 2019-12-16 ENCOUNTER — Encounter: Payer: Self-pay | Admitting: Obstetrics and Gynecology

## 2019-12-16 VITALS — BP 122/80 | HR 70 | Temp 97.2°F | Ht 63.25 in | Wt 172.6 lb

## 2019-12-16 DIAGNOSIS — Z9889 Other specified postprocedural states: Secondary | ICD-10-CM

## 2019-12-16 NOTE — Progress Notes (Signed)
GYNECOLOGY  VISIT   HPI: 44 y.o.   Single  African American  female   [place] with Patient's last menstrual period was 10/17/2019.   here for 8 week follow up TOTAL LAPAROSCOPIC HYSTERECTOMY WITH SALPINGECTOMY (Bilateral Abdomen)  LAPAROSCOPIC OOPHORECTOMY (Right Abdomen)  CYSTOSCOPY (N/A Bladder)  LAPAROSCOPIC LYSIS OF ADHESIONS (N/A Abdomen). Right ovarian cystadenofibroma.   Patient diagnosed with COVID 11-24-19.    Had intercourse on 11/20/19 and also 2/13 and 2/14.  Some pink tinge on 11/20/19 and felt pressure in the vagina. No discharge or bleeding on 2/13/or 2/14.   Having constipation and straining with BMs, which is not new.   Patient has not returned to work.  She is due to return next week.   GYNECOLOGIC HISTORY: Patient's last menstrual period was 10/17/2019. Contraception:  Hyst Menopausal hormone therapy:  none Last mammogram: 10-09-19 Neg/density B/BiRads1 Last pap smear:03/20/19 Neg:Neg HR HPV, 02-22-16 Neg:Neg HR HPV, 03-08-13 Neg:Neg HR HPV                 OB History    Gravida  0   Para  0   Term  0   Preterm  0   AB  0   Living  0     SAB  0   TAB  0   Ectopic  0   Multiple  0   Live Births  0              Patient Active Problem List   Diagnosis Date Noted  . Status post hysterectomy 10/21/2019  . Status post laparoscopic hysterectomy 10/21/2019  . Hypertension, essential, benign 09/20/2019  . Cough, persistent 02/26/2019  . Allergic rhinitis 02/26/2019  . Ovarian mass, right 04/28/2018  . GERD (gastroesophageal reflux disease) 04/02/2018  . Circadian rhythm sleep disturbance 04/02/2018  . Hyperlipidemia 04/02/2018  . IUD (intrauterine device) in place 05/17/2013  . Class 2 obesity with body mass index (BMI) of 35.0 to 35.9 in adult 03/08/2013    Past Medical History:  Diagnosis Date  . Bronchitis 10/2013  . Depression   . Hypertension   . Low vitamin D level   . Right ovarian cyst 2015   Calcified cyst - possible  dermoid.  Needs yearly ultrasound.    Past Surgical History:  Procedure Laterality Date  . CYSTOSCOPY N/A 10/21/2019   Procedure: CYSTOSCOPY;  Surgeon: Nunzio Cobbs, MD;  Location: [facility];  Service: Gynecology;  Laterality: N/A;  . INTRAUTERINE DEVICE (IUD) INSERTION  04/11/13   Mirena  . LAPAROSCOPIC LYSIS OF ADHESIONS N/A 10/21/2019   Procedure: LAPAROSCOPIC LYSIS OF ADHESIONS;  Surgeon: Nunzio Cobbs, MD;  Location: [facility];  Service: Gynecology;  Laterality: N/A;  . TOTAL LAPAROSCOPIC HYSTERECTOMY WITH SALPINGECTOMY Bilateral 10/21/2019   Procedure: TOTAL LAPAROSCOPIC HYSTERECTOMY WITH SALPINGECTOMY;  Surgeon: Nunzio Cobbs, MD;  Location: [facility];  Service: Gynecology;  Laterality: Bilateral;  Extended recovery bed needed    Current Outpatient Medications  Medication Sig Dispense Refill  . amLODipine-benazepril (LOTREL) 5-10 MG capsule Take 1 capsule by mouth daily. 90 capsule 1  . LINZESS 145 MCG CAPS capsule TAKE 1 CAPSULE (145 MCG TOTAL) BY MOUTH DAILY BEFORE BREAKFAST. 30 capsule 3  . zolpidem (AMBIEN) 5 MG tablet Take 0.5-1 tablets (2.5-5 mg total) by mouth at bedtime as needed for sleep 15 tablet 2   No current facility-administered medications for this visit.     ALLERGIES: Patient  has no known allergies.  Family History  Problem Relation Age of Onset  . Diabetes Mother   . Pancreatic cancer Paternal Aunt 25  . Breast cancer Paternal Aunt   . Diabetes Maternal Grandmother   . Diabetes Maternal Grandfather   . Cancer Maternal Grandfather   . Obesity Maternal Grandfather   . Breast cancer Paternal Grandmother 50       postmenopausal  . Cancer Paternal Grandmother   . Hypertension Father   . Early death Father   . Hyperlipidemia Father   . Kidney disease Father   . Depression Sister   . Hypertension Sister   . Heart attack Paternal Grandfather   . Kidney failure Other 2     Social History   Socioeconomic History  . Marital status: Single    Spouse name: Not on file  . Number of children: 0  . Years of education: Not on file  . Highest education level: Not on file  Occupational History  . Not on file  Tobacco Use  . Smoking status: Never Smoker  . Smokeless tobacco: Never Used  Substance and Sexual Activity  . Alcohol use: Yes    Alcohol/week: 1.0 - 2.0 standard drinks    Types: 1 - 2 Standard drinks or equivalent per week  . Drug use: Yes    Types: Marijuana    Comment: last use two weeks ago  . Sexual activity: Yes    Partners: Male  Other Topics Concern  . Not on file  Social History Narrative  . Not on file   Social Determinants of Health   Financial Resource Strain:   . Difficulty of Paying Living Expenses: Not on file  Food Insecurity:   . Worried About Charity fundraiser in the Last Year: Not on file  . Ran Out of Food in the Last Year: Not on file  Transportation Needs:   . Lack of Transportation (Medical): Not on file  . Lack of Transportation (Non-Medical): Not on file  Physical Activity:   . Days of Exercise per Week: Not on file  . Minutes of Exercise per Session: Not on file  Stress:   . Feeling of Stress : Not on file  Social Connections:   . Frequency of Communication with Friends and Family: Not on file  . Frequency of Social Gatherings with Friends and Family: Not on file  . Attends Religious Services: Not on file  . Active Member of Clubs or Organizations: Not on file  . Attends Archivist Meetings: Not on file  . Marital Status: Not on file  Intimate Partner Violence:   . Fear of Current or Ex-Partner: Not on file  . Emotionally Abused: Not on file  . Physically Abused: Not on file  . Sexually Abused: Not on file    Review of Systems  PHYSICAL EXAMINATION:    BP 122/80 (Cuff Size: Large)   Pulse 70   Temp (!) 97.2 F (36.2 C) (Temporal)   Ht 5' 3.25" (1.607 m)   Wt 172 lb 9.6 oz (78.3  kg)   LMP 10/17/2019   BMI 30.33 kg/m     General appearance: alert, cooperative and appears stated age  Abdomen: soft, non-tender, no masses,  no organomegaly.  Her incisions are intact.   Pelvic: External genitalia:  no lesions              Urethra:  normal appearing urethra with no masses, tenderness or lesions  Bartholins and Skenes: normal                 Vagina: normal appearing vagina with normal color and discharge, no lesions              Cervix: absent.  Cuff intact. Suture visible.                 Bimanual Exam:  Uterus:  absent              Adnexa: no mass, fullness, tenderness         Chaperone was present for exam.  ASSESSMENT  Status post laparoscopic hysterectomy with bilateral salpingectomy, right oophorectomy, lysis of adhesions, cystoscopy.  Doing well overall.   PLAN  Note will be written for her return to work.  I reinforced the recommendation to avoid intercourse for 12 weeks post op due to the risk of vaginal cuff dehiscence and evisceration.   FU in 5 weeks for final post op visit.

## 2019-12-17 ENCOUNTER — Telehealth: Payer: Self-pay | Admitting: Obstetrics and Gynecology

## 2019-12-17 NOTE — Telephone Encounter (Signed)
Left a message for patient to return in about 5 weeks (around 01/20/2020) for final post op with [name].

## 2019-12-17 NOTE — Progress Notes (Signed)
Return to work letter sent to patient via MyChart per patient request.

## 2020-01-15 ENCOUNTER — Encounter: Payer: Self-pay | Admitting: Family Medicine

## 2020-01-15 ENCOUNTER — Telehealth (INDEPENDENT_AMBULATORY_CARE_PROVIDER_SITE_OTHER): Payer: 59 | Admitting: Family Medicine

## 2020-01-15 VITALS — Ht 63.25 in

## 2020-01-15 DIAGNOSIS — F321 Major depressive disorder, single episode, moderate: Secondary | ICD-10-CM | POA: Diagnosis not present

## 2020-01-15 DIAGNOSIS — F419 Anxiety disorder, unspecified: Secondary | ICD-10-CM | POA: Diagnosis not present

## 2020-01-15 NOTE — Progress Notes (Signed)
Virtual Visit via Video Note   I connected with Courtney Donaldson on 01/15/20 by a video enabled telemedicine application and verified that I am speaking with the correct person using two identifiers.  Location patient: home Location provider:work office Persons participating in the virtual visit: patient, provider  I discussed the limitations of evaluation and management by telemedicine and the availability of in person appointments. The patient expressed understanding and agreed to proceed.  Chief Complaint  Patient presents with  . Anxiety  . Depression    HPI: Courtney Donaldson is a 44 yo female with Hx of depression,vit D def,and HTN c/o worsening anxiety. Dx'ed with COVID 19 on 11/25/2019. Anxiety has been worse since 12/17/19, when she went back to work. Depression and anxiety dx'ed in her 24's. She has not tried medication before. Feels depressed, does not want to go out or being in crowd. She is not sleeping well,having "acing thoughts" when in bed.  She has not had suicidal ideation or plan but sometimes she wishes to go to sleep and not wake up. States that she knows she will not try to hurt herself, she is afraid of doing so. Negative for Hx of bipolar disorder.  She wonders if problem is caused by hysterectomy or COVID 19 infection. She lives with her sister. No fore arms in her house.  FHx negative for psychiatric disorder.  ROS: See pertinent positives and negatives per HPI.  Past Medical History:  Diagnosis Date  . Bronchitis 10/2013  . Depression   . History of COVID-19 11/25/2019  . Hypertension   . Low vitamin D level   . Right ovarian cyst 2015   Calcified cyst - possible dermoid.  Needs yearly ultrasound.    Past Surgical History:  Procedure Laterality Date  . CYSTOSCOPY N/A 10/21/2019   Procedure: CYSTOSCOPY;  Surgeon: Nunzio Cobbs, MD;  Location: [facility];  Service: Gynecology;  Laterality: N/A;  . INTRAUTERINE DEVICE (IUD)  INSERTION  04/11/13   Mirena  . LAPAROSCOPIC LYSIS OF ADHESIONS N/A 10/21/2019   Procedure: LAPAROSCOPIC LYSIS OF ADHESIONS;  Surgeon: Nunzio Cobbs, MD;  Location: [facility];  Service: Gynecology;  Laterality: N/A;  . TOTAL LAPAROSCOPIC HYSTERECTOMY WITH SALPINGECTOMY Bilateral 10/21/2019   Procedure: TOTAL LAPAROSCOPIC HYSTERECTOMY WITH SALPINGECTOMY;  Surgeon: Nunzio Cobbs, MD;  Location: [facility];  Service: Gynecology;  Laterality: Bilateral;  Extended recovery bed needed    Family History  Problem Relation Age of Onset  . Diabetes Mother   . Pancreatic cancer Paternal Aunt 95  . Breast cancer Paternal Aunt   . Diabetes Maternal Grandmother   . Diabetes Maternal Grandfather   . Cancer Maternal Grandfather   . Obesity Maternal Grandfather   . Breast cancer Paternal Grandmother 29       postmenopausal  . Cancer Paternal Grandmother   . Hypertension Father   . Early death Father   . Hyperlipidemia Father   . Kidney disease Father   . Depression Sister   . Hypertension Sister   . Heart attack Paternal Grandfather   . Kidney failure Other 44    Social History   Socioeconomic History  . Marital status: Single    Spouse name: Not on file  . Number of children: 0  . Years of education: Not on file  . Highest education level: Not on file  Occupational History  . Not on file  Tobacco Use  . Smoking status: Never  Smoker  . Smokeless tobacco: Never Used  Substance and Sexual Activity  . Alcohol use: Yes    Alcohol/week: 1.0 - 2.0 standard drinks    Types: 1 - 2 Standard drinks or equivalent per week  . Drug use: Yes    Types: Marijuana    Comment: last use two weeks ago  . Sexual activity: Yes    Partners: Male  Other Topics Concern  . Not on file  Social History Narrative  . Not on file   Social Determinants of Health   Financial Resource Strain:   . Difficulty of Paying Living Expenses:   Food  Insecurity:   . Worried About Charity fundraiser in the Last Year:   . Arboriculturist in the Last Year:   Transportation Needs:   . Film/video editor (Medical):   Marland Kitchen Lack of Transportation (Non-Medical):   Physical Activity:   . Days of Exercise per Week:   . Minutes of Exercise per Session:   Stress:   . Feeling of Stress :   Social Connections:   . Frequency of Communication with Friends and Family:   . Frequency of Social Gatherings with Friends and Family:   . Attends Religious Services:   . Active Member of Clubs or Organizations:   . Attends Archivist Meetings:   Marland Kitchen Marital Status:   Intimate Partner Violence:   . Fear of Current or Ex-Partner:   . Emotionally Abused:   Marland Kitchen Physically Abused:   . Sexually Abused:     Current Outpatient Medications:  .  amLODipine-benazepril (LOTREL) 5-10 MG capsule, Take 1 capsule by mouth daily., Disp: 90 capsule, Rfl: 1 .  LINZESS 145 MCG CAPS capsule, TAKE 1 CAPSULE (145 MCG TOTAL) BY MOUTH DAILY BEFORE BREAKFAST., Disp: 30 capsule, Rfl: 3 .  zolpidem (AMBIEN) 5 MG tablet, Take 0.5-1 tablets (2.5-5 mg total) by mouth at bedtime as needed for sleep, Disp: 15 tablet, Rfl: 2  EXAM:  VITALS per patient if applicable:Ht 5' 123456" (1.607 m)   LMP 10/17/2019   BMI 30.33 kg/m   GENERAL: alert, oriented, appears well and in no acute distress  HEENT: atraumatic, conjunctiva clear, no obvious abnormalities on inspection.  NECK: normal movements of the head and neck  LUNGS: on inspection no signs of respiratory distress, breathing rate appears normal, no obvious gross SOB, gasping or wheezing  CV: no obvious cyanosis  Courtney: moves all visible extremities without noticeable abnormality  PSYCH/NEURO: pleasant and cooperative, depressed mood, no obvious anxiety, speech and thought processing grossly intact. Not suicidal.  ASSESSMENT AND PLAN:  Discussed the following assessment and plan:  Depression, major, single episode,  moderate ([place]) After discussion of a few options, she agrees with trying sertraline 25 mg daily. In 2 to 3 weeks if medication needs well-tolerated, she can increase dose of sertraline from 25 mg to 50 mg daily. We discussed possible side effects, including suicidal thoughts/ideation. She was clearly instructed about warning signs. Follow-up in 4 to 5 weeks, before if needed.  Anxiety disorder, unspecified type Sertraline 25 mg daily started today. Recommend psychotherapy. I thinks she needs to continue working but she could have reduced schedule. She does not think she should be allowed to work part-time but she will inquire about this. Excuse work note for today will be given.                I discussed the assessment and treatment plan with the patient. Courtney Mccan was  provided an opportunity to ask questions and all were answered. She agreed with the plan and demonstrated an understanding of the instructions.    Return in about 4 weeks (around 02/12/2020) for 4-5 weeks.   Amous Crewe Martinique, MD

## 2020-01-16 ENCOUNTER — Other Ambulatory Visit: Payer: Self-pay | Admitting: Obstetrics and Gynecology

## 2020-01-16 ENCOUNTER — Telehealth: Payer: Self-pay | Admitting: Family Medicine

## 2020-01-16 NOTE — Telephone Encounter (Addendum)
Pt stated that Martinique was suppose to call her in a prescription they discuss yesterday-Sertraline 25 MG  [facility] [id number] [phone]   This note is not being shared with the patient for the following reason: To respect privacy (The patient or proxy has requested that the information not be shared).

## 2020-01-17 MED ORDER — SERTRALINE HCL 25 MG PO TABS: 25.0000 mg | ORAL_TABLET | Freq: Every day | ORAL | 1 refills | Status: DC

## 2020-01-17 NOTE — Telephone Encounter (Signed)
Reviewed pcp's note from 3/17. Rx sent in.

## 2020-01-21 ENCOUNTER — Ambulatory Visit: Payer: 59 | Admitting: Obstetrics and Gynecology

## 2020-01-24 ENCOUNTER — Other Ambulatory Visit: Payer: Self-pay

## 2020-01-27 ENCOUNTER — Encounter: Payer: Self-pay | Admitting: Obstetrics and Gynecology

## 2020-01-27 ENCOUNTER — Other Ambulatory Visit: Payer: Self-pay

## 2020-01-27 ENCOUNTER — Ambulatory Visit (INDEPENDENT_AMBULATORY_CARE_PROVIDER_SITE_OTHER): Payer: 59 | Admitting: Obstetrics and Gynecology

## 2020-01-27 VITALS — BP 138/82 | HR 76 | Temp 97.0°F | Ht 63.25 in | Wt 164.0 lb

## 2020-01-27 DIAGNOSIS — Z9889 Other specified postprocedural states: Secondary | ICD-10-CM

## 2020-01-27 NOTE — Progress Notes (Signed)
GYNECOLOGY  VISIT   HPI: 44 y.o.   Single  African American  female   [place] with Patient's last menstrual period was 10/17/2019.   here for 12 week follow up TOTAL LAPAROSCOPIC HYSTERECTOMY WITH SALPINGECTOMY (Bilateral Abdomen)  LAPAROSCOPIC OOPHORECTOMY (Right Abdomen)  CYSTOSCOPY (N/A Bladder)  LAPAROSCOPIC LYSIS OF ADHESIONS (N/A Abdomen).  States she is having some pelvic pain.  She is holding her urine at work and thinks this is the cause.   She has been sexually active since week 11 post op.  No vaginal bleeding.   Still has constipation.  Not taking Linzess.   Lost 10 pounds.  Feeling depressed.  Started on Sertraline 2 weeks ago.  Anxiety is less now.  Not happy with work.   Some vaginal dryness.  No hot flashes.     GYNECOLOGIC HISTORY: Patient's last menstrual period was 10/17/2019. Contraception:  Hyst Menopausal hormone therapy:  none Last mammogram: 10-09-19 Neg/density B/BiRads1 Last pap smear: 03/20/19 Neg:Neg HR HPV, 02-22-16 Neg:Neg HR HPV, 03-08-13 Neg:Neg HR HPV        OB History    Gravida  0   Para  0   Term  0   Preterm  0   AB  0   Living  0     SAB  0   TAB  0   Ectopic  0   Multiple  0   Live Births  0              Patient Active Problem List   Diagnosis Date Noted  . Status post hysterectomy 10/21/2019  . Status post laparoscopic hysterectomy 10/21/2019  . Hypertension, essential, benign 09/20/2019  . Cough, persistent 02/26/2019  . Allergic rhinitis 02/26/2019  . Ovarian mass, right 04/28/2018  . GERD (gastroesophageal reflux disease) 04/02/2018  . Circadian rhythm sleep disturbance 04/02/2018  . Hyperlipidemia 04/02/2018  . IUD (intrauterine device) in place 05/17/2013  . Class 2 obesity with body mass index (BMI) of 35.0 to 35.9 in adult 03/08/2013    Past Medical History:  Diagnosis Date  . Bronchitis 10/2013  . Depression   . History of COVID-19 11/25/2019  . Hypertension   . Low vitamin D level   .  Right ovarian cyst 2015   Calcified cyst - possible dermoid.  Needs yearly ultrasound.    Past Surgical History:  Procedure Laterality Date  . CYSTOSCOPY N/A 10/21/2019   Procedure: CYSTOSCOPY;  Surgeon: Nunzio Cobbs, MD;  Location: [facility];  Service: Gynecology;  Laterality: N/A;  . INTRAUTERINE DEVICE (IUD) INSERTION  04/11/13   Mirena  . LAPAROSCOPIC LYSIS OF ADHESIONS N/A 10/21/2019   Procedure: LAPAROSCOPIC LYSIS OF ADHESIONS;  Surgeon: Nunzio Cobbs, MD;  Location: [facility];  Service: Gynecology;  Laterality: N/A;  . TOTAL LAPAROSCOPIC HYSTERECTOMY WITH SALPINGECTOMY Bilateral 10/21/2019   Procedure: TOTAL LAPAROSCOPIC HYSTERECTOMY WITH SALPINGECTOMY;  Surgeon: Nunzio Cobbs, MD;  Location: [facility];  Service: Gynecology;  Laterality: Bilateral;  Extended recovery bed needed    Current Outpatient Medications  Medication Sig Dispense Refill  . amLODipine-benazepril (LOTREL) 5-10 MG capsule Take 1 capsule by mouth daily. 90 capsule 1  . LINZESS 145 MCG CAPS capsule TAKE 1 CAPSULE (145 MCG TOTAL) BY MOUTH DAILY BEFORE BREAKFAST. 30 capsule 3  . sertraline (ZOLOFT) 25 MG tablet Take 1 tablet (25 mg total) by mouth daily. 30 tablet 1   No current facility-administered medications for this  visit.     ALLERGIES: Patient has no known allergies.  Family History  Problem Relation Age of Onset  . Diabetes Mother   . Pancreatic cancer Paternal Aunt 27  . Breast cancer Paternal Aunt   . Diabetes Maternal Grandmother   . Diabetes Maternal Grandfather   . Cancer Maternal Grandfather   . Obesity Maternal Grandfather   . Breast cancer Paternal Grandmother 82       postmenopausal  . Cancer Paternal Grandmother   . Hypertension Father   . Early death Father   . Hyperlipidemia Father   . Kidney disease Father   . Depression Sister   . Hypertension Sister   . Heart attack Paternal Grandfather   .  Kidney failure Other 66    Social History   Socioeconomic History  . Marital status: Single    Spouse name: Not on file  . Number of children: 0  . Years of education: Not on file  . Highest education level: Not on file  Occupational History  . Not on file  Tobacco Use  . Smoking status: Never Smoker  . Smokeless tobacco: Never Used  Substance and Sexual Activity  . Alcohol use: Yes    Alcohol/week: 1.0 - 2.0 standard drinks    Types: 1 - 2 Standard drinks or equivalent per week  . Drug use: Yes    Types: Marijuana    Comment: last use two weeks ago  . Sexual activity: Yes    Partners: Male  Other Topics Concern  . Not on file  Social History Narrative  . Not on file   Social Determinants of Health   Financial Resource Strain:   . Difficulty of Paying Living Expenses:   Food Insecurity:   . Worried About Charity fundraiser in the Last Year:   . Arboriculturist in the Last Year:   Transportation Needs:   . Film/video editor (Medical):   Marland Kitchen Lack of Transportation (Non-Medical):   Physical Activity:   . Days of Exercise per Week:   . Minutes of Exercise per Session:   Stress:   . Feeling of Stress :   Social Connections:   . Frequency of Communication with Friends and Family:   . Frequency of Social Gatherings with Friends and Family:   . Attends Religious Services:   . Active Member of Clubs or Organizations:   . Attends Archivist Meetings:   Marland Kitchen Marital Status:   Intimate Partner Violence:   . Fear of Current or Ex-Partner:   . Emotionally Abused:   Marland Kitchen Physically Abused:   . Sexually Abused:     Review of Systems  Psychiatric/Behavioral:       Depression--no suicidal ideations  All other systems reviewed and are negative.   PHYSICAL EXAMINATION:    BP 138/82   Pulse 76   Temp (!) 97 F (36.1 C) (Temporal)   Ht 5' 3.25" (1.607 m)   Wt 164 lb (74.4 kg)   LMP 10/17/2019   BMI 28.82 kg/m     General appearance: alert, cooperative and  appears stated age  Pelvic: External genitalia:  no lesions              Urethra:  normal appearing urethra with no masses, tenderness or lesions              Bartholins and Skenes: normal                 Vagina:  normal appearing vagina with normal color and discharge, no lesions.  Vaginal cuff intact and is well healed.              Cervix: absent                Bimanual Exam:  Uterus: absent              Adnexa: no mass, fullness, tenderness   Chaperone was present for exam.  ASSESSMENT   Status post laparoscopic hysterectomy with bilateral salpingectomy, right oophorectomy.  Doing well post op.  PLAN  May return to all normal activities.  Annual exam in September, 2021.  Call for increasing abdominal pain.    An After Visit Summary was printed and given to the patient.

## 2020-03-11 ENCOUNTER — Other Ambulatory Visit: Payer: Self-pay | Admitting: *Deleted

## 2020-03-11 ENCOUNTER — Telehealth: Payer: Self-pay | Admitting: Family Medicine

## 2020-03-11 NOTE — Telephone Encounter (Signed)
Pt would like a referral to psychiatrist or psychology--She states she has been depressed that she cannot think clearly and it has been affecting her work.   Pt can be reached at [phone]

## 2020-03-11 NOTE — Telephone Encounter (Signed)
Please advise. Last ov 01/15/2020 for depression and anxiety.

## 2020-03-11 NOTE — Telephone Encounter (Signed)
Please explain that for psychiatric or psychologic evaluation referral is not required. We can provide a list of psychiatrists in the area, so she can call and arrange an appointment.  If she is told she needs a referral, we can place it for a particular provider. Thanks, BJ

## 2020-03-11 NOTE — Telephone Encounter (Signed)
Spoke with patient and informed her that per Dr. [place]. Patient informed that I can mail her a list of offices in the area. Patient first stated that insurance said she needed a referral and then when I explained the process to her, patient stated the insurance did not say that, good bye and hung up. Mailed a list of offices to address on file.

## 2020-03-18 ENCOUNTER — Telehealth: Payer: Self-pay

## 2020-03-18 NOTE — Telephone Encounter (Signed)
Patient is calling in regards to wanting an appointment to get her hormones checked.

## 2020-03-18 NOTE — Telephone Encounter (Signed)
Spoke with patient. S/p laparoscopic hysterectomy with bilateral salpingectomy, right oophorectomy 10/21/19. Patient reports she has felt depressed for the past 2 months, requesting to get her hormone levels checked. Patient is currently out of work, denies any other life changes or stressors. Reports vaginal dryness and odor in her urine, this has been present for 1 month. Decreased libido. Denies SI/HI. Denies flank pain, urinary frequency/urgency, fever/chills, vag d/c or bleeding.   OV recommended, patient agreeable. OV scheduled for 5/20 at 8am with [name]. Covid 19 prescreen negative, precautions reviewed.   Routing to provider for final review. Patient is agreeable to disposition. Will close encounter.

## 2020-03-19 ENCOUNTER — Encounter: Payer: Self-pay | Admitting: Obstetrics and Gynecology

## 2020-03-19 ENCOUNTER — Ambulatory Visit: Payer: 59 | Admitting: Obstetrics and Gynecology

## 2020-03-19 ENCOUNTER — Telehealth: Payer: Self-pay

## 2020-03-19 ENCOUNTER — Other Ambulatory Visit: Payer: Self-pay

## 2020-03-19 VITALS — BP 128/86 | HR 70 | Temp 97.4°F | Ht 63.25 in | Wt 158.0 lb

## 2020-03-19 DIAGNOSIS — R8290 Unspecified abnormal findings in urine: Secondary | ICD-10-CM | POA: Diagnosis not present

## 2020-03-19 DIAGNOSIS — R7989 Other specified abnormal findings of blood chemistry: Secondary | ICD-10-CM

## 2020-03-19 DIAGNOSIS — F32A Depression, unspecified: Secondary | ICD-10-CM

## 2020-03-19 DIAGNOSIS — F329 Major depressive disorder, single episode, unspecified: Secondary | ICD-10-CM | POA: Diagnosis not present

## 2020-03-19 DIAGNOSIS — N952 Postmenopausal atrophic vaginitis: Secondary | ICD-10-CM | POA: Diagnosis not present

## 2020-03-19 LAB — POCT URINALYSIS DIPSTICK
Bilirubin, UA: NEGATIVE
Blood, UA: NEGATIVE
Glucose, UA: NEGATIVE
Ketones, UA: NEGATIVE
Nitrite, UA: NEGATIVE
Protein, UA: NEGATIVE
Urobilinogen, UA: 0.2 E.U./dL
pH, UA: 5 (ref 5.0–8.0)

## 2020-03-19 NOTE — Progress Notes (Signed)
GYNECOLOGY  VISIT   HPI: 44 y.o.   Single  African American  female   [place] with Patient's last menstrual period was 10/17/2019.   here for vaginal dryness and odor in urine. She also states she has been depressed the past 2 months.  Patient was seen by psychiatrist 1 week ago and changed her medications.  She in Remron and Effexor.   No hot flashes or night sweats.  She had decreased interest in sex and is having vaginal dryness.   She had Covid 11/25/19.  Urine dip - trace WBC.   GYNECOLOGIC HISTORY: Patient's last menstrual period was 10/17/2019. Contraception:  Hyst Menopausal hormone therapy: none Last mammogram: 10-09-19 Neg/density B/BiRads1 Last pap smear: 03/20/19 Neg:Neg HR HPV, 02-22-16 Neg:Neg HR HPV, 03-08-13 Neg:Neg HR HPV        OB History    Gravida  0   Para  0   Term  0   Preterm  0   AB  0   Living  0     SAB  0   TAB  0   Ectopic  0   Multiple  0   Live Births  0              Patient Active Problem List   Diagnosis Date Noted  . Status post hysterectomy 10/21/2019  . Status post laparoscopic hysterectomy 10/21/2019  . Hypertension, essential, benign 09/20/2019  . Cough, persistent 02/26/2019  . Allergic rhinitis 02/26/2019  . Ovarian mass, right 04/28/2018  . GERD (gastroesophageal reflux disease) 04/02/2018  . Circadian rhythm sleep disturbance 04/02/2018  . Hyperlipidemia 04/02/2018  . IUD (intrauterine device) in place 05/17/2013  . Class 2 obesity with body mass index (BMI) of 35.0 to 35.9 in adult 03/08/2013    Past Medical History:  Diagnosis Date  . Bronchitis 10/2013  . Depression   . History of COVID-19 11/25/2019  . Hypertension   . Low vitamin D level   . Right ovarian cyst 2015   Calcified cyst - possible dermoid.  Needs yearly ultrasound.    Past Surgical History:  Procedure Laterality Date  . CYSTOSCOPY N/A 10/21/2019   Procedure: CYSTOSCOPY;  Surgeon: Nunzio Cobbs, MD;  Location: [facility];  Service: Gynecology;  Laterality: N/A;  . INTRAUTERINE DEVICE (IUD) INSERTION  04/11/13   Mirena  . LAPAROSCOPIC LYSIS OF ADHESIONS N/A 10/21/2019   Procedure: LAPAROSCOPIC LYSIS OF ADHESIONS;  Surgeon: Nunzio Cobbs, MD;  Location: [facility];  Service: Gynecology;  Laterality: N/A;  . TOTAL LAPAROSCOPIC HYSTERECTOMY WITH SALPINGECTOMY Bilateral 10/21/2019   Procedure: TOTAL LAPAROSCOPIC HYSTERECTOMY WITH SALPINGECTOMY AND RIGHT OOPHORECTOMY; Surgeon: Nunzio Cobbs, MD;  Location: [facility];  Service: Gynecology;  Laterality: Bilateral;  Extended recovery bed needed    Current Outpatient Medications  Medication Sig Dispense Refill  . amLODipine-benazepril (LOTREL) 5-10 MG capsule Take 1 capsule by mouth daily. 90 capsule 1  . LINZESS 145 MCG CAPS capsule TAKE 1 CAPSULE (145 MCG TOTAL) BY MOUTH DAILY BEFORE BREAKFAST. 30 capsule 3  . mirtazapine (REMERON) 15 MG tablet Take 15 mg by mouth at bedtime.    Marland Kitchen venlafaxine XR (EFFEXOR-XR) 37.5 MG 24 hr capsule Take 1 capsule by mouth daily.     No current facility-administered medications for this visit.     ALLERGIES: Patient has no known allergies.  Family History  Problem Relation Age of Onset  . Diabetes Mother   .  Pancreatic cancer Paternal Aunt 37  . Breast cancer Paternal Aunt   . Diabetes Maternal Grandmother   . Diabetes Maternal Grandfather   . Cancer Maternal Grandfather   . Obesity Maternal Grandfather   . Breast cancer Paternal Grandmother 19       postmenopausal  . Cancer Paternal Grandmother   . Hypertension Father   . Early death Father   . Hyperlipidemia Father   . Kidney disease Father   . Depression Sister   . Hypertension Sister   . Heart attack Paternal Grandfather   . Kidney failure Other 80    Social History   Socioeconomic History  . Marital status: Single    Spouse name: Not on file  . Number of children: 0  . Years of  education: Not on file  . Highest education level: Not on file  Occupational History  . Not on file  Tobacco Use  . Smoking status: Never Smoker  . Smokeless tobacco: Never Used  Substance and Sexual Activity  . Alcohol use: Yes    Alcohol/week: 1.0 - 2.0 standard drinks    Types: 1 - 2 Standard drinks or equivalent per week  . Drug use: Yes    Types: Marijuana    Comment: last use two weeks ago  . Sexual activity: Yes    Partners: Male  Other Topics Concern  . Not on file  Social History Narrative  . Not on file   Social Determinants of Health   Financial Resource Strain:   . Difficulty of Paying Living Expenses:   Food Insecurity:   . Worried About Charity fundraiser in the Last Year:   . Arboriculturist in the Last Year:   Transportation Needs:   . Film/video editor (Medical):   Marland Kitchen Lack of Transportation (Non-Medical):   Physical Activity:   . Days of Exercise per Week:   . Minutes of Exercise per Session:   Stress:   . Feeling of Stress :   Social Connections:   . Frequency of Communication with Friends and Family:   . Frequency of Social Gatherings with Friends and Family:   . Attends Religious Services:   . Active Member of Clubs or Organizations:   . Attends Archivist Meetings:   Marland Kitchen Marital Status:   Intimate Partner Violence:   . Fear of Current or Ex-Partner:   . Emotionally Abused:   Marland Kitchen Physically Abused:   . Sexually Abused:     Review of Systems  Genitourinary:       Odor in urine  Psychiatric/Behavioral:       Panic attacks, depression  All other systems reviewed and are negative.   PHYSICAL EXAMINATION:    BP 128/86   Pulse 70   Temp (!) 97.4 F (36.3 C) (Temporal)   Ht 5' 3.25" (1.607 m)   Wt 158 lb (71.7 kg)   LMP 10/17/2019   BMI 27.77 kg/m     General appearance: alert, cooperative and appears stated age  Pelvic: External genitalia:  no lesions              Urethra:  normal appearing urethra with no masses,  tenderness or lesions              Bartholins and Skenes: normal                 Vagina: normal appearing vagina with normal color and discharge, no lesions  Cervix: absent                Bimanual Exam:  Uterus: absent              Adnexa: no mass, fullness, tenderness        Chaperone was present for exam.  ASSESSMENT  Depression.  Vaginal atrophy.  Mild.  Hx Covid.  Urinary odor.  Status post laparoscopic hysterectomy, bilateral salpingectomy, and right oophorectomy.    PLAN  We discussed expected normal ovarian function with one remaining ovary.  Will check FSH, E2, TSH, vit D.  We discussed water based products, cooking oils, vit E suppositories, and local vaginal estrogen.  She will try OTC products first. Urine micro and culture.  She will follow up with her PCP if labs are normal and she is not responding to her antidepressants.   An After Visit Summary was printed and given to the patient.  __25____ minutes face to face time of which over 50% was spent in counseling.

## 2020-03-19 NOTE — Patient Instructions (Signed)
Atrophic Vaginitis  Atrophic vaginitis is a condition in which the tissues that line the vagina become dry and thin. This condition is most common in women who have stopped having regular menstrual periods (are in menopause). This usually starts when a woman is 45-43 years old. That is the time when a woman's estrogen levels begin to drop (decrease). Estrogen is a female hormone. It helps to keep the tissues of the vagina moist. It stimulates the vagina to produce a clear fluid that lubricates the vagina for sexual intercourse. This fluid also protects the vagina from infection. Lack of estrogen can cause the lining of the vagina to get thinner and dryer. The vagina may also shrink in size. It may become less elastic. Atrophic vaginitis tends to get worse over time as a woman's estrogen level drops. What are the causes? This condition is caused by the normal drop in estrogen that happens around the time of menopause. What increases the risk? Certain conditions or situations may lower a woman's estrogen level, leading to a higher risk for atrophic vaginitis. You are more likely to develop this condition if:  You are taking medicines that block estrogen.  You have had your ovaries removed.  You are being treated for cancer with X-ray (radiation) or medicines (chemotherapy).  You have given birth or are breastfeeding.  You are older than age 50.  You smoke. What are the signs or symptoms? Symptoms of this condition include:  Pain, soreness, or bleeding during sexual intercourse (dyspareunia).  Vaginal burning, irritation, or itching.  Pain or bleeding when a speculum is used in a vaginal exam (pelvic exam).  Having burning pain when passing urine.  Vaginal discharge that is brown or yellow. In some cases, there are no symptoms. How is this diagnosed? This condition is diagnosed by taking a medical history and doing a physical exam. This will include a pelvic exam that checks the  vaginal tissues. Though rare, you may also have other tests, including:  A urine test.  A test that checks the acid balance in your vagina (acid balance test). How is this treated? Treatment for this condition depends on how severe your symptoms are. Treatment may include:  Using an over-the-counter vaginal lubricant before sex.  Using a long-acting vaginal moisturizer.  Using low-dose vaginal estrogen for moderate to severe symptoms that do not respond to other treatments. Options include creams, tablets, and inserts (vaginal rings). Before you use a vaginal estrogen, tell your health care provider if you have a history of: ? Breast cancer. ? Endometrial cancer. ? Blood clots. If you are not sexually active and your symptoms are very mild, you may not need treatment. Follow these instructions at home: Medicines  Take over-the-counter and prescription medicines only as told by your health care provider. Do not use herbal or alternative medicines unless your health care provider says that you can.  Use over-the-counter creams, lubricants, or moisturizers for dryness only as directed by your health care provider. General instructions  If your atrophic vaginitis is caused by menopause, discuss all of your menopause symptoms and treatment options with your health care provider.  Do not douche.  Do not use products that can make your vagina dry. These include: ? Scented feminine sprays. ? Scented tampons. ? Scented soaps.  Vaginal intercourse can help to improve blood flow and elasticity of vaginal tissue. If it hurts to have sex, try using a lubricant or moisturizer just before having intercourse. Contact a health care provider if:    Your discharge looks different than normal.  Your vagina has an unusual smell.  You have new symptoms.  Your symptoms do not improve with treatment.  Your symptoms get worse. Summary  Atrophic vaginitis is a condition in which the tissues that  line the vagina become dry and thin. It is most common in women who have stopped having regular menstrual periods (are in menopause).  Treatment options include using vaginal lubricants and low-dose vaginal estrogen.  Contact a health care provider if your vagina has an unusual smell, or if your symptoms get worse or do not improve after treatment. This information is not intended to replace advice given to you by your health care provider. Make sure you discuss any questions you have with your health care provider. Document Revised: 09/29/2017 Document Reviewed: 07/13/2017 Elsevier Patient Education  2020 [employer] Inc.  

## 2020-03-19 NOTE — Telephone Encounter (Signed)
Patient is calling in regards to results received and has questions.

## 2020-03-19 NOTE — Telephone Encounter (Signed)
Spoke with pt. Pt seen in office today for abnormal urine odor. Pt states seeing urine Dipstick results on mychart with trace leukocytes and wanted to know if she needed treatment for UTI.   Pt advised to wait on Urine micro and culture results before knowing if needing treatment. Pt agreeable and will return call to pt with results and recommendations per Dr Quincy Simmonds. Pt verbalized understanding.   Routing to Dr Quincy Simmonds for review.  Encounter closed.

## 2020-03-20 LAB — URINALYSIS, MICROSCOPIC ONLY
Bacteria, UA: NONE SEEN
Casts: NONE SEEN /lpf

## 2020-03-20 LAB — VITAMIN D 25 HYDROXY (VIT D DEFICIENCY, FRACTURES): Vit D, 25-Hydroxy: 12.6 ng/mL — ABNORMAL LOW (ref 30.0–100.0)

## 2020-03-20 LAB — TSH: TSH: 1.71 u[IU]/mL (ref 0.450–4.500)

## 2020-03-20 LAB — FOLLICLE STIMULATING HORMONE: FSH: 3.1 m[IU]/mL

## 2020-03-20 LAB — ESTRADIOL: Estradiol: 30.1 pg/mL

## 2020-03-20 NOTE — Addendum Note (Signed)
Addended by: Yisroel Ramming, Dietrich Pates E on: 03/20/2020 01:22 PM   Modules accepted: Orders

## 2020-03-21 LAB — URINE CULTURE

## 2020-03-23 MED ORDER — SULFAMETHOXAZOLE-TRIMETHOPRIM 800-160 MG PO TABS: 1.0000 | ORAL_TABLET | Freq: Two times a day (BID) | ORAL | 0 refills | Status: DC

## 2020-03-23 NOTE — Addendum Note (Signed)
Addended by: Yisroel Ramming, Dietrich Pates E on: 03/23/2020 05:01 PM   Modules accepted: Orders

## 2020-03-24 ENCOUNTER — Other Ambulatory Visit: Payer: Self-pay | Admitting: *Deleted

## 2020-03-24 MED ORDER — VITAMIN D (ERGOCALCIFEROL) 1.25 MG (50000 UNIT) PO CAPS: 50000.0000 [IU] | ORAL_CAPSULE | ORAL | 0 refills | Status: DC

## 2020-06-08 ENCOUNTER — Other Ambulatory Visit: Payer: Self-pay | Admitting: Obstetrics and Gynecology

## 2020-06-08 DIAGNOSIS — R7989 Other specified abnormal findings of blood chemistry: Secondary | ICD-10-CM

## 2020-06-08 NOTE — Telephone Encounter (Signed)
Medication refill request: Vit D 50000 Iu Last AEX:  05/29/20 Next AEX: 07/02/19 Last MMG (if hormonal medication request): 10/09/19  Normal  Refill authorized: 24/0

## 2020-06-22 ENCOUNTER — Other Ambulatory Visit: Payer: Self-pay

## 2020-06-22 DIAGNOSIS — R7989 Other specified abnormal findings of blood chemistry: Secondary | ICD-10-CM

## 2020-06-22 NOTE — Telephone Encounter (Signed)
Medication refill request: Vitamin D2 Last AEX:  05/30/19 Next AEX: 07/01/20 Last MMG (if hormonal medication request): NA Refill authorized: 8/0

## 2020-06-24 MED ORDER — VITAMIN D (ERGOCALCIFEROL) 1.25 MG (50000 UNIT) PO CAPS: 50000.0000 [IU] | ORAL_CAPSULE | ORAL | 0 refills | Status: DC

## 2020-06-30 NOTE — Progress Notes (Signed)
44 y.o. G0P0000 Single African American female here for annual exam.    Feeling better off antidepressant medication.   Stopped her antiHTN medication also.  Had a UTI one month ago and went to urgent care for treatment.  Had a UTI here in May, 2021 also.   Noticing a creamy discharge in 2 weeks.  No itching or burning.  Used Monistat.   No change in partner.   No Covid vaccine.  Had Covid in January, 2021.  Does not have questions about this.   PCP: Betty Martinique, MD   Patient's last menstrual period was 10/17/2019.           Sexually active: Yes.    The current method of family planning is status post hysterectomy.    Exercising: No.  walking and running when available Smoker:  no  Health Maintenance: Pap:  03/20/19 Neg:Neg HR HPV, 02-22-16 Neg:Neg HR HPV, 03-08-13 Neg:Neg HR HPV History of abnormal Pap:  no  MMG: 10-09-19 Neg/density B/BiRads1 Colonoscopy:  n/a BMD:   n/a  Result  n/a TDaP: 10 years or more--Would like today Gardasil:   no HIV:Neg 2019 Hep C: Neg 2019 Screening Labs:  Today.   reports that she has never smoked. She has never used smokeless tobacco. She reports current alcohol use of about 1.0 - 2.0 standard drink of alcohol per week. She reports previous drug use.  Past Medical History:  Diagnosis Date  . Bronchitis 10/2013  . Depression   . History of COVID-19 11/25/2019  . Hypertension   . Low vitamin D level   . Right ovarian cyst 2015   Calcified cyst - possible dermoid.  Needs yearly ultrasound.    Past Surgical History:  Procedure Laterality Date  . CYSTOSCOPY N/A 10/21/2019   Procedure: CYSTOSCOPY;  Surgeon: Nunzio Cobbs, MD;  Location: [facility];  Service: Gynecology;  Laterality: N/A;  . INTRAUTERINE DEVICE (IUD) INSERTION  04/11/13   Mirena  . LAPAROSCOPIC LYSIS OF ADHESIONS N/A 10/21/2019   Procedure: LAPAROSCOPIC LYSIS OF ADHESIONS;  Surgeon: Nunzio Cobbs, MD;  Location: [facility];  Service: Gynecology;  Laterality: N/A;  . TOTAL LAPAROSCOPIC HYSTERECTOMY WITH SALPINGECTOMY Bilateral 10/21/2019   Procedure: TOTAL LAPAROSCOPIC HYSTERECTOMY WITH SALPINGECTOMY AND RIGHT OOPHORECTOMY; Surgeon: Nunzio Cobbs, MD;  Location: [facility];  Service: Gynecology;  Laterality: Bilateral;  Extended recovery bed needed    Current Outpatient Medications  Medication Sig Dispense Refill  . ADDERALL XR 5 MG 24 hr capsule Take 5 mg by mouth daily. (Patient not taking: Reported on 07/01/2020)     No current facility-administered medications for this visit.    Family History  Problem Relation Age of Onset  . Diabetes Mother   . Pancreatic cancer Paternal Aunt 2  . Breast cancer Paternal Aunt   . Diabetes Maternal Grandmother   . Diabetes Maternal Grandfather   . Cancer Maternal Grandfather   . Obesity Maternal Grandfather   . Breast cancer Paternal Grandmother 30       postmenopausal  . Cancer Paternal Grandmother   . Hypertension Father   . Early death Father   . Hyperlipidemia Father   . Kidney disease Father   . Depression Sister   . Hypertension Sister   . Heart attack Paternal Grandfather   . Kidney failure Other 55    Review of Systems  All other systems reviewed and are negative.   Exam:   BP  132/84   Pulse 68   Resp 16   Ht 5' 4.5" (1.638 m)   Wt 158 lb 9.6 oz (71.9 kg)   LMP 10/17/2019   BMI 26.80 kg/m     General appearance: alert, cooperative and appears stated age Head: normocephalic, without obvious abnormality, atraumatic Neck: no adenopathy, supple, symmetrical, trachea midline and thyroid normal to inspection and palpation Lungs: clear to auscultation bilaterally Breasts: normal appearance, no masses or tenderness, No nipple retraction or dimpling, No nipple discharge or bleeding, No axillary adenopathy Heart: regular rate and rhythm Abdomen: soft, non-tender; no masses, no organomegaly Extremities:  extremities normal, atraumatic, no cyanosis or edema Skin: skin color, texture, turgor normal. No rashes or lesions Lymph nodes: cervical, supraclavicular, and axillary nodes normal. Neurologic: grossly normal  Pelvic: External genitalia:  no lesions              No abnormal inguinal nodes palpated.              Urethra:  normal appearing urethra with no masses, tenderness or lesions              Bartholins and Skenes: normal                 Vagina: normal appearing vagina with normal color and large amount creamy white discharge, no lesions              Cervix: absent              Pap taken: No. Bimanual Exam:  Uterus:  absent              Adnexa: no mass, fullness, tenderness              Rectal exam: Yes.  .  Confirms.              Anus:  normal sphincter tone, no lesions  Chaperone was present for exam.  Assessment:   Well woman visit with normal exam. Status post laparoscopic hysterectomy, bilateral salpingectomy, and right oophorectomy.   Vaginal discharge. Depression. Off medication.  HTN.  Off medication. Vaginal atrophy.  Mild.  Hx Covid.  Hx low vit D.  Plan: Mammogram screening discussed. Self breast awareness reviewed. Pap and HR HPV as above. Guidelines for Calcium, Vitamin D, regular exercise program including cardiovascular and weight bearing exercise. Affirm. TDap.  Routine labs.  I recommend she contact her PCP and psychiatrist to inform then she has stopped her prescription medications for HTN and depression.  Fu annually and prn.  After visit summary provided.

## 2020-07-01 ENCOUNTER — Ambulatory Visit: Payer: 59 | Admitting: Obstetrics and Gynecology

## 2020-07-01 ENCOUNTER — Encounter: Payer: Self-pay | Admitting: Obstetrics and Gynecology

## 2020-07-01 ENCOUNTER — Other Ambulatory Visit: Payer: Self-pay

## 2020-07-01 VITALS — BP 132/84 | HR 68 | Resp 16 | Ht 64.5 in | Wt 158.6 lb

## 2020-07-01 DIAGNOSIS — N898 Other specified noninflammatory disorders of vagina: Secondary | ICD-10-CM | POA: Diagnosis not present

## 2020-07-01 DIAGNOSIS — E559 Vitamin D deficiency, unspecified: Secondary | ICD-10-CM

## 2020-07-01 DIAGNOSIS — Z23 Encounter for immunization: Secondary | ICD-10-CM | POA: Diagnosis not present

## 2020-07-01 DIAGNOSIS — Z01419 Encounter for gynecological examination (general) (routine) without abnormal findings: Secondary | ICD-10-CM

## 2020-07-01 NOTE — Patient Instructions (Signed)

## 2020-07-02 ENCOUNTER — Telehealth: Payer: Self-pay | Admitting: *Deleted

## 2020-07-02 LAB — VAGINITIS/VAGINOSIS, DNA PROBE
Candida Species: NEGATIVE
Gardnerella vaginalis: NEGATIVE
Trichomonas vaginosis: NEGATIVE

## 2020-07-02 LAB — LIPID PANEL
Chol/HDL Ratio: 2.8 ratio (ref 0.0–4.4)
Cholesterol, Total: 223 mg/dL — ABNORMAL HIGH (ref 100–199)
HDL: 79 mg/dL (ref >39)
LDL Chol Calc (NIH): 130 mg/dL — ABNORMAL HIGH (ref 0–99)
Triglycerides: 80 mg/dL (ref 0–149)
VLDL Cholesterol Cal: 14 mg/dL (ref 5–40)

## 2020-07-02 LAB — COMPREHENSIVE METABOLIC PANEL
ALT: 11 IU/L (ref 0–32)
AST: 15 IU/L (ref 0–40)
Albumin/Globulin Ratio: 1.6 (ref 1.2–2.2)
Albumin: 4.5 g/dL (ref 3.8–4.8)
Alkaline Phosphatase: 82 IU/L (ref 48–121)
BUN/Creatinine Ratio: 18 (ref 9–23)
BUN: 15 mg/dL (ref 6–24)
Bilirubin Total: 0.5 mg/dL (ref 0.0–1.2)
CO2: 26 mmol/L (ref 20–29)
Calcium: 9.5 mg/dL (ref 8.7–10.2)
Chloride: 101 mmol/L (ref 96–106)
Creatinine, Ser: 0.82 mg/dL (ref 0.57–1.00)
GFR calc Af Amer: 101 mL/min/{1.73_m2} (ref >59)
GFR calc non Af Amer: 88 mL/min/{1.73_m2} (ref >59)
Globulin, Total: 2.9 g/dL (ref 1.5–4.5)
Glucose: 94 mg/dL (ref 65–99)
Potassium: 4.1 mmol/L (ref 3.5–5.2)
Sodium: 139 mmol/L (ref 134–144)
Total Protein: 7.4 g/dL (ref 6.0–8.5)

## 2020-07-02 LAB — CBC
Hematocrit: 39.3 % (ref 34.0–46.6)
Hemoglobin: 13.1 g/dL (ref 11.1–15.9)
MCH: 30.9 pg (ref 26.6–33.0)
MCHC: 33.3 g/dL (ref 31.5–35.7)
MCV: 93 fL (ref 79–97)
Platelets: 342 10*3/uL (ref 150–450)
RBC: 4.24 x10E6/uL (ref 3.77–5.28)
RDW: 12.4 % (ref 11.7–15.4)
WBC: 5.1 10*3/uL (ref 3.4–10.8)

## 2020-07-02 LAB — VITAMIN D 25 HYDROXY (VIT D DEFICIENCY, FRACTURES): Vit D, 25-Hydroxy: 17.9 ng/mL — ABNORMAL LOW (ref 30.0–100.0)

## 2020-07-02 MED ORDER — VITAMIN D (ERGOCALCIFEROL) 1.25 MG (50000 UNIT) PO CAPS: 50000.0000 [IU] | ORAL_CAPSULE | ORAL | 0 refills | Status: DC

## 2020-07-02 NOTE — Telephone Encounter (Signed)
-----   Message from Nunzio Cobbs, MD sent at 07/02/2020  2:38 PM EDT ----- Please contact patient with results of her testing.  Her Affirm test is negative for vaginitis.   Her LDL cholesterol is slightly elevated, but her cholesterol ratios are really good.  Her blood chemistries and blood counts are normal.   Her vitamin D level is still low.  I recommend she continue with vitamin D 50,000 IU twice weekly for 3 more months.  Please schedule a lab visit for her to retest in 3 months.  I will place a future order.

## 2020-07-02 NOTE — Addendum Note (Signed)
Addended by: Yisroel Ramming, Dietrich Pates E on: 07/02/2020 02:39 PM   Modules accepted: Orders

## 2020-07-02 NOTE — Telephone Encounter (Signed)
Burnice Logan, RN  07/02/2020 2:48 PM EDT Back to Top    Left message to call Sharee Pimple, RN at [place].

## 2020-07-02 NOTE — Telephone Encounter (Signed)
Spoke with patient. Advised per [name].  Rx for Vit D to verified pharmacy, patient read back instructions. Lab appt scheduled for 10/13/20 at 8:45am.   Patient reports creamy vaginal d/c still present, any additional recommendations?  Advised vaginitis testing negative. Continue to monitor symptoms, return call to office for OV if new symptoms develop or symptoms do not resolve. I will update [name] and return call if any additional recommendations. Patient agreeable.   Routing to provider for final review. Patient is agreeable to disposition. Will close encounter.

## 2020-07-15 ENCOUNTER — Ambulatory Visit: Payer: 59 | Admitting: Obstetrics and Gynecology

## 2020-09-02 ENCOUNTER — Telehealth: Payer: Self-pay

## 2020-09-02 NOTE — Telephone Encounter (Signed)
AEX 07/01/20 Recurrent UTI hx  Spoke with pt. Pt states having high fever of 103-102, urinary odor and frequency. Pt denies any back or abd pain, vaginal discharge, bleeding, NVD. States only drinking water and cranberry juice. States had sx x 1 week. Had hormone injections last week at [facility] and wanted to know if from that. Denies any vaginal itching, urinary burning and no change in partner.  Advised pt with sx, to go and be seen at Urgent care tonight. Has not had Covid testing. Pt denies any exposure or sx other than fever. Had Covid in Jan 2021. Pt agreeable and verbalized understanding.   Routing to Dr Quincy Simmonds for review  Encounter closed

## 2020-09-02 NOTE — Telephone Encounter (Signed)
Patient is calling in regards to "running temperatures and having a strong odor in urine".

## 2020-10-13 ENCOUNTER — Other Ambulatory Visit: Payer: Self-pay

## 2020-10-13 ENCOUNTER — Ambulatory Visit: Payer: 59 | Admitting: Obstetrics and Gynecology

## 2020-10-13 ENCOUNTER — Encounter: Payer: Self-pay | Admitting: Obstetrics and Gynecology

## 2020-10-13 ENCOUNTER — Telehealth: Payer: Self-pay | Admitting: *Deleted

## 2020-10-13 ENCOUNTER — Other Ambulatory Visit (INDEPENDENT_AMBULATORY_CARE_PROVIDER_SITE_OTHER): Payer: 59

## 2020-10-13 VITALS — BP 124/82 | HR 64 | Ht 63.25 in | Wt 158.0 lb

## 2020-10-13 DIAGNOSIS — N898 Other specified noninflammatory disorders of vagina: Secondary | ICD-10-CM

## 2020-10-13 DIAGNOSIS — R7989 Other specified abnormal findings of blood chemistry: Secondary | ICD-10-CM

## 2020-10-13 DIAGNOSIS — N907 Vulvar cyst: Secondary | ICD-10-CM

## 2020-10-13 DIAGNOSIS — R8290 Unspecified abnormal findings in urine: Secondary | ICD-10-CM

## 2020-10-13 DIAGNOSIS — E559 Vitamin D deficiency, unspecified: Secondary | ICD-10-CM

## 2020-10-13 LAB — POCT URINALYSIS DIPSTICK
Bilirubin, UA: NEGATIVE
Blood, UA: NEGATIVE
Glucose, UA: NEGATIVE
Ketones, UA: NEGATIVE
Nitrite, UA: POSITIVE
Protein, UA: NEGATIVE
Urobilinogen, UA: NEGATIVE E.U./dL — AB
pH, UA: 5 (ref 5.0–8.0)

## 2020-10-13 NOTE — Telephone Encounter (Signed)
Patient in office for blood work today. States she has been experiencing foul urinary odor for one month now. Denies dysuria, urgency or frequency. Denies vaginal discharge. RN advised OV recommended for further evaluation. Patient agreeable. Patient scheduled for 0900 with [name].   Routing to provider and will close encounter.

## 2020-10-13 NOTE — Progress Notes (Signed)
GYNECOLOGY  VISIT   HPI: 44 y.o.   Single  African American  female   [place] with Patient's last menstrual period was 10/17/2019.   here for vaginal odor and odor with urinating. No dysuria.  No urgency.  No vaginal discharge or itching.  No OTC treatments.   No change in sexual partners.   Also has a bump on her labia for one week.   Having a recheck on her vitamin D level today also.  Vit D level was 17.9 on 07/01/20.  She is taking vit D 50,000 IU twice weekly.  Urine dip - 1+ WBCs and positive nitrites, pH 5.0.  GYNECOLOGIC HISTORY: Patient's last menstrual period was 10/17/2019. Contraception:  Hyst Menopausal hormone therapy: none Last mammogram: 10-09-19 Neg/density B/BiRads1 Last pap smear: 03/20/19 Neg:Neg HR HPV, 02-22-16 Neg:Neg HR HPV, 03-08-13 Neg:Neg HR HPV        OB History    Gravida  0   Para  0   Term  0   Preterm  0   AB  0   Living  0     SAB  0   IAB  0   Ectopic  0   Multiple  0   Live Births  0              Patient Active Problem List   Diagnosis Date Noted   Status post hysterectomy 10/21/2019   Status post laparoscopic hysterectomy 10/21/2019   Hypertension, essential, benign 09/20/2019   Cough, persistent 02/26/2019   Allergic rhinitis 02/26/2019   Ovarian mass, right 04/28/2018   GERD (gastroesophageal reflux disease) 04/02/2018   Circadian rhythm sleep disturbance 04/02/2018   Hyperlipidemia 04/02/2018   IUD (intrauterine device) in place 05/17/2013   Class 2 obesity with body mass index (BMI) of 35.0 to 35.9 in adult 03/08/2013    Past Medical History:  Diagnosis Date   Bronchitis 10/2013   Depression    History of COVID-19 11/25/2019   Hypertension    Low vitamin D level    Right ovarian cyst 2015   Calcified cyst - possible dermoid.  Needs yearly ultrasound.    Past Surgical History:  Procedure Laterality Date   CYSTOSCOPY N/A 10/21/2019   Procedure: CYSTOSCOPY;  Surgeon: Nunzio Cobbs, MD;  Location: [facility];  Service: Gynecology;  Laterality: N/A;   INTRAUTERINE DEVICE (IUD) INSERTION  04/11/13   Mirena   LAPAROSCOPIC LYSIS OF ADHESIONS N/A 10/21/2019   Procedure: LAPAROSCOPIC LYSIS OF ADHESIONS;  Surgeon: Nunzio Cobbs, MD;  Location: [facility];  Service: Gynecology;  Laterality: N/A;   TOTAL LAPAROSCOPIC HYSTERECTOMY WITH SALPINGECTOMY Bilateral 10/21/2019   Procedure: TOTAL LAPAROSCOPIC HYSTERECTOMY WITH SALPINGECTOMY AND RIGHT OOPHORECTOMY; Surgeon: Nunzio Cobbs, MD;  Location: [facility];  Service: Gynecology;  Laterality: Bilateral;  Extended recovery bed needed    Current Outpatient Medications  Medication Sig Dispense Refill   Vitamin D, Ergocalciferol, (DRISDOL) 1.25 MG (50000 UNIT) CAPS capsule Take 1 capsule (50,000 Units total) by mouth 2 (two) times a week. 24 capsule 0   ADDERALL XR 5 MG 24 hr capsule Take 5 mg by mouth daily. (Patient not taking: Reported on 07/01/2020)     No current facility-administered medications for this visit.     ALLERGIES: Patient has no known allergies.  Family History  Problem Relation Age of Onset   Diabetes Mother    Pancreatic cancer Paternal Aunt 62  Breast cancer Paternal Aunt    Diabetes Maternal Grandmother    Diabetes Maternal Grandfather    Cancer Maternal Grandfather    Obesity Maternal Grandfather    Breast cancer Paternal Grandmother 81       postmenopausal   Cancer Paternal Grandmother    Hypertension Father    Early death Father    Hyperlipidemia Father    Kidney disease Father    Depression Sister    Hypertension Sister    Heart attack Paternal Grandfather    Kidney failure Other 11    Social History   Socioeconomic History   Marital status: Single    Spouse name: Not on file   Number of children: 0   Years of education: Not on file   Highest education level: Not on file   Occupational History   Not on file  Tobacco Use   Smoking status: Never Smoker   Smokeless tobacco: Never Used  Vaping Use   Vaping Use: Never used  Substance and Sexual Activity   Alcohol use: Yes    Alcohol/week: 1.0 - 2.0 standard drink    Types: 1 - 2 Standard drinks or equivalent per week   Drug use: Not Currently   Sexual activity: Yes    Partners: Male  Other Topics Concern   Not on file  Social History Narrative   Not on file   Social Determinants of Health   Financial Resource Strain: Not on file  Food Insecurity: Not on file  Transportation Needs: Not on file  Physical Activity: Not on file  Stress: Not on file  Social Connections: Not on file  Intimate Partner Violence: Not on file    Review of Systems  Genitourinary:       Odor with  urinating  All other systems reviewed and are negative.   PHYSICAL EXAMINATION:    BP 124/82    Pulse 64    Ht 5' 3.25" (1.607 m)    Wt 158 lb (71.7 kg)    LMP 10/17/2019    SpO2 97%    BMI 27.77 kg/m     General appearance: alert, cooperative and appears stated age   Pelvic: External genitalia:  Small 3 mm cystic area of left labia minora.              Urethra:  normal appearing urethra with no masses, tenderness or lesions              Bartholins and Skenes: normal                 Vagina: normal appearing vagina with normal color and discharge, no lesions              Cervix: absent.                 Bimanual Exam:  Uterus:  absent              Adnexa: no mass, fullness, tenderness                 Chaperone was present for exam.  ASSESSMENT  Urinary odor.  Vaginal odor.  Low vit D.  Vulvar cyst.  Looks small and benign.  PLAN  Urine micro and cx.  No antibiotic at this time.  Will wait for final UC.  Affirm vaginitis testing.  Check vit D level today. Reassurance regarding her vulvar cyst.  She will contact me if it enlarges.    18 min  total time  was spent for this patient encounter, including  preparation, face-to-face counseling with the patient, coordination of care, and documentation of the encounter.

## 2020-10-14 LAB — URINALYSIS, MICROSCOPIC ONLY: Casts: NONE SEEN /lpf

## 2020-10-15 LAB — VAGINITIS/VAGINOSIS, DNA PROBE
Candida Species: NEGATIVE
Gardnerella vaginalis: POSITIVE — AB
Trichomonas vaginosis: NEGATIVE

## 2020-10-15 LAB — VITAMIN D 25 HYDROXY (VIT D DEFICIENCY, FRACTURES): Vit D, 25-Hydroxy: 14.6 ng/mL — ABNORMAL LOW (ref 30.0–100.0)

## 2020-10-16 ENCOUNTER — Other Ambulatory Visit: Payer: Self-pay | Admitting: *Deleted

## 2020-10-16 ENCOUNTER — Telehealth: Payer: Self-pay | Admitting: *Deleted

## 2020-10-16 MED ORDER — SULFAMETHOXAZOLE-TRIMETHOPRIM 800-160 MG PO TABS: 1.0000 | ORAL_TABLET | Freq: Two times a day (BID) | ORAL | 0 refills | Status: AC

## 2020-10-16 MED ORDER — METRONIDAZOLE 500 MG PO TABS: 500.0000 mg | ORAL_TABLET | Freq: Two times a day (BID) | ORAL | 0 refills | Status: DC

## 2020-10-16 NOTE — Telephone Encounter (Signed)
-----   Message from Nunzio Cobbs, MD sent at 10/16/2020 11:12 AM EST ----- Please contact patient with results of her vit D which continues to be low.  How is she taking her vitamin D?

## 2020-10-16 NOTE — Telephone Encounter (Signed)
Burnice Logan, RN  10/16/2020 1:39 PM EST Back to Top     Spoke with patient. Patient advised of all results as seen below per [name]. Patient request Rx for Bactrim and Flagyl to verified pharmacy. ETOH precautions reviewed.   Patient states she has Vit D Rx, does not take medication consistently. Patient states she will try to take on a regualr basis. Advised patient I will update [name] and f/u with any additional recommendations, patient agreeable.   See encounter dated 10/16/20 to review with provider.    [name] -please review and advise on Vit D and f/u.

## 2020-10-19 ENCOUNTER — Other Ambulatory Visit: Payer: Self-pay | Admitting: Obstetrics and Gynecology

## 2020-10-19 ENCOUNTER — Telehealth: Payer: Self-pay | Admitting: *Deleted

## 2020-10-19 DIAGNOSIS — R7989 Other specified abnormal findings of blood chemistry: Secondary | ICD-10-CM

## 2020-10-19 MED ORDER — VITAMIN D (ERGOCALCIFEROL) 1.25 MG (50000 UNIT) PO CAPS: 50000.0000 [IU] | ORAL_CAPSULE | ORAL | 0 refills | Status: AC

## 2020-10-19 NOTE — Telephone Encounter (Signed)
-----   Message from Nunzio Cobbs, MD sent at 10/19/2020  7:30 AM EST ----- Please contact patient back regarding her low vit D.  I recommend vit D 50,000 IU twice weekly for 4 months and then recheck a level.  Please make a follow up visit.  I will place a future order.

## 2020-10-19 NOTE — Telephone Encounter (Signed)
Spoke with pt. Pt given results and recommendations per Dr Quincy Simmonds. Pt verbalized understanding and is agreeable to Rx Vit D. Vit D Rx sent to pharmacy on file. #34, 0RF  Pt scheduled for lab visit. Pt agreeable to date and time of appt.  Encounter closed

## 2020-10-19 NOTE — Telephone Encounter (Signed)
Message left to return call to Ellarose Brandi at [phone].    

## 2020-10-20 NOTE — Telephone Encounter (Signed)
Please see result note 

## 2020-10-20 NOTE — Telephone Encounter (Signed)
See 10/13/20 result note. Patient notified of Vit D recommendations.   Encounter closed.

## 2020-10-22 LAB — URINE CULTURE

## 2021-01-10 ENCOUNTER — Other Ambulatory Visit: Payer: Self-pay | Admitting: Obstetrics and Gynecology

## 2021-02-01 ENCOUNTER — Other Ambulatory Visit: Payer: Self-pay

## 2021-02-02 ENCOUNTER — Other Ambulatory Visit: Payer: Self-pay

## 2021-02-02 ENCOUNTER — Other Ambulatory Visit (INDEPENDENT_AMBULATORY_CARE_PROVIDER_SITE_OTHER): Payer: 59

## 2021-02-02 DIAGNOSIS — R7989 Other specified abnormal findings of blood chemistry: Secondary | ICD-10-CM

## 2021-02-03 LAB — VITAMIN D 25 HYDROXY (VIT D DEFICIENCY, FRACTURES): Vit D, 25-Hydroxy: 41.1 ng/mL (ref 30.0–100.0)

## 2021-03-31 ENCOUNTER — Encounter: Payer: Self-pay | Admitting: Family Medicine

## 2021-03-31 ENCOUNTER — Other Ambulatory Visit: Payer: Self-pay

## 2021-03-31 ENCOUNTER — Ambulatory Visit: Payer: 59 | Admitting: Family Medicine

## 2021-03-31 VITALS — BP 128/80 | HR 52 | Temp 98.0°F | Resp 16 | Ht 63.25 in | Wt 164.1 lb

## 2021-03-31 DIAGNOSIS — R001 Bradycardia, unspecified: Secondary | ICD-10-CM

## 2021-03-31 DIAGNOSIS — B9689 Other specified bacterial agents as the cause of diseases classified elsewhere: Secondary | ICD-10-CM | POA: Diagnosis not present

## 2021-03-31 DIAGNOSIS — R8290 Unspecified abnormal findings in urine: Secondary | ICD-10-CM

## 2021-03-31 DIAGNOSIS — N760 Acute vaginitis: Secondary | ICD-10-CM

## 2021-03-31 LAB — POCT URINALYSIS DIPSTICK
Bilirubin, UA: NEGATIVE
Blood, UA: NEGATIVE
Glucose, UA: NEGATIVE
Ketones, UA: NEGATIVE
Nitrite, UA: POSITIVE
Protein, UA: POSITIVE — AB
Spec Grav, UA: 1.02 (ref 1.010–1.025)
Urobilinogen, UA: 1 E.U./dL
pH, UA: 6 (ref 5.0–8.0)

## 2021-03-31 NOTE — Patient Instructions (Addendum)
A few things to remember from today's visit:  Foul smelling urine - Plan: POCT urinalysis dipstick, Urine Culture  Bradycardia, sinus  If you need refills please call your pharmacy. Do not use My Chart to request refills or for acute issues that need immediate attention.   History does not suggest a urine tract infection. Increase fluid intake. We will follow urine culture. Please follow with gyn if problem is persistent, it could be a vaginal problem. Avoid vaginal douche.  Please be sure medication list is accurate. If a new problem present, please set up appointment sooner than planned today.  Monitor heart rate at home, little low today.

## 2021-03-31 NOTE — Progress Notes (Signed)
Chief Complaint  Patient presents with  . Urinary Tract Infection    Foul smelling urine x a few months, no burning or pain.   HPI:  Courtney Donaldson is a 45 y.o. female, who is here today complaining of odorous urine, concerned about possible UTI. She has had problem intermittently for a couple of months. Problem seems to be exacerbated by sex intercourse.  Dysuria: Negative. Urinary frequency: Negative. Urinary urgency: Negative. Incontinence: Negative. Gross hematuria: Negative. Urine is dark sometimes. She has not noted fever, chills, change in appetite, or unusual fatigue. Abdominal pain: Negative.  Nausea or vomiting: Negative. Reporting hx of BV. Abnormal vaginal bleeding or discharge: Denies. She has a gyn.  LMP:S/P hysterectomy. Sexual activity: Yes Hx of UTI: 09/2020 Ucx grew E Coli > 100,000 CFU.  OTC medications for this problem: None.  Noted mild bradycardia. Denies chest pain, dyspnea, palpitation,focal weakness, or edema.  Review of Systems  Constitutional: Negative for activity change and chills.  HENT: Negative for mouth sores and nosebleeds.   Eyes: Negative for redness and visual disturbance.  Respiratory: Negative for cough and wheezing.   Gastrointestinal:       Negative for changes in bowel habits.  Genitourinary: Negative for dyspareunia and genital sores.  Musculoskeletal: Negative for back pain, gait problem and myalgias.  Skin: Negative for pallor and rash.  Neurological: Negative for syncope and weakness.  Hematological: Negative for adenopathy. Does not bruise/bleed easily.  Rest see pertinent positives and negatives per HPI.  No current outpatient medications on file prior to visit.   No current facility-administered medications on file prior to visit.   Past Medical History:  Diagnosis Date  . Bronchitis 10/2013  . Depression   . History of COVID-19 11/25/2019  . Hypertension   . Low vitamin D level   . Right  ovarian cyst 2015   Calcified cyst - possible dermoid.  Needs yearly ultrasound.   No Known Allergies  Social History   Socioeconomic History  . Marital status: Single    Spouse name: Not on file  . Number of children: 0  . Years of education: Not on file  . Highest education level: Not on file  Occupational History  . Not on file  Tobacco Use  . Smoking status: Never Smoker  . Smokeless tobacco: Never Used  Vaping Use  . Vaping Use: Never used  Substance and Sexual Activity  . Alcohol use: Yes    Alcohol/week: 1.0 - 2.0 standard drink    Types: 1 - 2 Standard drinks or equivalent per week  . Drug use: Not Currently  . Sexual activity: Yes    Partners: Male  Other Topics Concern  . Not on file  Social History Narrative  . Not on file   Social Determinants of Health   Financial Resource Strain: Not on file  Food Insecurity: Not on file  Transportation Needs: Not on file  Physical Activity: Not on file  Stress: Not on file  Social Connections: Not on file   Vitals:   03/31/21 0759  BP: 128/80  Pulse: (!) 52  Resp: 16  Temp: 98 F (36.7 C)  SpO2: 99%   Body mass index is 28.84 kg/m.  Physical Exam Vitals and nursing note reviewed.  Constitutional:      General: She is not in acute distress.    Appearance: She is well-developed.  HENT:     Head: Normocephalic and atraumatic.     Mouth/Throat:  Mouth: Mucous membranes are moist.     Pharynx: Oropharynx is clear.  Eyes:     Conjunctiva/sclera: Conjunctivae normal.  Cardiovascular:     Rate and Rhythm: Regular rhythm. Bradycardia present.     Comments: HR: 56/min Pulmonary:     Effort: Pulmonary effort is normal. No respiratory distress.     Breath sounds: Normal breath sounds.  Abdominal:     Palpations: Abdomen is soft. There is no mass.     Tenderness: There is no abdominal tenderness.  Lymphadenopathy:     Cervical: No cervical adenopathy.  Skin:    General: Skin is warm.     Findings: No  erythema.  Neurological:     Mental Status: She is alert and oriented to person, place, and time.  Psychiatric:        Mood and Affect: Mood is anxious.        Speech: Speech normal.   ASSESSMENT AND PLAN:  [name] was seen today for urinary tract infection.  Diagnoses and all orders for this visit: Orders Placed This Encounter  Procedures  . Urine Culture  . POCT urinalysis dipstick    Foul smelling urine We discussed possible etiologies. Urine dipstick positive for protein (small) and leuk, rest negative.? Contamination. Hx does not suggest UTI. Monitor for new symptoms. Increase water intake. Ucx order, will follow and give recommendations accordingly.  Bradycardia, sinus Mild and asymptomatic. Taught how to check HR at home. No further testing recommended at this time.  BV (bacterial vaginosis) Hx of recurrent problem. Educated about Dx and risk factors. Avoid vaginal douches. Recommend continuing following with gyn. We could consider vaginal metronidazole if Ucx is negative and symptoms are still present.  Return if symptoms worsen or fail to improve.  Lyndel Dancel G. Martinique, MD  [facility]. [place] office.   A few things to remember from today's visit:  Foul smelling urine - Plan: POCT urinalysis dipstick, Urine Culture  Bradycardia, sinus  If you need refills please call your pharmacy. Do not use My Chart to request refills or for acute issues that need immediate attention.   History does not suggest a urine tract infection. Increase fluid intake. We will follow urine culture. Please follow with gyn if problem is persistent, it could be a vaginal problem. Avoid vaginal douche.  Please be sure medication list is accurate. If a new problem present, please set up appointment sooner than planned today.  Monitor heart rate at home, little low today.

## 2021-04-02 LAB — URINE CULTURE
MICRO NUMBER:: 11955910
SPECIMEN QUALITY:: ADEQUATE

## 2021-04-05 ENCOUNTER — Other Ambulatory Visit: Payer: Self-pay

## 2021-04-05 MED ORDER — NITROFURANTOIN MONOHYD MACRO 100 MG PO CAPS: 100.0000 mg | ORAL_CAPSULE | Freq: Two times a day (BID) | ORAL | 0 refills | Status: AC

## 2021-07-28 ENCOUNTER — Ambulatory Visit: Payer: 59 | Admitting: Obstetrics and Gynecology

## 2021-07-29 NOTE — Progress Notes (Signed)
45 y.o. G0P0000 Single African American female here for annual exam.    Notes a lump on her labia.    Hx low vit D.  Not taking maintenance dosing.   Occasional bladder infection related to sexual activity.  Notes dryness. Feels hot at night.  No hot flashes.   PCP:  Betty Martinique, MD   Patient's last menstrual period was 10/17/2019.           Sexually active: Yes.    The current method of family planning is status post hysterectomy.    Exercising: No.  The patient does not participate in regular exercise at present. Smoker:  no  Health Maintenance: Pap:  03/20/19 Neg:Neg HR HPV, 02-22-16 Neg:Neg HR HPV, 03-08-13 Neg:Neg HR HPV History of abnormal Pap:  no MMG:  10-09-19 Neg/BiRads1-- patient has information so she can schedule Colonoscopy:  n/a BMD:   n/a  Result  n/a TDaP: 07-01-20 Gardasil:   no HIV: 2019 Neg Hep C: 2019 Neg Screening Labs: today.   reports that she has never smoked. She has never used smokeless tobacco. She reports current alcohol use of about 1.0 - 2.0 standard drink per week. She reports that she does not currently use drugs.  Past Medical History:  Diagnosis Date   Bronchitis 10/2013   Depression    History of COVID-19 11/25/2019   Hypertension    Low vitamin D level    Right ovarian cyst 2015   Calcified cyst - possible dermoid.  Needs yearly ultrasound.    Past Surgical History:  Procedure Laterality Date   CYSTOSCOPY N/A 10/21/2019   Procedure: CYSTOSCOPY;  Surgeon: Nunzio Cobbs, MD;  Location: [facility];  Service: Gynecology;  Laterality: N/A;   INTRAUTERINE DEVICE (IUD) INSERTION  04/11/13   Mirena   LAPAROSCOPIC LYSIS OF ADHESIONS N/A 10/21/2019   Procedure: LAPAROSCOPIC LYSIS OF ADHESIONS;  Surgeon: Nunzio Cobbs, MD;  Location: [facility];  Service: Gynecology;  Laterality: N/A;   TOTAL LAPAROSCOPIC HYSTERECTOMY WITH SALPINGECTOMY Bilateral 10/21/2019   Procedure: TOTAL  LAPAROSCOPIC HYSTERECTOMY WITH SALPINGECTOMY AND RIGHT OOPHORECTOMY; Surgeon: Nunzio Cobbs, MD;  Location: [facility];  Service: Gynecology;  Laterality: Bilateral;  Extended recovery bed needed    Current Outpatient Medications  Medication Sig Dispense Refill   nitrofurantoin, macrocrystal-monohydrate, (MACROBID) 100 MG capsule Take one capsule by mouth with intercourse for prevention of bladder infection. 30 capsule 2   No current facility-administered medications for this visit.    Family History  Problem Relation Age of Onset   Diabetes Mother    Pancreatic cancer Paternal Aunt 56   Breast cancer Paternal Aunt    Diabetes Maternal Grandmother    Diabetes Maternal Grandfather    Cancer Maternal Grandfather    Obesity Maternal Grandfather    Breast cancer Paternal Grandmother 33       postmenopausal   Cancer Paternal Grandmother    Hypertension Father    Early death Father    Hyperlipidemia Father    Kidney disease Father    Depression Sister    Hypertension Sister    Heart attack Paternal Grandfather    Kidney failure Other 66    Review of Systems  All other systems reviewed and are negative.  Exam:   BP 132/80   Pulse 76   Ht 5' 4.5" (1.638 m)   Wt 165 lb (74.8 kg)   LMP 10/17/2019   SpO2 98%   BMI  27.88 kg/m     General appearance: alert, cooperative and appears stated age Head: normocephalic, without obvious abnormality, atraumatic Neck: no adenopathy, supple, symmetrical, trachea midline and thyroid normal to inspection and palpation Lungs: clear to auscultation bilaterally Breasts: normal appearance, no masses or tenderness, No nipple retraction or dimpling, No nipple discharge or bleeding, No axillary adenopathy Heart: regular rate and rhythm Abdomen: soft, non-tender; no masses, no organomegaly Extremities: extremities normal, atraumatic, no cyanosis or edema Skin: skin color, texture, turgor normal. No rashes or  lesions Lymph nodes: cervical, supraclavicular, and axillary nodes normal. Neurologic: grossly normal  Pelvic: External genitalia:  small sebaceous cyst of the left labia majora.               No abnormal inguinal nodes palpated.              Urethra:  normal appearing urethra with no masses, tenderness or lesions              Bartholins and Skenes: normal                 Vagina: normal appearing vagina with normal color and discharge, no lesions              Cervix: absent              Pap taken: no Bimanual Exam:  Uterus:  normal size, contour, position, consistency, mobility, non-tender              Adnexa: no mass, fullness, tenderness              Rectal exam: yes  Confirms.              Anus:  normal sphincter tone, no lesions  Chaperone was present for exam:  Estill Bamberg, CMA  Assessment:   Well woman visit with gynecologic exam. Status post laparoscopic hysterectomy, bilateral salpingectomy, and right oophorectomy.   Vaginal atrophy.  Mild.  Hx Covid.  Hx low vit D. Post coital UTI.  Plan: Mammogram screening discussed. Self breast awareness reviewed. Pap and HR HPV as above. Guidelines for Calcium, Vitamin D, regular exercise program including cardiovascular and weight bearing exercise. We discussed post coital abx and vaginal estrogen cream.   She will update her mammogram before any estrogen cream is prescribed.   Rx for Macrobid 100 mg with intercourse. #30, RF 2.  Routine labs.  Follow up annually and prn.    After visit summary provided.

## 2021-07-30 ENCOUNTER — Other Ambulatory Visit: Payer: Self-pay

## 2021-07-30 ENCOUNTER — Ambulatory Visit (INDEPENDENT_AMBULATORY_CARE_PROVIDER_SITE_OTHER): Payer: 59 | Admitting: Obstetrics and Gynecology

## 2021-07-30 ENCOUNTER — Encounter: Payer: Self-pay | Admitting: Obstetrics and Gynecology

## 2021-07-30 VITALS — BP 132/80 | HR 76 | Ht 64.5 in | Wt 165.0 lb

## 2021-07-30 DIAGNOSIS — Z87440 Personal history of urinary (tract) infections: Secondary | ICD-10-CM | POA: Diagnosis not present

## 2021-07-30 DIAGNOSIS — Z01419 Encounter for gynecological examination (general) (routine) without abnormal findings: Secondary | ICD-10-CM | POA: Diagnosis not present

## 2021-07-30 MED ORDER — NITROFURANTOIN MONOHYD MACRO 100 MG PO CAPS: ORAL_CAPSULE | ORAL | 2 refills | Status: DC

## 2021-07-30 NOTE — Patient Instructions (Signed)

## 2021-07-31 LAB — LIPID PANEL
Cholesterol: 209 mg/dL — ABNORMAL HIGH (ref <200)
HDL: 69 mg/dL (ref >=50)
LDL Cholesterol (Calc): 123 mg/dL (calc) — ABNORMAL HIGH
Non-HDL Cholesterol (Calc): 140 mg/dL (calc) — ABNORMAL HIGH (ref <130)
Total CHOL/HDL Ratio: 3 (calc) (ref <5.0)
Triglycerides: 76 mg/dL (ref <150)

## 2021-07-31 LAB — COMPREHENSIVE METABOLIC PANEL
AG Ratio: 1.4 (calc) (ref 1.0–2.5)
ALT: 9 U/L (ref 6–29)
AST: 11 U/L (ref 10–30)
Albumin: 4.2 g/dL (ref 3.6–5.1)
Alkaline phosphatase (APISO): 64 U/L (ref 31–125)
BUN: 15 mg/dL (ref 7–25)
CO2: 26 mmol/L (ref 20–32)
Calcium: 9.4 mg/dL (ref 8.6–10.2)
Chloride: 105 mmol/L (ref 98–110)
Creat: 0.81 mg/dL (ref 0.50–0.99)
Globulin: 3.1 g/dL (calc) (ref 1.9–3.7)
Glucose, Bld: 86 mg/dL (ref 65–99)
Potassium: 4.8 mmol/L (ref 3.5–5.3)
Sodium: 137 mmol/L (ref 135–146)
Total Bilirubin: 0.4 mg/dL (ref 0.2–1.2)
Total Protein: 7.3 g/dL (ref 6.1–8.1)

## 2021-07-31 LAB — CBC
HCT: 41.1 % (ref 35.0–45.0)
Hemoglobin: 13.2 g/dL (ref 11.7–15.5)
MCH: 29.7 pg (ref 27.0–33.0)
MCHC: 32.1 g/dL (ref 32.0–36.0)
MCV: 92.4 fL (ref 80.0–100.0)
MPV: 9.6 fL (ref 7.5–12.5)
Platelets: 300 10*3/uL (ref 140–400)
RBC: 4.45 10*6/uL (ref 3.80–5.10)
RDW: 11.7 % (ref 11.0–15.0)
WBC: 5.4 10*3/uL (ref 3.8–10.8)

## 2021-07-31 LAB — VITAMIN D 25 HYDROXY (VIT D DEFICIENCY, FRACTURES): Vit D, 25-Hydroxy: 21 ng/mL — ABNORMAL LOW (ref 30–100)

## 2021-09-27 NOTE — Progress Notes (Signed)
ACUTE VISIT Chief Complaint  Patient presents with   Knee Injury    Patients fell 2 sundays ago and fell on her left knee. This past Saturday she could feel the bone popping in and out and noticed it was very swollen.   HPI: Ms.Karelly Asiyah Pineau is a 45 y.o. female, who is here today complaining of left knee pain after work related injury as described above. On 09/12/21 she was at work, she was on a stool when she was taking some cases out of [employer], missed a step going down and landed on concrete floor on her left side. Knee pain started immediately after fall, next day edema,and "a little bit" of redness.  She reported event. Evaluated  by the nurse at the work, Ibuprofen and Tylenol were recommended.  Her knee has "pop in and out" x 2 since accident.  Achy like pain,intermittent, improved, today pain is 3/10. Max level of pain 10/10. + Ankle pain, no edema or erythema.  Exacerbated by walking. She does "factory work", a lots of walking and climbing.  She is also concerned about possible UTI. Denies dysuria,increased urinary frequency, gross hematuria,or decreased urine output.  +Odor for about 3 weeks, intermittently. No vaginal discharge. LMP s/p hysterectomy.  Review of Systems  Constitutional:  Negative for appetite change, fatigue and fever.  Respiratory:  Negative for cough, shortness of breath and wheezing.   Cardiovascular:  Negative for chest pain, palpitations and leg swelling.  Gastrointestinal:  Negative for abdominal pain, nausea and vomiting.       Negative for changes in bowel habits.  Skin:  Negative for pallor and rash.  Neurological:  Negative for syncope, weakness and numbness.  Rest see pertinent positives and negatives per HPI.  Current Outpatient Medications on File Prior to Visit  Medication Sig Dispense Refill   nitrofurantoin, macrocrystal-monohydrate, (MACROBID) 100 MG capsule Take one capsule by mouth with intercourse for prevention of  bladder infection. 30 capsule 2   No current facility-administered medications on file prior to visit.   Past Medical History:  Diagnosis Date   Bronchitis 10/2013   Depression    History of COVID-19 11/25/2019   June, 2022   Hypertension    Low vitamin D level    Right ovarian cyst 2015   Calcified cyst - possible dermoid.  Needs yearly ultrasound.   No Known Allergies  Social History   Socioeconomic History   Marital status: Single    Spouse name: Not on file   Number of children: 0   Years of education: Not on file   Highest education level: Not on file  Occupational History   Not on file  Tobacco Use   Smoking status: Never   Smokeless tobacco: Never  Vaping Use   Vaping Use: Never used  Substance and Sexual Activity   Alcohol use: Yes    Alcohol/week: 1.0 - 2.0 standard drink    Types: 1 - 2 Standard drinks or equivalent per week   Drug use: Not Currently   Sexual activity: Yes    Partners: Male  Other Topics Concern   Not on file  Social History Narrative   Not on file   Social Determinants of Health   Financial Resource Strain: Not on file  Food Insecurity: Not on file  Transportation Needs: Not on file  Physical Activity: Not on file  Stress: Not on file  Social Connections: Not on file   Vitals:   09/28/21 1026  BP: 132/80  Pulse: 65  Resp: 16  Temp: 98.5 F (36.9 C)  SpO2: 100%   Body mass index is 28.76 kg/m.  Physical Exam Vitals and nursing note reviewed.  Constitutional:      General: She is not in acute distress.    Appearance: She is well-developed. She is not ill-appearing.  HENT:     Head: Normocephalic and atraumatic.  Eyes:     Conjunctiva/sclera: Conjunctivae normal.  Cardiovascular:     Rate and Rhythm: Normal rate and regular rhythm.     Pulses:          Dorsalis pedis pulses are 2+ on the left side.       Posterior tibial pulses are 2+ on the left side.  Pulmonary:     Effort: Pulmonary effort is normal. No  respiratory distress.  Abdominal:     Palpations: Abdomen is soft. There is no mass.     Tenderness: There is no abdominal tenderness. There is no right CVA tenderness or left CVA tenderness.  Musculoskeletal:     Right knee: Effusion present. No erythema, bony tenderness or crepitus. Normal range of motion. Tenderness present over the lateral joint line. No patellar tendon tenderness. Normal meniscus and normal patellar mobility. Normal pulse.     Instability Tests: Anterior drawer test negative. Posterior drawer test negative. Medial McMurray test negative and lateral McMurray test negative.     Right ankle: Tenderness present over the lateral malleolus and ATF ligament. Normal range of motion.       Feet:  Skin:    General: Skin is warm.     Findings: No erythema or rash.  Neurological:     Mental Status: She is alert and oriented to person, place, and time.  Psychiatric:     Comments: Well groomed, good eye contact.   ASSESSMENT AND PLAN:  Ms.Gerri was seen today for knee injury.  Diagnoses and all orders for this visit: Orders Placed This Encounter  Procedures   DG Knee Complete 4 Views Left   Microalbumin / creatinine urine ratio   POCT urinalysis dipstick   Lab Results  Component Value Date   MICROALBUR 4.3 (H) 09/28/2021   Injury of left knee, initial encounter Examination do not suggest a serious process, she would like to hold on ortho/sport medicine referral. Topical Voltaren qid prn recommended. Avoid activities that can aggravate knee pain. Work accommodations x 2 weeks. Knee X ray ordered today.  -     diclofenac Sodium (VOLTAREN) 1 % GEL; Apply 2 g topically 4 (four) times daily.  Foul smelling urine Urine dipstick and hx do not suggest UTI. Recommend increasing water intake.  Proteinuria, unspecified type Possible etiologies discussed. No hx of renal disease. Further recommendations according to Microalb/Cr ratio.  Sprain of left ankle, unspecified  ligament, initial encounter Mild. I do not think imaging is needed at this time. ABC ROM exercises recommended.  Return if symptoms worsen or fail to improve.  Lakisa Lotz G. Martinique, MD  [facility]. [place] office.

## 2021-09-28 ENCOUNTER — Other Ambulatory Visit: Payer: Self-pay

## 2021-09-28 ENCOUNTER — Encounter: Payer: Self-pay | Admitting: Family Medicine

## 2021-09-28 ENCOUNTER — Ambulatory Visit (INDEPENDENT_AMBULATORY_CARE_PROVIDER_SITE_OTHER): Payer: Worker's Compensation

## 2021-09-28 ENCOUNTER — Ambulatory Visit (INDEPENDENT_AMBULATORY_CARE_PROVIDER_SITE_OTHER): Payer: Worker's Compensation | Admitting: Family Medicine

## 2021-09-28 VITALS — BP 132/80 | HR 65 | Temp 98.5°F | Resp 16 | Ht 64.5 in | Wt 170.2 lb

## 2021-09-28 DIAGNOSIS — R8290 Unspecified abnormal findings in urine: Secondary | ICD-10-CM

## 2021-09-28 DIAGNOSIS — S8992XA Unspecified injury of left lower leg, initial encounter: Secondary | ICD-10-CM | POA: Diagnosis not present

## 2021-09-28 DIAGNOSIS — S93402A Sprain of unspecified ligament of left ankle, initial encounter: Secondary | ICD-10-CM | POA: Diagnosis not present

## 2021-09-28 DIAGNOSIS — R809 Proteinuria, unspecified: Secondary | ICD-10-CM | POA: Diagnosis not present

## 2021-09-28 LAB — POCT URINALYSIS DIPSTICK
Bilirubin, UA: NEGATIVE
Blood, UA: NEGATIVE
Glucose, UA: NEGATIVE
Ketones, UA: NEGATIVE
Leukocytes, UA: NEGATIVE
Nitrite, UA: NEGATIVE
Protein, UA: POSITIVE — AB
Spec Grav, UA: 1.025 (ref 1.010–1.025)
Urobilinogen, UA: 1 E.U./dL
pH, UA: 6 (ref 5.0–8.0)

## 2021-09-28 LAB — MICROALBUMIN / CREATININE URINE RATIO
Creatinine,U: 228.3 mg/dL
Microalb Creat Ratio: 1.9 mg/g (ref 0.0–30.0)
Microalb, Ur: 4.3 mg/dL — ABNORMAL HIGH (ref 0.0–1.9)

## 2021-09-28 MED ORDER — DICLOFENAC SODIUM 1 % EX GEL: 2.0000 g | Freq: Four times a day (QID) | CUTANEOUS | 1 refills | Status: DC

## 2021-09-28 NOTE — Patient Instructions (Addendum)
A few things to remember from today's visit:   Foul smelling urine - Plan: POCT urinalysis dipstick  Proteinuria, unspecified type - Plan: Microalbumin / creatinine urine ratio, Microalbumin / creatinine urine ratio  Morbid obesity ([place]), Chronic  Injury of left knee, initial encounter - Plan: DG Knee Complete 4 Views Left, diclofenac Sodium (VOLTAREN) 1 % GEL  If you need refills please call your pharmacy. Do not use My Chart to request refills or for acute issues that need immediate attention.   Topical Voltaren for now.  Continue elevation a few times per day. For about 2 weeks avoid climbing stairs/ladders.  Increase water intake. Urine today negative for infection.  Please be sure medication list is accurate. If a new problem present, please set up appointment sooner than planned today.

## 2022-01-20 ENCOUNTER — Emergency Department (HOSPITAL_COMMUNITY): Payer: No Typology Code available for payment source

## 2022-01-20 ENCOUNTER — Encounter (HOSPITAL_COMMUNITY): Payer: Self-pay | Admitting: Emergency Medicine

## 2022-01-20 ENCOUNTER — Other Ambulatory Visit: Payer: Self-pay

## 2022-01-20 ENCOUNTER — Emergency Department (HOSPITAL_COMMUNITY)
Admission: EM | Admit: 2022-01-20 | Discharge: 2022-01-20 | Disposition: A | Discharge status: Home / Self Care | Payer: No Typology Code available for payment source | Attending: Emergency Medicine | Admitting: Emergency Medicine

## 2022-01-20 DIAGNOSIS — W268XXA Contact with other sharp object(s), not elsewhere classified, initial encounter: Secondary | ICD-10-CM | POA: Diagnosis not present

## 2022-01-20 DIAGNOSIS — F0781 Postconcussional syndrome: Secondary | ICD-10-CM

## 2022-01-20 DIAGNOSIS — R519 Headache, unspecified: Secondary | ICD-10-CM | POA: Diagnosis not present

## 2022-01-20 DIAGNOSIS — Y92481 Parking lot as the place of occurrence of the external cause: Secondary | ICD-10-CM | POA: Insufficient documentation

## 2022-01-20 NOTE — ED Provider Triage Note (Signed)
Emergency Medicine Provider Triage Evaluation Note ? ?Courtney Donaldson , a 46 y.o. female  was evaluated in triage.  Pt complains of an injury that occurred on 03/08.  Patient states that she was at work when a large charging cord hit her in the head causing a small laceration.  Patient believes that she passed out for 2 minutes.  States since that time she has been having headaches.  She also mentions a fever of 100.6 yesterday.  Temperature 99.3 here currently.  She was sent by [employer] for CT scan of her head.  She denies any blurry vision, double vision, nausea, vomiting, confusion.  She denies any redness/swelling around the wound, drainage of pus.  Did not come to the ER initially for evaluation at that time.  Denies any neck stiffness or rash. No recent sick contacts. ? ?Review of Systems  ?Positive: + headache, LOC ?Negative: - nausea, vomiting ? ?Physical Exam  ?BP (!) 159/118 (BP Location: Left Arm)   Pulse 71   Temp 99.3 ?F (37.4 ?C) (Oral)   Resp 14   Ht '5\' 5"'$  (1.651 m)   Wt 74.8 kg   LMP 10/17/2019   SpO2 99%   BMI 27.46 kg/m?  ?Gen:   Awake, no distress   ?Resp:  Normal effort  ?MSK:   Moves extremities without difficulty  ?Other:   ? ?Medical Decision Making  ?Medically screening exam initiated at 12:33 PM.  Appropriate orders placed.  Courtney Donaldson was informed that the remainder of the evaluation will be completed by another provider, this initial triage assessment does not replace that evaluation, and the importance of remaining in the ED until their evaluation is complete. ? ? ?  ?Eustaquio Maize, PA-C ?01/20/22 1235 ? ?

## 2022-01-20 NOTE — ED Provider Notes (Signed)
?[facility] ?Provider Note ? ? ?CSN: [id number] ?Arrival date & time: 01/20/22  1147 ? ?  ? ?History ? ?Chief Complaint  ?Patient presents with  ? Headache  ? ? ?Dreanna Makeya Hilgert is a 46 y.o. female here with headache. Patient states that she was at work on 3/6 and had head injury. Patient states that she was at work at that time and was trying to plug in a golf cart cord and the cord snapped and hit her on the forehead.  She had a laceration at that time and medic was called and wrapped up the wound.  Patient notified Worker's Comp. afterwards.  Patient states that she has persistent headaches for the last 3 weeks.  She also been more forgetful.  Yesterday she had a low-grade temperature of 100.6.  Denies any neck pain.  Denies any vomiting.  Patient was sent by Worker's [employer]. to get a CT head ? ?The history is provided by the patient.  ? ?  ? ?Home Medications ?Prior to Admission medications   ?Medication Sig Start Date End Date Taking? Authorizing Provider  ?diclofenac Sodium (VOLTAREN) 1 % GEL Apply 2 g topically 4 (four) times daily. 09/28/21   Martinique, Betty G, MD  ?nitrofurantoin, macrocrystal-monohydrate, (MACROBID) 100 MG capsule Take one capsule by mouth with intercourse for prevention of bladder infection. 07/30/21   Nunzio Cobbs, MD  ?   ? ?Allergies    ?Patient has no known allergies.   ? ?Review of Systems   ?Review of Systems  ?Neurological:  Positive for headaches.  ?All other systems reviewed and are negative. ? ?Physical Exam ?Updated Vital Signs ?BP (!) 159/118 (BP Location: Left Arm)   Pulse 71   Temp 99.3 ?F (37.4 ?C) (Oral)   Resp 14   Ht '5\' 5"'$  (1.651 m)   Wt 74.8 kg   LMP 10/17/2019   SpO2 99%   BMI 27.46 kg/m?  ?Physical Exam ?Vitals and nursing note reviewed.  ?HENT:  ?   Head: Normocephalic.  ?   Mouth/Throat:  ?   Mouth: Mucous membranes are moist.  ?Eyes:  ?   Extraocular Movements: Extraocular movements intact.  ?   Pupils:  Pupils are equal, round, and reactive to light.  ?Cardiovascular:  ?   Rate and Rhythm: Normal rate and regular rhythm.  ?Pulmonary:  ?   Effort: Pulmonary effort is normal.  ?   Breath sounds: Normal breath sounds.  ?Abdominal:  ?   General: Bowel sounds are normal.  ?   Palpations: Abdomen is soft.  ?Musculoskeletal:  ?   Cervical back: Normal range of motion and neck supple.  ?Skin: ?   General: Skin is warm.  ?Neurological:  ?   Mental Status: She is alert.  ?   Comments: No obvious scalp hematoma.  Patient has normal strength and sensation bilateral arms and legs.  Patient has normal gait  ?Psychiatric:     ?   Mood and Affect: Mood normal.     ?   Behavior: Behavior normal.  ? ? ?ED Results / Procedures / Treatments   ?Labs ?(all labs ordered are listed, but only abnormal results are displayed) ?Labs Reviewed - No data to display ? ?EKG ?None ? ?Radiology ?CT Head Wo Contrast ? ?Result Date: 01/20/2022 ?CLINICAL DATA:  Headache since March 6th common charging cord hit in forehead and caused her to pass out EXAM: CT HEAD WITHOUT CONTRAST TECHNIQUE: Contiguous axial images were  obtained from the base of the skull through the vertex without intravenous contrast. RADIATION DOSE REDUCTION: This exam was performed according to the departmental dose-optimization program which includes automated exposure control, adjustment of the mA and/or kV according to patient size and/or use of iterative reconstruction technique. COMPARISON:  None. FINDINGS: Brain: There is no evidence of acute intracranial hemorrhage, extra-axial fluid collection, or acute infarct. Parenchymal volume is normal. The ventricles are normal in size. Gray-white differentiation is preserved. There is no mass lesion. There is no mass effect or midline shift. Vascular: No hyperdense vessel or unexpected calcification. Skull: Normal. Negative for fracture or focal lesion. Sinuses/Orbits: There is trace mucosal thickening in the left frontal sinus. The  globes and orbits are unremarkable. Other: None. IMPRESSION: Unremarkable head CT. Electronically Signed   By: Valetta Mole M.D.   On: 01/20/2022 12:28   ? ?Procedures ?Procedures  ? ? ?Medications Ordered in ED ?Medications - No data to display ? ?ED Course/ Medical Decision Making/ A&P ?  ?                        ?Medical Decision Making ?Jessicaann Verdelle Valtierra is a 46 y.o. female here with head injury and persistent headaches.  Patient had head injury 3 weeks ago and has persistent headaches.  Patient has nonfocal neuro exam.  CT head showed no bleed.  I think likely postconcussive syndrome.  I recommend Tylenol and Motrin and follow-up with Worker's Comp and concussion clinic.  ?  ? ?Amount and/or Complexity of Data Reviewed ?Radiology: ordered and independent interpretation performed. Decision-making details documented in ED Course. ? ?Final Clinical Impression(s) / ED Diagnoses ?Final diagnoses:  ?None  ? ? ?Rx / DC Orders ?ED Discharge Orders   ? ? None  ? ?  ? ? ?  ?Drenda Freeze, MD ?01/20/22 1515 ? ?

## 2022-01-20 NOTE — ED Triage Notes (Signed)
Patient c/o headache since March 6th. Patient states she was parking a golf cart when she went to grab the charging cord and the prongs on the cord hit her in her forehead and caused her to fall and pass out. Pt was seen by Dr. Maylon Peppers and sent here for CT scan. States headache has been consistent since the injury.  ?

## 2022-01-20 NOTE — Discharge Instructions (Signed)
Take tylenol, motrin for headache  ? ?You likely have post concussive syndrome. You may need to follow up with concussion specialist  ? ?Return to ER if you have worse headache, vomiting, fever, neck pain  ?

## 2022-01-21 ENCOUNTER — Telehealth: Payer: Self-pay

## 2022-01-21 NOTE — Telephone Encounter (Signed)
Pt states that since it is a workers comp case she will be seen by who her employer wants her to see. Declines setting up appt with PCP for that reason. ?

## 2022-01-25 ENCOUNTER — Encounter: Payer: Self-pay | Admitting: Family Medicine

## 2022-01-25 ENCOUNTER — Ambulatory Visit: Payer: 59 | Admitting: Family Medicine

## 2022-01-25 VITALS — BP 158/108 | HR 62 | Temp 97.8°F | Wt 177.0 lb

## 2022-01-25 DIAGNOSIS — I10 Essential (primary) hypertension: Secondary | ICD-10-CM

## 2022-01-25 MED ORDER — LOSARTAN POTASSIUM-HCTZ 50-12.5 MG PO TABS: 1.0000 | ORAL_TABLET | Freq: Every day | ORAL | 5 refills | Status: DC

## 2022-01-25 NOTE — Progress Notes (Signed)
? ?Established Patient Office Visit ? ?Subjective:  ?Patient ID: Courtney Donaldson, female    DOB: [birth year]  Age: 46 y.o. MRN: [id number] ? ?CC: No chief complaint on file. ? ? ?HPI ? ?Courtney Donaldson presents for recent elevated blood pressures.  She has had multiple elevated readings recently with consistent readings over 408 systolic with several 144Y range and diastolics up to 185.  She has had some headaches and mild shortness of breath but O2 sats have been normal.  No chest pain.  She had recent head injury at work with transient loss of consciousness.  Recent CT head 3/23 showed no bleed.  She does have strong family history of hypertension in both parents.  has also had recent left knee issue and is looking at upcoming surgery and has been taking significant amount of ibuprofen related to that.  Does drink some alcohol but mostly just weekends. ? ?Previously treated for hypertension with Lotrel.  She apparently took herself off that.  She is willing to look at other blood pressure medication at this time. ? ?Past Medical History:  ?Diagnosis Date  ? Bronchitis 10/2013  ? Depression   ? History of COVID-19 11/25/2019  ? June, 2022  ? Hypertension   ? Low vitamin D level   ? Right ovarian cyst 2015  ? Calcified cyst - possible dermoid.  Needs yearly ultrasound.  ? ? ?Past Surgical History:  ?Procedure Laterality Date  ? CYSTOSCOPY N/A 10/21/2019  ? Procedure: CYSTOSCOPY;  Surgeon: Nunzio Cobbs, MD;  Location: [facility];  Service: Gynecology;  Laterality: N/A;  ? INTRAUTERINE DEVICE (IUD) INSERTION  04/11/13  ? Mirena  ? LAPAROSCOPIC LYSIS OF ADHESIONS N/A 10/21/2019  ? Procedure: LAPAROSCOPIC LYSIS OF ADHESIONS;  Surgeon: Nunzio Cobbs, MD;  [facility];  Service: Gynecology;  Laterality: N/A;  ? TOTAL LAPAROSCOPIC HYSTERECTOMY WITH SALPINGECTOMY Bilateral 10/21/2019  ? Procedure: TOTAL LAPAROSCOPIC HYSTERECTOMY WITH SALPINGECTOMY  AND RIGHT OOPHORECTOMY; Surgeon: Nunzio Cobbs, MD;  Location: [facility];  Service: Gynecology;  Laterality: Bilateral;  Extended recovery bed needed  ? ? ?Family History  ?Problem Relation Age of Onset  ? Diabetes Mother   ? Pancreatic cancer Paternal Aunt 63  ? Breast cancer Paternal Aunt   ? Diabetes Maternal Grandmother   ? Diabetes Maternal Grandfather   ? Cancer Maternal Grandfather   ? Obesity Maternal Grandfather   ? Breast cancer Paternal Grandmother 70  ?     postmenopausal  ? Cancer Paternal Grandmother   ? Hypertension Father   ? Early death Father   ? Hyperlipidemia Father   ? Kidney disease Father   ? Depression Sister   ? Hypertension Sister   ? Heart attack Paternal Grandfather   ? Kidney failure Other 55  ? ? ?Social History  ? ?Socioeconomic History  ? Marital status: Single  ?  Spouse name: Not on file  ? Number of children: 0  ? Years of education: Not on file  ? Highest education level: Not on file  ?Occupational History  ? Not on file  ?Tobacco Use  ? Smoking status: Never  ? Smokeless tobacco: Never  ?Vaping Use  ? Vaping Use: Never used  ?Substance and Sexual Activity  ? Alcohol use: Yes  ?  Alcohol/week: 1.0 - 2.0 standard drink  ?  Types: 1 - 2 Standard drinks or equivalent per week  ? Drug use: Not Currently  ?  Sexual activity: Yes  ?  Partners: Male  ?Other Topics Concern  ? Not on file  ?Social History Narrative  ? Not on file  ? ?Social Determinants of Health  ? ?Financial Resource Strain: Not on file  ?Food Insecurity: Not on file  ?Transportation Needs: Not on file  ?Physical Activity: Not on file  ?Stress: Not on file  ?Social Connections: Not on file  ?Intimate Partner Violence: Not on file  ? ? ?Outpatient Medications Prior to Visit  ?Medication Sig Dispense Refill  ? diclofenac Sodium (VOLTAREN) 1 % GEL Apply 2 g topically 4 (four) times daily. 150 g 1  ? nitrofurantoin, macrocrystal-monohydrate, (MACROBID) 100 MG capsule Take one capsule by mouth with  intercourse for prevention of bladder infection. 30 capsule 2  ? ?No facility-administered medications prior to visit.  ? ? ?No Known Allergies ? ?ROS ?Review of Systems  ?Constitutional:  Negative for fatigue.  ?Eyes:  Negative for visual disturbance.  ?Respiratory:  Negative for cough, chest tightness, shortness of breath and wheezing.   ?Cardiovascular:  Negative for chest pain, palpitations and leg swelling.  ?Neurological:  Positive for headaches. Negative for dizziness, seizures, syncope, weakness and light-headedness.  ?Psychiatric/Behavioral:  Negative for confusion.   ? ?  ?Objective:  ?  ?Physical Exam ?Constitutional:   ?   Appearance: She is well-developed.  ?Eyes:  ?   Pupils: Pupils are equal, round, and reactive to light.  ?Neck:  ?   Thyroid: No thyromegaly.  ?   Vascular: No JVD.  ?Cardiovascular:  ?   Rate and Rhythm: Normal rate and regular rhythm.  ?   Heart sounds:  ?  No gallop.  ?Pulmonary:  ?   Effort: Pulmonary effort is normal. No respiratory distress.  ?   Breath sounds: Normal breath sounds. No wheezing or rales.  ?Musculoskeletal:  ?   Cervical back: Neck supple.  ?Neurological:  ?   General: No focal deficit present.  ?   Mental Status: She is alert.  ?   Cranial Nerves: No cranial nerve deficit.  ? ? ?BP (!) 158/108 (BP Location: Left Arm, Cuff Size: Normal)   Pulse 62   Temp 97.8 ?F (36.6 ?C) (Oral)   Wt 177 lb (80.3 kg)   LMP 10/17/2019   SpO2 99%   BMI 29.45 kg/m?  ?Wt Readings from Last 3 Encounters:  ?01/25/22 177 lb (80.3 kg)  ?01/20/22 165 lb (74.8 kg)  ?09/28/21 170 lb 3.2 oz (77.2 kg)  ? ? ? ?Health Maintenance Due  ?Topic Date Due  ? COVID-19 Vaccine (1) Never done  ? PAP SMEAR-Modifier  03/20/2022  ? ? ?There are no preventive care reminders to display for this patient. ? ?Lab Results  ?Component Value Date  ? TSH 1.710 03/19/2020  ? ?Lab Results  ?Component Value Date  ? WBC 5.4 07/30/2021  ? HGB 13.2 07/30/2021  ? HCT 41.1 07/30/2021  ? MCV 92.4 07/30/2021  ? PLT 300  07/30/2021  ? ?Lab Results  ?Component Value Date  ? NA 137 07/30/2021  ? K 4.8 07/30/2021  ? CO2 26 07/30/2021  ? GLUCOSE 86 07/30/2021  ? BUN 15 07/30/2021  ? CREATININE 0.81 07/30/2021  ? BILITOT 0.4 07/30/2021  ? ALKPHOS 82 07/01/2020  ? AST 11 07/30/2021  ? ALT 9 07/30/2021  ? PROT 7.3 07/30/2021  ? ALBUMIN 4.5 07/01/2020  ? CALCIUM 9.4 07/30/2021  ? ANIONGAP 8 10/17/2019  ? GFR 86.24 10/01/2019  ? ?Lab Results  ?Component Value Date  ?  CHOL 209 (H) 07/30/2021  ? ?Lab Results  ?Component Value Date  ? HDL 69 07/30/2021  ? ?Lab Results  ?Component Value Date  ? LDLCALC 123 (H) 07/30/2021  ? ?Lab Results  ?Component Value Date  ? TRIG 76 07/30/2021  ? ?Lab Results  ?Component Value Date  ? CHOLHDL 3.0 07/30/2021  ? ?Lab Results  ?Component Value Date  ? HGBA1C 5.1 10/28/2019  ? ? ?  ?Assessment & Plan:  ? ?Problem List Items Addressed This Visit   ? ?  ? Unprioritized  ? Hypertension, essential, benign - Primary  ? Relevant Medications  ? losartan-hydrochlorothiazide (HYZAAR) 50-12.5 MG tablet  ?Patient has history of hypertension.  Currently not treated.  Multiple elevated home readings and up here today as well.  Repeat left arm seated after rest 158/108. ? ?-Handout on DASH diet given ?-Work on weight loss ?-Try to keep daily sodium intake less than 2500 mg ?-Start losartan/HCTZ 50/12.5 mg 1 daily ?-Recommend 2-week follow-up with primary.  Consider labs at that point including basic metabolic panel ?-Try to avoid nonsteroidals as much as possible ? ?Meds ordered this encounter  ?Medications  ? losartan-hydrochlorothiazide (HYZAAR) 50-12.5 MG tablet  ?  Sig: Take 1 tablet by mouth daily.  ?  Dispense:  30 tablet  ?  Refill:  5  ? ? ?Follow-up: Return in about 2 weeks (around 02/08/2022).  ? ? ?Carolann Littler, MD ?

## 2022-02-07 NOTE — Progress Notes (Deleted)
? ? ?  HPI: ? ?Ms.Taelyr Tranisha Tissue is a 46 y.o. female, who is here today to follow on recent visit. ? ?Review of Systems ?Rest see pertinent positives and negatives per HPI. ? ?Current Outpatient Medications on File Prior to Visit  ?Medication Sig Dispense Refill  ? losartan-hydrochlorothiazide (HYZAAR) 50-12.5 MG tablet Take 1 tablet by mouth daily. 30 tablet 5  ? ?No current facility-administered medications on file prior to visit.  ? ? ?Past Medical History:  ?Diagnosis Date  ? Bronchitis 10/2013  ? Depression   ? History of COVID-19 11/25/2019  ? June, 2022  ? Hypertension   ? Low vitamin D level   ? Right ovarian cyst 2015  ? Calcified cyst - possible dermoid.  Needs yearly ultrasound.  ? ?No Known Allergies ? ?Social History  ? ?Socioeconomic History  ? Marital status: Single  ?  Spouse name: Not on file  ? Number of children: 0  ? Years of education: Not on file  ? Highest education level: Not on file  ?Occupational History  ? Not on file  ?Tobacco Use  ? Smoking status: Never  ? Smokeless tobacco: Never  ?Vaping Use  ? Vaping Use: Never used  ?Substance and Sexual Activity  ? Alcohol use: Yes  ?  Alcohol/week: 1.0 - 2.0 standard drink  ?  Types: 1 - 2 Standard drinks or equivalent per week  ? Drug use: Not Currently  ? Sexual activity: Yes  ?  Partners: Male  ?Other Topics Concern  ? Not on file  ?Social History Narrative  ? Not on file  ? ?Social Determinants of Health  ? ?Financial Resource Strain: Not on file  ?Food Insecurity: Not on file  ?Transportation Needs: Not on file  ?Physical Activity: Not on file  ?Stress: Not on file  ?Social Connections: Not on file  ? ? ?There were no vitals filed for this visit. ?There is no height or weight on file to calculate BMI. ? ?Physical Exam ? ?ASSESSMENT AND PLAN: ? ? ?There are no diagnoses linked to this encounter. ? ?No orders of the defined types were placed in this encounter. ? ? ?No problem-specific Assessment & Plan notes found for this  encounter. ? ? ?No follow-ups on file. ? ? ?Betty G. Martinique, MD ? ?[place]. ?[place] office. ? ? ? ? ? ? ? ? ? ? ? ? ? ? ? ?

## 2022-02-09 ENCOUNTER — Ambulatory Visit: Payer: 59 | Admitting: Family Medicine

## 2022-02-09 NOTE — Progress Notes (Signed)
?Chief Complaint  ?Patient presents with  ? surgery clearance  ? Follow-up  ?  Blood pressure  ? Shortness of Breath  ? ?HPI: ?[name] is a 46 y.o. female with history of hypertension, hyperlipidemia, and GERD here today for surgical clearance requested by Dr. Stann Mainland. ?I last saw her on 09/28/21. ?She is planning on having left knee scar with ACL reconstruction, allograft and partial medial meniscectomy. ?Work related injury in 08/2021. ?Date of surgery has not been determined. ?Hysterectomy in 10/2019, negative for complications during or after procedure. ?Negative for tobacco use, DM, CAD,CKD,or CHF. ?She cannot exercise because left knee pain but no CP,lightheadedness, or diaphoresis when walking a hill or carrying heavy groceries. ? ?Today she has other complaints today. ? 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 She was at work trying to plug in a golf cart, the cord snapped and hit her on the forehead, Superficial laceration has healed. ?On 01/25/22 she was seen here in the offcie for HTN. ?Still having frontal headache, daily, can be a severe as 10/10 but most of the time is 6/10. ?Pressure like headache. ? ?No associated symptoms. ?Taking Tylenol and Ibuprofen daily. ?Head CT  01/20/22 was negative. ?While she was in [place] she was in the ED for headache on 01/23/22. BP was elevated and she was lightheaded at this time. ?Local ice pack seems to help. ?No new medications recommended. ? ?SOB since 01/22/22 while she was in [place] and LE edema, 01/22/22. ?It happens at rest, noted while driving to [place]. ?LE edema has resolved. ?SOB has improved. ? ?States that lately she has been more irritable at work. ?Considering starting CBT. ? ?Hypertension has been well controlled on losartan-HCTZ 50-12.5 mg daily. ?She is checking BP at home and still elevated: 132/97,133/103,107/90,115/96,124/94,174/114 (02/04/22), and yesterday 164/101. ? ?Negative for visual  changes, focal neurologic deficit, gross hematuria, foam in urine, or erythema. ? ?Lab Results  ?Component Value Date  ? CREATININE 0.81 07/30/2021  ? BUN 15 07/30/2021  ? NA 137 07/30/2021  ? K 4.8 07/30/2021  ? CL 105 07/30/2021  ? CO2 26 07/30/2021  ? ?Cough , non productive since she started Losartan. ?No wheezing. ?When she is trying to catch her breath she has chest tightness, like "strengthen of my heart." Not sure about duration, it is not radiated, and not associated palpitations or diaphoresis. ?She has had cough before, listed on problem list,states that problem had resolved. ?Negative for heartburn. ? ?Hx of bronchitis. She has been using Symbicort 80-4.5 mcg bid prn, it seems to help with SOB. ? ?Review of Systems  ?Constitutional:  Positive for fatigue. Negative for activity change, appetite change and fever.  ?HENT:  Negative for mouth sores, nosebleeds and trouble swallowing.   ?Respiratory:  Positive for cough and shortness of breath. Negative for wheezing.   ?Cardiovascular:  Negative for chest pain, palpitations and leg swelling.  ?Gastrointestinal:  Negative for abdominal pain, nausea and vomiting.  ?     Negative for changes in bowel habits.  ?Endocrine: Negative for cold intolerance and heat intolerance.  ?Genitourinary:  Negative for decreased urine volume, dysuria and hematuria.  ?Neurological:  Positive for headaches. Negative for syncope and weakness.  ?Psychiatric/Behavioral:  Negative for confusion. The patient is nervous/anxious.   ?Rest see pertinent positives and negatives per HPI. ? ?No current outpatient medications on file prior to visit.  ? ?No current facility-administered medications on file prior to visit.  ? ?Past Medical  History:  ?Diagnosis Date  ? Bronchitis 10/2013  ? Depression   ? History of COVID-19 11/25/2019  ? June, 2022  ? Hypertension   ? Low vitamin D level   ? Right ovarian cyst 2015  ? Calcified cyst - possible dermoid.  Needs yearly ultrasound.  ? ?No Known  Allergies ? ?Social History  ? ?Socioeconomic History  ? Marital status: Single  ?  Spouse name: Not on file  ? Number of children: 0  ? Years of education: Not on file  ? Highest education level: Associate degree: occupational, Hotel manager, or vocational program  ?Occupational History  ? Not on file  ?Tobacco Use  ? Smoking status: Never  ? Smokeless tobacco: Never  ?Vaping Use  ? Vaping Use: Never used  ?Substance and Sexual Activity  ? Alcohol use: Yes  ?  Alcohol/week: 1.0 - 2.0 standard drink  ?  Types: 1 - 2 Standard drinks or equivalent per week  ? Drug use: Not Currently  ? Sexual activity: Yes  ?  Partners: Male  ?Other Topics Concern  ? Not on file  ?Social History Narrative  ? Not on file  ? ?Social Determinants of Health  ? ?Financial Resource Strain: Low Risk   ? Difficulty of Paying Living Expenses: Not hard at all  ?Food Insecurity: No Food Insecurity  ? Worried About Charity fundraiser in the Last Year: Never true  ? Ran Out of Food in the Last Year: Never true  ?Transportation Needs: No Transportation Needs  ? Lack of Transportation (Medical): No  ? Lack of Transportation (Non-Medical): No  ?Physical Activity: Sufficiently Active  ? Days of Exercise per Week: 3 days  ? Minutes of Exercise per Session: 60 min  ?Stress: No Stress Concern Present  ? Feeling of Stress : Not at all  ?Social Connections: Moderately Integrated  ? Frequency of Communication with Friends and Family: More than three times a week  ? Frequency of Social Gatherings with Friends and Family: More than three times a week  ? Attends Religious Services: More than 4 times per year  ? Active Member of Clubs or Organizations: Yes  ? Attends Archivist Meetings: More than 4 times per year  ? Marital Status: Never married  ? ?Vitals:  ? 02/11/22 0657  ?BP: 134/80  ?Pulse: 76  ?Resp: 16  ?SpO2: 98%  ? ?Body mass index is 30.2 kg/m?. ? ?Physical Exam ?Vitals and nursing note reviewed.  ?Constitutional:   ?   General: She is not in  acute distress. ?   Appearance: She is well-developed.  ?HENT:  ?   Head: Normocephalic and atraumatic.  ?   Nose:  ?   Right Sinus: No frontal sinus tenderness.  ?   Left Sinus: No frontal sinus tenderness.  ?   Mouth/Throat:  ?   Mouth: Mucous membranes are moist.  ?   Pharynx: Oropharynx is clear.  ?Eyes:  ?   Conjunctiva/sclera: Conjunctivae normal.  ?Neck:  ?   Vascular: No JVD.  ?Cardiovascular:  ?   Rate and Rhythm: Normal rate and regular rhythm.  ?   Pulses:     ?     Dorsalis pedis pulses are 2+ on the right side and 2+ on the left side.  ?   Heart sounds: No murmur heard. ?   Comments: No calves tenderness with palpation. ?Pulmonary:  ?   Effort: Pulmonary effort is normal. Prolonged expiration present. No respiratory distress.  ?  Breath sounds: Normal breath sounds.  ?   Comments: Intermittent non productive cough during visit. ?Abdominal:  ?   Palpations: Abdomen is soft. There is no hepatomegaly or mass.  ?   Tenderness: There is no abdominal tenderness.  ?Musculoskeletal:  ?   Right lower leg: No edema.  ?   Left lower leg: No edema.  ?     Legs: ? ?Lymphadenopathy:  ?   Cervical: No cervical adenopathy.  ?Skin: ?   General: Skin is warm.  ?   Findings: No erythema or rash.  ?Neurological:  ?   General: No focal deficit present.  ?   Mental Status: She is alert and oriented to person, place, and time.  ?   Cranial Nerves: No cranial nerve deficit.  ?   Gait: Gait normal.  ?   Comments: Mildly antalgic gait, not assisted.  ?Psychiatric:     ?   Mood and Affect: Mood is anxious.  ?   Comments: Well groomed, good eye contact.  ? ?ASSESSMENT AND PLAN: ? ?[name] was seen today for surgery clearance, follow-up and shortness of breath. ? ?Diagnoses and all orders for this visit: ?Orders Placed This Encounter  ?Procedures  ? DG Chest 2 View  ? CBC with Differential/Platelets  ? Basic Metabolic Panel  ? D-dimer, Quantitative  ? Ambulatory referral to Cardiology  ? EKG 12-Lead  ? ?Lab Results  ?Component  Value Date  ? DDIMER <0.19 02/11/2022  ? ?Lab Results  ?Component Value Date  ? CREATININE 0.84 02/11/2022  ? BUN 19 02/11/2022  ? NA 138 02/11/2022  ? K 3.9 02/11/2022  ? CL 104 02/11/2022  ? CO2 28 02/11/2022

## 2022-02-11 ENCOUNTER — Ambulatory Visit (INDEPENDENT_AMBULATORY_CARE_PROVIDER_SITE_OTHER): Payer: 59

## 2022-02-11 ENCOUNTER — Encounter: Payer: Self-pay | Admitting: Family Medicine

## 2022-02-11 ENCOUNTER — Ambulatory Visit: Payer: 59 | Admitting: Family Medicine

## 2022-02-11 VITALS — BP 134/80 | HR 76 | Resp 16 | Ht 65.0 in | Wt 181.5 lb

## 2022-02-11 DIAGNOSIS — R053 Chronic cough: Secondary | ICD-10-CM

## 2022-02-11 DIAGNOSIS — Z01818 Encounter for other preprocedural examination: Secondary | ICD-10-CM

## 2022-02-11 DIAGNOSIS — R0602 Shortness of breath: Secondary | ICD-10-CM | POA: Diagnosis not present

## 2022-02-11 DIAGNOSIS — I10 Essential (primary) hypertension: Secondary | ICD-10-CM | POA: Diagnosis not present

## 2022-02-11 DIAGNOSIS — R0789 Other chest pain: Secondary | ICD-10-CM | POA: Diagnosis not present

## 2022-02-11 DIAGNOSIS — G44309 Post-traumatic headache, unspecified, not intractable: Secondary | ICD-10-CM

## 2022-02-11 LAB — CBC WITH DIFFERENTIAL/PLATELET
Absolute Monocytes: 408 cells/uL (ref 200–950)
Basophils Absolute: 29 cells/uL (ref 0–200)
Basophils Relative: 0.6 %
Eosinophils Absolute: 72 cells/uL (ref 15–500)
Eosinophils Relative: 1.5 %
HCT: 38.4 % (ref 35.0–45.0)
Hemoglobin: 12.4 g/dL (ref 11.7–15.5)
Lymphs Abs: 1109 cells/uL (ref 850–3900)
MCH: 30.2 pg (ref 27.0–33.0)
MCHC: 32.3 g/dL (ref 32.0–36.0)
MCV: 93.7 fL (ref 80.0–100.0)
MPV: 9.5 fL (ref 7.5–12.5)
Monocytes Relative: 8.5 %
Neutro Abs: 3182 cells/uL (ref 1500–7800)
Neutrophils Relative %: 66.3 %
Platelets: 294 10*3/uL (ref 140–400)
RBC: 4.1 10*6/uL (ref 3.80–5.10)
RDW: 11.8 % (ref 11.0–15.0)
Total Lymphocyte: 23.1 %
WBC: 4.8 10*3/uL (ref 3.8–10.8)

## 2022-02-11 LAB — BASIC METABOLIC PANEL WITH GFR
BUN: 19 mg/dL (ref 7–25)
CO2: 28 mmol/L (ref 20–32)
Calcium: 9.5 mg/dL (ref 8.6–10.2)
Chloride: 104 mmol/L (ref 98–110)
Creat: 0.84 mg/dL (ref 0.50–0.99)
Glucose, Bld: 96 mg/dL (ref 65–99)
Potassium: 3.9 mmol/L (ref 3.5–5.3)
Sodium: 138 mmol/L (ref 135–146)

## 2022-02-11 LAB — D-DIMER, QUANTITATIVE: D-Dimer, Quant: <0.19 mcg/mL FEU (ref <0.50)

## 2022-02-11 MED ORDER — HYDROCHLOROTHIAZIDE 25 MG PO TABS: 25.0000 mg | ORAL_TABLET | Freq: Every day | ORAL | 1 refills | Status: DC

## 2022-02-11 MED ORDER — ALBUTEROL SULFATE HFA 108 (90 BASE) MCG/ACT IN AERS: 2.0000 | INHALATION_SPRAY | Freq: Four times a day (QID) | RESPIRATORY_TRACT | 0 refills | Status: DC | PRN

## 2022-02-11 MED ORDER — AMLODIPINE BESYLATE 5 MG PO TABS: 5.0000 mg | ORAL_TABLET | Freq: Every day | ORAL | 1 refills | Status: DC

## 2022-02-11 NOTE — Patient Instructions (Addendum)
A few things to remember from today's visit: ? ? ?Pre-operative clearance - Plan: EKG 12-Lead ? ?Shortness of breath - Plan: D-dimer, Quantitative, DG Chest 2 View ? ?Hypertension, essential, benign - Plan: Basic metabolic panel, CBC, amLODipine (NORVASC) 5 MG tablet, hydrochlorothiazide (HYDRODIURIL) 25 MG tablet ? ?Persistent cough - Plan: DG Chest 2 View ? ?If you need refills please call your pharmacy. ?Do not use My Chart to request refills or for acute issues that need immediate attention. ?  ?If labs are negative we will arrange appt with cardiologist for cardiac clearance. ?If shortness of breath gets worse, you need to go to the ER. ?Headache may last a few weeks. ?Continue monitoring blood pressure. ? ?If D dimer elevated, you will be sent to ER for chest CT. ? ?Please be sure medication list is accurate. ?If a new problem present, please set up appointment sooner than planned today. ? ?DASH Eating Plan ?DASH stands for Dietary Approaches to Stop Hypertension. The DASH eating plan is a healthy eating plan that has been shown to: ?Reduce high blood pressure (hypertension). ?Reduce your risk for type 2 diabetes, heart disease, and stroke. ?Help with weight loss. ?What are tips for following this plan? ?Reading food labels ?Check food labels for the amount of salt (sodium) per serving. Choose foods with less than 5 percent of the Daily Value of sodium. Generally, foods with less than 300 milligrams (mg) of sodium per serving fit into this eating plan. ?To find whole grains, look for the word "whole" as the first word in the ingredient list. ?Shopping ?Buy products labeled as "low-sodium" or "no salt added." ?Buy fresh foods. Avoid canned foods and pre-made or frozen meals. ?Cooking ?Avoid adding salt when cooking. Use salt-free seasonings or herbs instead of table salt or sea salt. Check with your health care provider or pharmacist before using salt substitutes. ?Do not fry foods. Cook foods using healthy  methods such as baking, boiling, grilling, roasting, and broiling instead. ?Cook with heart-healthy oils, such as olive, canola, avocado, soybean, or sunflower oil. ?Meal planning ? ?Eat a balanced diet that includes: ?4 or more servings of fruits and 4 or more servings of vegetables each day. Try to fill one-half of your plate with fruits and vegetables. ?6-8 servings of whole grains each day. ?Less than 6 oz (170 g) of lean meat, poultry, or fish each day. A 3-oz (85-g) serving of meat is about the same size as a deck of cards. One egg equals 1 oz (28 g). ?2-3 servings of low-fat dairy each day. One serving is 1 cup (237 mL). ?1 serving of nuts, seeds, or beans 5 times each week. ?2-3 servings of heart-healthy fats. Healthy fats called omega-3 fatty acids are found in foods such as walnuts, flaxseeds, fortified milks, and eggs. These fats are also found in cold-water fish, such as sardines, salmon, and mackerel. ?Limit how much you eat of: ?Canned or prepackaged foods. ?Food that is high in trans fat, such as some fried foods. ?Food that is high in saturated fat, such as fatty meat. ?Desserts and other sweets, sugary drinks, and other foods with added sugar. ?Full-fat dairy products. ?Do not salt foods before eating. ?Do not eat more than 4 egg yolks a week. ?Try to eat at least 2 vegetarian meals a week. ?Eat more home-cooked food and less restaurant, buffet, and fast food. ?Lifestyle ?When eating at a restaurant, ask that your food be prepared with less salt or no salt, if possible. ?If you  drink alcohol: ?Limit how much you use to: ?0-1 drink a day for women who are not pregnant. ?0-2 drinks a day for men. ?Be aware of how much alcohol is in your drink. In the [place] one drink equals one 12 oz bottle of beer (355 mL), one 5 oz glass of wine (148 mL), or one 1? oz glass of hard liquor (44 mL). ?General information ?Avoid eating more than 2,300 mg of salt a day. If you have hypertension, you may need to reduce  your sodium intake to 1,500 mg a day. ?Work with your health care provider to maintain a healthy body weight or to lose weight. Ask what an ideal weight is for you. ?Get at least 30 minutes of exercise that causes your heart to beat faster (aerobic exercise) most days of the week. Activities may include walking, swimming, or biking. ?Work with your health care provider or dietitian to adjust your eating plan to your individual calorie needs. ?What foods should I eat? ?Fruits ?All fresh, dried, or frozen fruit. Canned fruit in natural juice (without added sugar). ?Vegetables ?Fresh or frozen vegetables (raw, steamed, roasted, or grilled). Low-sodium or reduced-sodium tomato and vegetable juice. Low-sodium or reduced-sodium tomato sauce and tomato paste. Low-sodium or reduced-sodium canned vegetables. ?Grains ?Whole-grain or whole-wheat bread. Whole-grain or whole-wheat pasta. Brown rice. Modena Morrow. Bulgur. Whole-grain and low-sodium cereals. Pita bread. Low-fat, low-sodium crackers. Whole-wheat flour tortillas. ?Meats and other proteins ?Skinless chicken or [place]. Ground chicken or [place]. Pork with fat trimmed off. Fish and seafood. Egg whites. Dried beans, peas, or lentils. Unsalted nuts, nut butters, and seeds. Unsalted canned beans. Lean cuts of beef with fat trimmed off. Low-sodium, lean precooked or cured meat, such as sausages or meat loaves. ?Dairy ?Low-fat (1%) or fat-free (skim) milk. Reduced-fat, low-fat, or fat-free cheeses. Nonfat, low-sodium ricotta or cottage cheese. Low-fat or nonfat yogurt. Low-fat, low-sodium cheese. ?Fats and oils ?Soft margarine without trans fats. Vegetable oil. Reduced-fat, low-fat, or light mayonnaise and salad dressings (reduced-sodium). Canola, safflower, olive, avocado, soybean, and sunflower oils. Avocado. ?Seasonings and condiments ?Herbs. Spices. Seasoning mixes without salt. ?Other foods ?Unsalted popcorn and pretzels. Fat-free sweets. ?The items listed above may  not be a complete list of foods and beverages you can eat. Contact a dietitian for more information. ?What foods should I avoid? ?Fruits ?Canned fruit in a light or heavy syrup. Fried fruit. Fruit in cream or butter sauce. ?Vegetables ?Creamed or fried vegetables. Vegetables in a cheese sauce. Regular canned vegetables (not low-sodium or reduced-sodium). Regular canned tomato sauce and paste (not low-sodium or reduced-sodium). Regular tomato and vegetable juice (not low-sodium or reduced-sodium). Angie Fava. Olives. ?Grains ?Baked goods made with fat, such as croissants, muffins, or some breads. Dry pasta or rice meal packs. ?Meats and other proteins ?Fatty cuts of meat. Ribs. Fried meat. Berniece Salines. Bologna, salami, and other precooked or cured meats, such as sausages or meat loaves. Fat from the back of a pig (fatback). Bratwurst. Salted nuts and seeds. Canned beans with added salt. Canned or smoked fish. Whole eggs or egg yolks. Chicken or [place] with skin. ?Dairy ?Whole or 2% milk, cream, and half-and-half. Whole or full-fat cream cheese. Whole-fat or sweetened yogurt. Full-fat cheese. Nondairy creamers. Whipped toppings. Processed cheese and cheese spreads. ?Fats and oils ?Butter. Stick margarine. Lard. Shortening. Ghee. Bacon fat. Tropical oils, such as coconut, palm kernel, or palm oil. ?Seasonings and condiments ?Onion salt, garlic salt, seasoned salt, table salt, and sea salt. Worcestershire sauce. Tartar sauce. Barbecue sauce. Teriyaki sauce.  Soy sauce, including reduced-sodium. Steak sauce. Canned and packaged gravies. Fish sauce. Oyster sauce. Cocktail sauce. Store-bought horseradish. Ketchup. Mustard. Meat flavorings and tenderizers. Bouillon cubes. Hot sauces. Pre-made or packaged marinades. Pre-made or packaged taco seasonings. Relishes. Regular salad dressings. ?Other foods ?Salted popcorn and pretzels. ?The items listed above may not be a complete list of foods and beverages you should avoid. Contact a  dietitian for more information. ?Where to find more information ?National Heart, Lung, and Blood Institute: https://wilson-eaton.com/ ?American Heart Association: www.heart.org ?Academy of Nutrition and Dietetics: www.

## 2022-02-11 NOTE — Assessment & Plan Note (Addendum)
?  BP today adequately controlled, some home BP readings elevated. ?She thinks Losartan is causing cough, so discontinued. ?Amlodipine 5 mg started today, some side effects discussed. ?HCTZ increased from 12.5 mg to 25 mg daily. ?Low salt/DASH diet recommended. ?Continue monitoring BP at home. ?Instructed about warning signs. ?F/U in 4 weeks, before if needed. ?

## 2022-02-11 NOTE — Assessment & Plan Note (Addendum)
States that problem resolved and started back a couple days after Losartan was started. ?We discussed other possible etiologies. ? ?Using Symbicort 80-4.5 mcg, continue 2 puff bid for now. Albuterol inh 2 puff every 6 hours for a week then as needed for wheezing or shortness of breath.  ?I do not think imaging is needed today. ? ?

## 2022-02-17 ENCOUNTER — Telehealth: Payer: Self-pay | Admitting: *Deleted

## 2022-02-17 NOTE — Telephone Encounter (Addendum)
   Pre-operative Risk Assessment    Patient Name: Courtney Donaldson  DOB: 1976/05/23 MRN: [id number]   03/25/22: Our office received a duplicate request yesterday. I had the pre op provider today Courtney Memos, Courtney Donaldson to review if the Dr. Tamala Donaldson had cleared the pt when he saw her as a NEW PT 03/07/22. Per Pre op provider, Dr. Tamala Donaldson did not clear the pt yet. Pt was ordered cardiac testing (echo), this has been completed 03/23/22. Per the echo there medication changes made by Dr. Tamala Donaldson. He has a follow up with the pt on 04/26/22 for further review. Once the pt has been cleared our office will be sure to update all parties involved.     PT HAS NEW PT APPT WITH DR. Tamala Donaldson 03/07/22  Request for Surgical Clearance    Procedure:   LEFT KNEE SCOPE WITH ACL RECONSTRUCTION  Date of Surgery:  Clearance TBD                                 Surgeon:  Courtney Donaldson Surgeon's Group or Practice Name:  [facility] Phone number:  [phone] Fax number:  [phone] ATTN: KERRI MAZE   Type of Clearance Requested:   - Medical    Type of Anesthesia:   CHOICE   Additional requests/questions:    Courtney Donaldson   02/17/2022, 1:14 PM

## 2022-03-04 NOTE — Progress Notes (Signed)
? ?HPI: ?Courtney Donaldson is a 46 y.o. female, who is here today for follow up. ?She was seen on 02/11/22 for pre op clearance, at the time she also had a few concerns. ?Because of SOB she was referred to cardiologist for cardiac clearance before scheduling left knee surgery. ? ?She was c/o cough, which she was attributing to Losartan, so it was discontinued. ?CXR done last visit was negative for acute cardiopulmonary disease. ?She was started on Amlodipine 5 mg daily. ?She is also on HCTZ 25 mg daily. ?Home BP readings still mildly elevated: 147/96, 152/92, 140/91, 138/99, 157/97, 147/92, 145/100, and 139/105. ?DBP 116x1. ? ?States that she was under a lot of stress later last months when she was staying with her sister, she is doing better since she moved to a different place. ?She denies anxiety or depression. ? ?She is still having headache, which started after trauma at work, 01/03/22. ?Frontal pressure daily, constant headache. ?Most of the time 6/10 but he can be 10/10. ?Head CT on 01/20/2022 was negative. 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 ?She denies associated nausea, vomiting, or photophobia. ?Intensity and frequency have not changed but she is concerned because problem is not better. ?She would like to see neurologist. ? ?Lab Results  ?Component Value Date  ? CREATININE 0.84 02/11/2022  ? BUN 19 02/11/2022  ? NA 138 02/11/2022  ? K 3.9 02/11/2022  ? CL 104 02/11/2022  ? CO2 28 02/11/2022  ? ?Still having SOB, exacerbated by exertion, mainly when going up stairs.  No associated CP or palpitations. ?Last visit she was reporting some chest discomfort. ? ?Sometimes she has some wheezing. ?Cough has not improved, it is intermittent all day. ?Albuterol inh seems to help. ?Asthma during childhood. ?No hx of tobacco use. ?Nasal congestion, rhinorrhea, and postnasal drainage. ?Eye pruritus and conjunctival redness. ?She has not noted purulent discharge. ?She uses Flonase nasal  spray daily as needed. ?Negative for heartburn., ? ?Review of Systems  ?Constitutional:  Positive for fatigue. Negative for activity change, appetite change and fever.  ?HENT:  Negative for mouth sores, nosebleeds and sore throat.   ?Eyes:  Positive for redness and itching.  ?Gastrointestinal:  Negative for abdominal pain.  ?     Negative for changes in bowel habits.  ?Genitourinary:  Negative for decreased urine volume and hematuria.  ?Musculoskeletal:  Positive for arthralgias.  ?Skin:  Negative for rash.  ?Neurological:  Negative for syncope, facial asymmetry and weakness.  ?Psychiatric/Behavioral:  Negative for confusion. The patient is nervous/anxious.   ?Rest see pertinent positives and negatives per HPI. ? ?Current Outpatient Medications on File Prior to Visit  ?Medication Sig Dispense Refill  ? albuterol (VENTOLIN HFA) 108 (90 Base) MCG/ACT inhaler Inhale 2 puffs into the lungs every 6 (six) hours as needed for wheezing or shortness of breath. 8 g 0  ? amLODipine (NORVASC) 5 MG tablet Take 1 tablet (5 mg total) by mouth daily. 30 tablet 1  ? hydrochlorothiazide (HYDRODIURIL) 25 MG tablet Take 1 tablet (25 mg total) by mouth daily. 30 tablet 1  ? ?No current facility-administered medications on file prior to visit.  ? ?Past Medical History:  ?Diagnosis Date  ? Bronchitis 10/2013  ? Depression   ? History of COVID-19 11/25/2019  ? June, 2022  ? Hypertension   ? Low vitamin D level   ? Right ovarian cyst 2015  ? Calcified cyst - possible dermoid.  Needs yearly ultrasound.  ? ?No Known Allergies ? ?  Social History  ? ?Socioeconomic History  ? Marital status: Single  ?  Spouse name: Not on file  ? Number of children: 0  ? Years of education: Not on file  ? Highest education level: Associate degree: occupational, Hotel manager, or vocational program  ?Occupational History  ? Not on file  ?Tobacco Use  ? Smoking status: Never  ? Smokeless tobacco: Never  ?Vaping Use  ? Vaping Use: Never used  ?Substance and Sexual  Activity  ? Alcohol use: Yes  ?  Alcohol/week: 1.0 - 2.0 standard drink  ?  Types: 1 - 2 Standard drinks or equivalent per week  ? Drug use: Not Currently  ? Sexual activity: Yes  ?  Partners: Male  ?Other Topics Concern  ? Not on file  ?Social History Narrative  ? Not on file  ? ?Social Determinants of Health  ? ?Financial Resource Strain: Low Risk   ? Difficulty of Paying Living Expenses: Not hard at all  ?Food Insecurity: No Food Insecurity  ? Worried About Charity fundraiser in the Last Year: Never true  ? Ran Out of Food in the Last Year: Never true  ?Transportation Needs: No Transportation Needs  ? Lack of Transportation (Medical): No  ? Lack of Transportation (Non-Medical): No  ?Physical Activity: Sufficiently Active  ? Days of Exercise per Week: 3 days  ? Minutes of Exercise per Session: 60 min  ?Stress: No Stress Concern Present  ? Feeling of Stress : Not at all  ?Social Connections: Moderately Integrated  ? Frequency of Communication with Friends and Family: More than three times a week  ? Frequency of Social Gatherings with Friends and Family: More than three times a week  ? Attends Religious Services: More than 4 times per year  ? Active Member of Clubs or Organizations: Yes  ? Attends Archivist Meetings: More than 4 times per year  ? Marital Status: Never married  ? ?Vitals:  ? 03/07/22 0731  ?BP: 120/84  ?Pulse: 66  ?Resp: 16  ?SpO2: 97%  ? ?Body mass index is 30.64 kg/m?. ? ?Physical Exam ?Vitals and nursing note reviewed.  ?Constitutional:   ?   General: She is not in acute distress. ?   Appearance: She is well-developed.  ?HENT:  ?   Head: Normocephalic and atraumatic.  ?   Mouth/Throat:  ?   Mouth: Mucous membranes are moist.  ?   Pharynx: Oropharynx is clear.  ?Eyes:  ?   Conjunctiva/sclera: Conjunctivae normal.  ?Cardiovascular:  ?   Rate and Rhythm: Normal rate and regular rhythm.  ?   Pulses:     ?     Dorsalis pedis pulses are 2+ on the right side and 2+ on the left side.  ?    Heart sounds: No murmur heard. ?Pulmonary:  ?   Effort: Pulmonary effort is normal. No respiratory distress.  ?   Breath sounds: Normal breath sounds.  ?Abdominal:  ?   Palpations: Abdomen is soft. There is no hepatomegaly or mass.  ?   Tenderness: There is no abdominal tenderness.  ?Lymphadenopathy:  ?   Cervical: No cervical adenopathy.  ?Skin: ?   General: Skin is warm.  ?   Findings: No erythema or rash.  ?Neurological:  ?   General: No focal deficit present.  ?   Mental Status: She is alert and oriented to person, place, and time.  ?   Cranial Nerves: No cranial nerve deficit.  ?   Comments:  Antalgic gait, not assisted.  ?Psychiatric:     ?   Mood and Affect: Mood is anxious.  ?   Comments: Well groomed, good eye contact.  ? ?ASSESSMENT AND PLAN: ? ?Ms.Crimson was seen today for follow-up. ? ?Diagnoses and all orders for this visit: ? ?Headache, unspecified headache type ?It seems to be post traumatic, no prior hx. ?Problem is stable. ?Seasonal allergies and tension can also be triggering factors. ?Neurology referral placed as requested. ?Instructed about warning signs. ? ?SOB (shortness of breath) ?Stable. ??  Deconditioning, allergy rhinitis, and asthma among some to consider. ?She has an appointment with cardiologist today. ? ?Hypertension, essential, benign ?Today BP adequately controlled, she has had some elevated readings at home. ?Re-checked 130/90. ?After discussion of other pharmacologic options and side effects, she agrees with resuming Losartan lower dose, 25 mg daily. ?Continue Amlodipine 5 mg ,take it at bedtime. ?No changes in HCTZ. ?Continue monitoring BP regularly and following low salt diet. ?F/U in 2 months. ? ? ?Allergic rhinitis ?Continue Flonase nasal spray. ?Recommended adding Zyrtec 10 mg daily. ? ?Reactive airway disease ?Most likely causing her DOE. ?Symbicort 1560-4.5 mcg bid added today. ?Continue Albuterol inh 2 puff qid prn. ?Wt loss will help. ?F/U in 2 months. ? ?I spent a total of  41 minutes in both face to face and non face to face activities for this visit on the date of this encounter. During this time history was obtained and documented, examination was performed, prior labs/imaging rev
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
## 2022-03-07 ENCOUNTER — Ambulatory Visit: Payer: 59 | Admitting: Family Medicine

## 2022-03-07 ENCOUNTER — Ambulatory Visit: Payer: 59 | Admitting: Interventional Cardiology

## 2022-03-07 ENCOUNTER — Encounter: Payer: Self-pay | Admitting: Interventional Cardiology

## 2022-03-07 ENCOUNTER — Encounter: Payer: Self-pay | Admitting: Family Medicine

## 2022-03-07 VITALS — BP 120/84 | HR 66 | Resp 16 | Ht 65.0 in | Wt 184.1 lb

## 2022-03-07 VITALS — BP 130/100 | HR 63 | Ht 65.0 in | Wt 184.0 lb

## 2022-03-07 DIAGNOSIS — K219 Gastro-esophageal reflux disease without esophagitis: Secondary | ICD-10-CM | POA: Diagnosis not present

## 2022-03-07 DIAGNOSIS — R0789 Other chest pain: Secondary | ICD-10-CM | POA: Diagnosis not present

## 2022-03-07 DIAGNOSIS — I10 Essential (primary) hypertension: Secondary | ICD-10-CM

## 2022-03-07 DIAGNOSIS — R0602 Shortness of breath: Secondary | ICD-10-CM

## 2022-03-07 DIAGNOSIS — E6609 Other obesity due to excess calories: Secondary | ICD-10-CM

## 2022-03-07 DIAGNOSIS — E785 Hyperlipidemia, unspecified: Secondary | ICD-10-CM

## 2022-03-07 DIAGNOSIS — J45909 Unspecified asthma, uncomplicated: Secondary | ICD-10-CM | POA: Insufficient documentation

## 2022-03-07 DIAGNOSIS — J3089 Other allergic rhinitis: Secondary | ICD-10-CM

## 2022-03-07 DIAGNOSIS — R519 Headache, unspecified: Secondary | ICD-10-CM

## 2022-03-07 DIAGNOSIS — J4520 Mild intermittent asthma, uncomplicated: Secondary | ICD-10-CM

## 2022-03-07 DIAGNOSIS — Z6835 Body mass index (BMI) 35.0-35.9, adult: Secondary | ICD-10-CM

## 2022-03-07 MED ORDER — BUDESONIDE-FORMOTEROL FUMARATE 160-4.5 MCG/ACT IN AERO: 2.0000 | INHALATION_SPRAY | Freq: Two times a day (BID) | RESPIRATORY_TRACT | 2 refills | Status: DC

## 2022-03-07 MED ORDER — LOSARTAN POTASSIUM 25 MG PO TABS: 25.0000 mg | ORAL_TABLET | Freq: Every day | ORAL | 0 refills | Status: DC

## 2022-03-07 NOTE — Patient Instructions (Signed)
A few things to remember from today's visit: ? ? ?Hypertension, essential, benign - Plan: losartan (COZAAR) 25 MG tablet ? ?Headache, unspecified headache type - Plan: Ambulatory referral to Neurology ? ?SOB (shortness of breath) ? ?Mild intermittent reactive airway disease without complication - Plan: budesonide-formoterol (SYMBICORT) 160-4.5 MCG/ACT inhaler ? ?If you need refills please call your pharmacy. ?Do not use My Chart to request refills or for acute issues that need immediate attention. ?  ?Keep appt with cardiologist. ?Shortness of breath can be asthma related, so today we are adding Symbicort, rinse mouth after use. ?No changes in Albuterol inh. ?Zyrtec 10 mg daily. ?Losartan added back today, take it in the morning with hydrochlorothiazide. ?Take Amlodipine at bedtime. ?Continue monitoring blood pressure. ?  ?Please be sure medication list is accurate. ?If a new problem present, please set up appointment sooner than planned today. ? ? ? ? ? ? ? ?

## 2022-03-07 NOTE — Assessment & Plan Note (Signed)
Continue Flonase nasal spray. ?Recommended adding Zyrtec 10 mg daily. ?

## 2022-03-07 NOTE — Assessment & Plan Note (Signed)
Today BP adequately controlled, she has had some elevated readings at home. ?Re-checked 130/90. ?After discussion of other pharmacologic options and side effects, she agrees with resuming Losartan lower dose, 25 mg daily. ?Continue Amlodipine 5 mg ,take it at bedtime. ?No changes in HCTZ. ?Continue monitoring BP regularly and following low salt diet. ?F/U in 2 months. ? ?
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
## 2022-03-07 NOTE — Patient Instructions (Signed)
Medication Instructions:  ?Your physician recommends that you continue on your current medications as directed. Please refer to the Current Medication list given to you today. ? ?*If you need a refill on your cardiac medications before your next appointment, please call your pharmacy* ? ?Lab Work: ?NONE ? ?Testing/Procedures: ?Your physician has requested that you have an echocardiogram. Echocardiography is a painless test that uses sound waves to create images of your heart. It provides your doctor with information about the size and shape of your heart and how well your heart?s chambers and valves are working. This procedure takes approximately one hour. There are no restrictions for this procedure.  ? ?Follow-Up: ?At [facility], you and your health needs are our priority.  As part of our continuing mission to provide you with exceptional heart care, we have created designated Provider Care Teams.  These Care Teams include your primary Cardiologist (physician) and Advanced Practice Providers (APPs -  Physician Assistants and Nurse Practitioners) who all work together to provide you with the care you need, when you need it. ? ?Your next appointment:   ?As needed pending results of echocardiogram ? ?The format for your next appointment:   ?In Person ? ?Provider:   ?Sinclair Grooms, MD { ? ? ?Important Information About Sugar ? ? ? ? ?  ?

## 2022-03-08 ENCOUNTER — Other Ambulatory Visit: Payer: Self-pay | Admitting: Family Medicine

## 2022-03-08 DIAGNOSIS — I10 Essential (primary) hypertension: Secondary | ICD-10-CM

## 2022-03-23 ENCOUNTER — Ambulatory Visit (HOSPITAL_COMMUNITY): Payer: 59 | Attending: Cardiovascular Disease

## 2022-03-23 ENCOUNTER — Other Ambulatory Visit: Payer: Self-pay | Admitting: Interventional Cardiology

## 2022-03-23 ENCOUNTER — Telehealth: Payer: Self-pay

## 2022-03-23 ENCOUNTER — Encounter: Payer: Self-pay | Admitting: Interventional Cardiology

## 2022-03-23 DIAGNOSIS — I5020 Unspecified systolic (congestive) heart failure: Secondary | ICD-10-CM | POA: Insufficient documentation

## 2022-03-23 DIAGNOSIS — R0602 Shortness of breath: Secondary | ICD-10-CM

## 2022-03-23 DIAGNOSIS — Z79899 Other long term (current) drug therapy: Secondary | ICD-10-CM

## 2022-03-23 DIAGNOSIS — I10 Essential (primary) hypertension: Secondary | ICD-10-CM

## 2022-03-23 LAB — ECHOCARDIOGRAM COMPLETE
Area-P 1/2: 2.99 cm2
Calc EF: 49.5 %
S' Lateral: 3.81 cm
Single Plane A2C EF: 53.9 %
Single Plane A4C EF: 48.2 %

## 2022-03-23 MED ORDER — SACUBITRIL-VALSARTAN 24-26 MG PO TABS
1.0000 | ORAL_TABLET | Freq: Two times a day (BID) | ORAL | 11 refills | Status: DC
Start: 2022-03-23 — End: 2022-04-26

## 2022-03-23 NOTE — Telephone Encounter (Signed)
-----   Message from Belva Crome, MD sent at 03/23/2022  2:31 PM EDT ----- Let the patient know the heart is slightly enlarged and not squeezing normally.  Ordinarily in a woman, ejection fraction should be greater than 55%.  Hers is between 67 and 50%.  I would recommend discontinuing losartan and starting Entresto 24/25 mg twice daily.  Monitor blood pressure at least once per day.  She will need a follow-up office visit in 4 to 6 weeks.  A basic metabolic panel needs to be performed 1-2 weeks after starting Entresto.  He is due a brain natruretic peptide when the basic metabolic panel is performed. A copy will be sent to Stacie Glaze, DO

## 2022-03-23 NOTE — Telephone Encounter (Signed)
Spoke with patient to discuss echo results.  Per Dr. Tamala Julian: Let the patient know the heart is slightly enlarged and not squeezing normally.  Ordinarily in a woman, ejection fraction should be greater than 55%.  Hers is between 70 and 50%.  I would recommend discontinuing losartan and starting Entresto 24/25 mg twice daily.  Monitor blood pressure at least once per day.  She will need a follow-up office visit in 4 to 6 weeks.  A basic metabolic panel needs to be performed 1-2 weeks after starting Entresto.  He is due a brain natruretic peptide when the basic metabolic panel is performed.   Patient verbalized understanding. Entresto 24-'26mg'$  BID sent to pharmacy of choice. BMET ordered and scheduled to be drawn 04/05/22. OV with Dr. Tamala Julian scheduled for 04/26/22. Losartan discontinued.

## 2022-04-02 ENCOUNTER — Other Ambulatory Visit: Payer: Self-pay | Admitting: Family Medicine

## 2022-04-02 DIAGNOSIS — I10 Essential (primary) hypertension: Secondary | ICD-10-CM

## 2022-04-05 ENCOUNTER — Other Ambulatory Visit: Payer: 59

## 2022-04-05 DIAGNOSIS — R0602 Shortness of breath: Secondary | ICD-10-CM

## 2022-04-05 DIAGNOSIS — Z79899 Other long term (current) drug therapy: Secondary | ICD-10-CM

## 2022-04-05 DIAGNOSIS — I10 Essential (primary) hypertension: Secondary | ICD-10-CM

## 2022-04-06 LAB — BASIC METABOLIC PANEL
BUN/Creatinine Ratio: 16 (ref 9–23)
BUN: 15 mg/dL (ref 6–24)
CO2: 24 mmol/L (ref 20–29)
Calcium: 9.7 mg/dL (ref 8.7–10.2)
Chloride: 97 mmol/L (ref 96–106)
Creatinine, Ser: 0.95 mg/dL (ref 0.57–1.00)
Glucose: 83 mg/dL (ref 70–99)
Potassium: 3.7 mmol/L (ref 3.5–5.2)
Sodium: 137 mmol/L (ref 134–144)
eGFR: 76 mL/min/{1.73_m2} (ref >59)

## 2022-04-06 LAB — PRO B NATRIURETIC PEPTIDE: NT-Pro BNP: <36 pg/mL (ref 0–130)

## 2022-04-09 ENCOUNTER — Other Ambulatory Visit: Payer: Self-pay

## 2022-04-09 ENCOUNTER — Encounter (HOSPITAL_COMMUNITY): Payer: Self-pay

## 2022-04-09 ENCOUNTER — Emergency Department (HOSPITAL_COMMUNITY)
Admission: EM | Admit: 2022-04-09 | Discharge: 2022-04-10 | Disposition: A | Discharge status: Home / Self Care | Payer: 59 | Attending: Emergency Medicine | Admitting: Emergency Medicine

## 2022-04-09 ENCOUNTER — Emergency Department (HOSPITAL_COMMUNITY): Payer: 59

## 2022-04-09 DIAGNOSIS — Z20822 Contact with and (suspected) exposure to covid-19: Secondary | ICD-10-CM | POA: Diagnosis not present

## 2022-04-09 DIAGNOSIS — J069 Acute upper respiratory infection, unspecified: Secondary | ICD-10-CM | POA: Insufficient documentation

## 2022-04-09 DIAGNOSIS — R059 Cough, unspecified: Secondary | ICD-10-CM | POA: Diagnosis present

## 2022-04-09 DIAGNOSIS — J45901 Unspecified asthma with (acute) exacerbation: Secondary | ICD-10-CM | POA: Insufficient documentation

## 2022-04-09 MED ORDER — ALBUTEROL SULFATE HFA 108 (90 BASE) MCG/ACT IN AERS
2.0000 | INHALATION_SPRAY | Freq: Once | RESPIRATORY_TRACT | Status: AC
  Administered 2022-04-10: 2 via RESPIRATORY_TRACT
  Filled 2022-04-09: qty 6.7

## 2022-04-09 MED ORDER — BENZONATATE 100 MG PO CAPS
100.0000 mg | ORAL_CAPSULE | Freq: Once | ORAL | Status: AC
  Administered 2022-04-10: 100 mg via ORAL
  Filled 2022-04-09: qty 1

## 2022-04-09 MED ORDER — PREDNISONE 20 MG PO TABS
40.0000 mg | ORAL_TABLET | Freq: Once | ORAL | Status: AC
Start: 2022-04-10 — End: 2022-04-10
  Administered 2022-04-10: 40 mg via ORAL
  Filled 2022-04-09: qty 2
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
## 2022-04-10 LAB — SARS CORONAVIRUS 2 BY RT PCR: SARS Coronavirus 2 by RT PCR: NEGATIVE

## 2022-04-10 MED ORDER — AZITHROMYCIN 250 MG PO TABS: 250.0000 mg | ORAL_TABLET | Freq: Every day | ORAL | 0 refills | Status: DC

## 2022-04-10 MED ORDER — BENZONATATE 100 MG PO CAPS: 100.0000 mg | ORAL_CAPSULE | Freq: Two times a day (BID) | ORAL | 0 refills | Status: DC | PRN

## 2022-04-10 MED ORDER — PREDNISONE 20 MG PO TABS: 40.0000 mg | ORAL_TABLET | Freq: Every day | ORAL | 0 refills | Status: DC

## 2022-04-10 NOTE — ED Provider Notes (Signed)
[facility] Emergency Department Provider Note MRN:  [id number]  Arrival date & time: 04/10/22     Chief Complaint   Cough and Nasal Congestion   History of Present Illness   Courtney Donaldson is a 46 y.o. year-old female presents to the ED with chief complaint of cough and congestion.  She reports history of asthma and feels that her symptoms have been brought on and exacerbated by the [place].  She states that she has had some productive cough, but denies any fever.  She states that she has had some posttussive emesis as well.  Denies any other symptoms..  History provided by patient.   Review of Systems  Pertinent review of systems noted in HPI.    Physical Exam   Vitals:   04/09/22 2329  BP: (!) 133/95  Pulse: 80  Resp: 20  Temp: 98 F (36.7 C)  SpO2: 99%    CONSTITUTIONAL:  non toxic-appearing, NAD NEURO:  Alert and oriented x 3, CN 3-12 grossly intact EYES:  eyes equal and reactive ENT/NECK:  Supple, no stridor  CARDIO:  normal rate, regular rhythm, appears well-perfused  PULM:  No respiratory distress, CTAB GI/GU:  non-distended,  MSK/SPINE:  No gross deformities, no edema, moves all extremities  SKIN:  no rash, atraumatic   *Additional and/or pertinent findings included in MDM below  Diagnostic and Interventional Summary    EKG Interpretation  Date/Time:    Ventricular Rate:    PR Interval:    QRS Duration:   QT Interval:    QTC Calculation:   R Axis:     Text Interpretation:         Labs Reviewed  SARS CORONAVIRUS 2 BY RT PCR    DG Chest 2 View  Final Result      Medications  predniSONE (DELTASONE) tablet 40 mg (has no administration in time range)  benzonatate (TESSALON) capsule 100 mg (has no administration in time range)  albuterol (VENTOLIN HFA) 108 (90 Base) MCG/ACT inhaler 2 puff (has no administration in time range)     Procedures  /  Critical Care Procedures  ED Course and Medical  Decision Making  I have reviewed the triage vital signs, the nursing notes, and pertinent available records from the EMR.  Social Determinants Affecting Complexity of Care: Patient has no clinically significant social determinants affecting this chief complaint..   ED Course:   Patient here with cough and congestion.  Top differential diagnoses include URI, asthma exacerbation. Medical Decision Making Patient here with cough and congestion.  No wheezing on my exam, but patient states that she does feel like its exacerbating her asthma.  She is afebrile and vital signs are stable.  Chest x-ray is clear.  Problems Addressed: Exacerbation of asthma, unspecified asthma severity, unspecified whether persistent: acute illness or injury Upper respiratory tract infection, unspecified type: acute illness or injury  Amount and/or Complexity of Data Reviewed Radiology: ordered and independent interpretation performed.    Details: No opacity seen  Risk Prescription drug management.     Consultants: No consultations were needed in caring for this patient.   Treatment and Plan: Emergency department workup does not suggest an emergent condition requiring admission or immediate intervention beyond  what has been performed at this time. The patient is safe for discharge and has  been instructed to return immediately for worsening symptoms, change in  symptoms or any other concerns    Final Clinical Impressions(s) / ED Diagnoses  ICD-10-CM   1. Upper respiratory tract infection, unspecified type  J06.9     2. Exacerbation of asthma, unspecified asthma severity, unspecified whether persistent  J45.901       ED Discharge Orders          Ordered    azithromycin (ZITHROMAX) 250 MG tablet  Daily        04/10/22 0002    benzonatate (TESSALON) 100 MG capsule  2 times daily PRN        04/10/22 0002    predniSONE (DELTASONE) 20 MG tablet  Daily        04/10/22 0002               Discharge Instructions Discussed with and Provided to Patient:   Discharge Instructions   None      Montine Circle, PA-C 04/10/22 0015    Mesner, Corene Cornea, MD 04/10/22 0320

## 2022-04-11 ENCOUNTER — Telehealth: Payer: Self-pay
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
## 2022-04-26 ENCOUNTER — Ambulatory Visit (INDEPENDENT_AMBULATORY_CARE_PROVIDER_SITE_OTHER): Payer: 59 | Admitting: Interventional Cardiology

## 2022-04-26 ENCOUNTER — Encounter: Payer: Self-pay | Admitting: Interventional Cardiology

## 2022-04-26 VITALS — BP 104/82 | HR 75 | Ht 65.0 in | Wt 183.0 lb

## 2022-04-26 DIAGNOSIS — R0789 Other chest pain: Secondary | ICD-10-CM

## 2022-04-26 DIAGNOSIS — R0602 Shortness of breath: Secondary | ICD-10-CM

## 2022-04-26 DIAGNOSIS — I10 Essential (primary) hypertension: Secondary | ICD-10-CM | POA: Diagnosis not present

## 2022-04-26 DIAGNOSIS — E785 Hyperlipidemia, unspecified: Secondary | ICD-10-CM

## 2022-04-26 MED ORDER — ENTRESTO 49-51 MG PO TABS: 1.0000 | ORAL_TABLET | Freq: Two times a day (BID) | ORAL | 11 refills | Status: AC
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
## 2022-05-06 ENCOUNTER — Ambulatory Visit: Payer: 59 | Admitting: Family Medicine

## 2022-05-06 ENCOUNTER — Encounter: Payer: Self-pay | Admitting: Family Medicine

## 2022-05-06 VITALS — BP 120/78 | HR 83 | Resp 12 | Ht 65.0 in | Wt 186.4 lb

## 2022-05-06 DIAGNOSIS — R0602 Shortness of breath: Secondary | ICD-10-CM

## 2022-05-06 DIAGNOSIS — J4520 Mild intermittent asthma, uncomplicated: Secondary | ICD-10-CM | POA: Diagnosis not present

## 2022-05-06 DIAGNOSIS — J3089 Other allergic rhinitis: Secondary | ICD-10-CM

## 2022-05-06 DIAGNOSIS — I10 Essential (primary) hypertension: Secondary | ICD-10-CM | POA: Diagnosis not present

## 2022-05-06 DIAGNOSIS — K219 Gastro-esophageal reflux disease without esophagitis: Secondary | ICD-10-CM | POA: Diagnosis not present

## 2022-05-06 MED ORDER — MONTELUKAST SODIUM 10 MG PO TABS: 10.0000 mg | ORAL_TABLET | Freq: Every day | ORAL | 3 refills | Status: DC

## 2022-05-06 MED ORDER — PANTOPRAZOLE SODIUM 40 MG PO TBEC: 40.0000 mg | DELAYED_RELEASE_TABLET | Freq: Every day | ORAL | 3 refills | Status: DC

## 2022-05-06 MED ORDER — BUDESONIDE-FORMOTEROL FUMARATE 160-4.5 MCG/ACT IN AERO: 2.0000 | INHALATION_SPRAY | Freq: Two times a day (BID) | RESPIRATORY_TRACT | 2 refills | Status: DC

## 2022-05-06 NOTE — Assessment & Plan Note (Signed)
BP adequately controlled. Continue Entresto, HCTZ,and Amlodipine same dose. Continue low salt diet and monitoring BP regularly.

## 2022-05-06 NOTE — Assessment & Plan Note (Signed)
SOB has not improved with Symbicort 160-4.5 mcg but some of her other chronic comorbilities can be contributing factors, including allergic rhinitis.So no changes for now. Continue Albuterol inh 2 puff qid prn. Singulair 10 mg added today.

## 2022-05-06 NOTE — Assessment & Plan Note (Signed)
Could also be aggravating cough. Protonix 40 mg daily 30 min before breakfast started today. GERD precautions discussed and recommended. F/U in 3-4 months, before if needed.

## 2022-05-06 NOTE — Patient Instructions (Addendum)
A few things to remember from today's visit:  Shortness of breath  Mild intermittent reactive airway disease without complication - Plan: montelukast (SINGULAIR) 10 MG tablet, budesonide-formoterol (SYMBICORT) 160-4.5 MCG/ACT inhaler  Gastroesophageal reflux disease, unspecified whether esophagitis present - Plan: pantoprazole (PROTONIX) 40 MG tablet  Hypertension, essential, benign  If you need refills please call your pharmacy. Do not use My Chart to request refills or for acute issues that need immediate attention.   Today Singulair 10 mg added to take at night and Protonix for acid reflux to take 30 min before breakfast. Let me know in 4 weeks if you cough and shortness of breath is better, we can arrange pulmonologist appt if needed. Continue monitoring blood pressure.  Please be sure medication list is accurate. If a new problem present, please set up appointment sooner than planned today.  Food Choices for Gastroesophageal Reflux Disease, Adult When you have gastroesophageal reflux disease (GERD), the foods you eat and your eating habits are very important. Choosing the right foods can help ease your discomfort. Think about working with a food expert (dietitian) to help you make good choices. What are tips for following this plan? Reading food labels Look for foods that are low in saturated fat. Foods that may help with your symptoms include: Foods that have less than 5% of daily value (DV) of fat. Foods that have 0 grams of trans fat. Cooking Do not fry your food. Cook your food by baking, steaming, grilling, or broiling. These are all methods that do not need a lot of fat for cooking. To add flavor, try to use herbs that are low in spice and acidity. Meal planning  Choose healthy foods that are low in fat, such as: Fruits and vegetables. Whole grains. Low-fat dairy products. Lean meats, fish, and poultry. Eat small meals often instead of eating 3 large meals each day.  Eat your meals slowly in a place where you are relaxed. Avoid bending over or lying down until 2-3 hours after eating. Limit high-fat foods such as fatty meats or fried foods. Limit your intake of fatty foods, such as oils, butter, and shortening. Avoid the following as told by your doctor: Foods that cause symptoms. These may be different for different people. Keep a food diary to keep track of foods that cause symptoms. Alcohol. Drinking a lot of liquid with meals. Eating meals during the 2-3 hours before bed. Lifestyle Stay at a healthy weight. Ask your doctor what weight is healthy for you. If you need to lose weight, work with your doctor to do so safely. Exercise for at least 30 minutes on 5 or more days each week, or as told by your doctor. Wear loose-fitting clothes. Do not smoke or use any products that contain nicotine or tobacco. If you need help quitting, ask your doctor. Sleep with the head of your bed higher than your feet. Use a wedge under the mattress or blocks under the bed frame to raise the head of the bed. Chew sugar-free gum after meals. What foods should eat?  Eat a healthy, well-balanced diet of fruits, vegetables, whole grains, low-fat dairy products, lean meats, fish, and poultry. Each person is different. Foods that may cause symptoms in one person may not cause any symptoms in another person. Work with your doctor to find foods that are safe for you. The items listed above may not be a complete list of what you can eat and drink. Contact a food expert for more options. What  foods should I avoid? Limiting some of these foods may help in managing the symptoms of GERD. Everyone is different. Talk with a food expert or your doctor to help you find the exact foods to avoid, if any. Fruits Any fruits prepared with added fat. Any fruits that cause symptoms. For some people, this may include citrus fruits, such as oranges, grapefruit, pineapple, and  lemons. Vegetables Deep-fried vegetables. [place] fries. Any vegetables prepared with added fat. Any vegetables that cause symptoms. For some people, this may include tomatoes and tomato products, chili peppers, onions and garlic, and horseradish. Grains Pastries or quick breads with added fat. Meats and other proteins High-fat meats, such as fatty beef or pork, hot dogs, ribs, ham, sausage, salami, and bacon. Fried meat or protein, including fried fish and fried chicken. Nuts and nut butters, in large amounts. Dairy Whole milk and chocolate milk. Sour cream. Cream. Ice cream. Cream cheese. Milkshakes. Fats and oils Butter. Margarine. Shortening. Ghee. Beverages Coffee and tea, with or without caffeine. Carbonated beverages. Sodas. Energy drinks. Fruit juice made with acidic fruits, such as orange or grapefruit. Tomato juice. Alcoholic drinks. Sweets and desserts Chocolate and cocoa. Donuts. Seasonings and condiments Pepper. Peppermint and spearmint. Added salt. Any condiments, herbs, or seasonings that cause symptoms. For some people, this may include curry, hot sauce, or vinegar-based salad dressings. The items listed above may not be a complete list of what you should not eat and drink. Contact a food expert for more options. Questions to ask your doctor Diet and lifestyle changes are often the first steps that are taken to manage symptoms of GERD. If diet and lifestyle changes do not help, talk with your doctor about taking medicines. Where to find more information International Foundation for Gastrointestinal Disorders: aboutgerd.org Summary When you have GERD, food and lifestyle choices are very important in easing your symptoms. Eat small meals often instead of 3 large meals a day. Eat your meals slowly and in a place where you are relaxed. Avoid bending over or lying down until 2-3 hours after eating. Limit high-fat foods such as fatty meats or fried foods. This information is not  intended to replace advice given to you by your health care provider. Make sure you discuss any questions you have with your health care provider. Document Revised: 04/27/2020 Document Reviewed: 04/27/2020 Elsevier Patient Education  [place].

## 2022-05-06 NOTE — Assessment & Plan Note (Signed)
Resume Flonase nasal spray daily as needed. Nasal saline irrigations as needed. Singulair 10 mg daily added today.

## 2022-05-08 ENCOUNTER — Other Ambulatory Visit: Payer: Self-pay | Admitting: Family Medicine

## 2022-05-25 DIAGNOSIS — S83519A Sprain of anterior cruciate ligament of unspecified knee, initial encounter: Secondary | ICD-10-CM | POA: Insufficient documentation

## 2022-05-26 NOTE — Telephone Encounter (Signed)
Clearance request was faxed to our office today, please see previous notes.   I d/w Diona Browner, NP pre op provider today to please review if the pt has been cleared. Raquel Sarna, [facility] replied that she has sent a note back to Dr. Tamala Julian to comment if he has cleared the pt after seeing her and cardiac testing completed. Once the pt has been cleared we will be sure to fax over the clearance notes.   I will send FYI note to requesting office.

## 2022-05-27 NOTE — Telephone Encounter (Signed)
   Patient Name: Courtney Donaldson  DOB: December 24, 1975 MRN: [id number]  Primary Cardiologist: Sinclair Grooms, MD  Chart reviewed as part of pre-operative protocol coverage. Per Dr. Tamala Julian, primary cardiologist, given past medical history and time since last visit, based on ACC/AHA guidelines, Courtney Donaldson would be at acceptable risk for the planned procedure without further cardiovascular testing.   I will route this recommendation to the requesting party via Epic fax function and remove from pre-op pool.  Please call with questions.  Lenna Sciara, NP 05/27/2022, 12:30 PM

## 2022-06-02 ENCOUNTER — Other Ambulatory Visit: Payer: Self-pay | Admitting: Family Medicine

## 2022-06-02 DIAGNOSIS — I10 Essential (primary) hypertension: Secondary | ICD-10-CM

## 2022-06-02 NOTE — Telephone Encounter (Signed)
See cardiology note from 03/23/22

## 2022-06-14 ENCOUNTER — Encounter (HOSPITAL_COMMUNITY): Payer: Self-pay

## 2022-06-14 ENCOUNTER — Emergency Department (HOSPITAL_COMMUNITY): Payer: 59

## 2022-06-14 ENCOUNTER — Other Ambulatory Visit: Payer: Self-pay

## 2022-06-14 ENCOUNTER — Emergency Department (HOSPITAL_COMMUNITY)
Admission: EM | Admit: 2022-06-14 | Discharge: 2022-06-15 | Disposition: A | Discharge status: Home / Self Care | Payer: 59 | Attending: Emergency Medicine | Admitting: Emergency Medicine

## 2022-06-14 DIAGNOSIS — I10 Essential (primary) hypertension: Secondary | ICD-10-CM | POA: Diagnosis not present

## 2022-06-14 DIAGNOSIS — F17210 Nicotine dependence, cigarettes, uncomplicated: Secondary | ICD-10-CM | POA: Diagnosis not present

## 2022-06-14 DIAGNOSIS — Z7951 Long term (current) use of inhaled steroids: Secondary | ICD-10-CM | POA: Diagnosis not present

## 2022-06-14 DIAGNOSIS — E876 Hypokalemia: Secondary | ICD-10-CM | POA: Diagnosis not present

## 2022-06-14 DIAGNOSIS — Z20822 Contact with and (suspected) exposure to covid-19: Secondary | ICD-10-CM | POA: Diagnosis not present

## 2022-06-14 DIAGNOSIS — J45901 Unspecified asthma with (acute) exacerbation: Secondary | ICD-10-CM | POA: Insufficient documentation

## 2022-06-14 DIAGNOSIS — Z8616 Personal history of COVID-19: Secondary | ICD-10-CM | POA: Insufficient documentation

## 2022-06-14 DIAGNOSIS — Z79899 Other long term (current) drug therapy: Secondary | ICD-10-CM | POA: Insufficient documentation

## 2022-06-14 DIAGNOSIS — J4521 Mild intermittent asthma with (acute) exacerbation: Secondary | ICD-10-CM

## 2022-06-14 LAB — CBC
HCT: 37.1 % (ref 36.0–46.0)
Hemoglobin: 12.2 g/dL (ref 12.0–15.0)
MCH: 30.6 pg (ref 26.0–34.0)
MCHC: 32.9 g/dL (ref 30.0–36.0)
MCV: 93 fL (ref 80.0–100.0)
Platelets: 304 10*3/uL (ref 150–400)
RBC: 3.99 MIL/uL (ref 3.87–5.11)
RDW: 12.8 % (ref 11.5–15.5)
WBC: 6.6 10*3/uL (ref 4.0–10.5)
nRBC: 0 % (ref 0.0–0.2)

## 2022-06-14 LAB — I-STAT BETA HCG BLOOD, ED (MC, WL, AP ONLY): I-stat hCG, quantitative: <5 m[IU]/mL (ref <5)

## 2022-06-14 MED ORDER — IPRATROPIUM-ALBUTEROL 0.5-2.5 (3) MG/3ML IN SOLN
3.0000 mL | Freq: Once | RESPIRATORY_TRACT | Status: AC
  Administered 2022-06-15: 3 mL via RESPIRATORY_TRACT

## 2022-06-14 MED ORDER — DEXAMETHASONE SODIUM PHOSPHATE 10 MG/ML IJ SOLN
10.0000 mg | Freq: Once | INTRAMUSCULAR | Status: AC
  Administered 2022-06-15: 10 mg via INTRAVENOUS

## 2022-06-14 NOTE — ED Triage Notes (Signed)
Patient said she is having an asthma attack. Courtney Donaldson today she has coughed so hard it has made her vomit all day. Took 2 puffs of her inhaler in the last hour. No relief.

## 2022-06-14 NOTE — ED Provider Triage Note (Signed)
Emergency Medicine Provider Triage Evaluation Note  Courtney Donaldson , a 46 y.o. female  was evaluated in triage.  Pt complains of asthma exacerbation.  Patient reports starting night she feels as if she had an asthma flare.  The patient is complaining of headache, cough, nasal congestion.  Patient states she took her Symbicort without relief.  Patient denies any fevers, nausea or vomiting.  Review of Systems  Positive:  Negative:   Physical Exam  BP (!) 128/92 (BP Location: Left Arm)   Pulse 87   Temp 98.2 F (36.8 C) (Oral)   Resp 12   Ht '5\' 5"'$  (1.651 m)   Wt 83.5 kg   LMP 10/17/2019   SpO2 93%   BMI 30.62 kg/m  Gen:   Awake, no distress   Resp:  Normal effort  MSK:   Moves extremities without difficulty  Other:  Patient lung sounds are clear bilaterally.  There is no wheezing.  Medical Decision Making  Medically screening exam initiated at 11:12 PM.  Appropriate orders placed.  Courtney Donaldson was informed that the remainder of the evaluation will be completed by another provider, this initial triage assessment does not replace that evaluation, and the importance of remaining in the ED until their evaluation is complete.     Azucena Cecil, PA-C 06/14/22 2313

## 2022-06-15 LAB — BASIC METABOLIC PANEL
Anion gap: 8 (ref 5–15)
BUN: 9 mg/dL (ref 6–20)
CO2: 24 mmol/L (ref 22–32)
Calcium: 8.9 mg/dL (ref 8.9–10.3)
Chloride: 106 mmol/L (ref 98–111)
Creatinine, Ser: 0.93 mg/dL (ref 0.44–1.00)
GFR, Estimated: >60 mL/min (ref >60)
Glucose, Bld: 101 mg/dL — ABNORMAL HIGH (ref 70–99)
Potassium: 2.8 mmol/L — ABNORMAL LOW (ref 3.5–5.1)
Sodium: 138 mmol/L (ref 135–145)

## 2022-06-15 LAB — RESP PANEL BY RT-PCR (FLU A&B, COVID) ARPGX2
Influenza A by PCR: NEGATIVE
Influenza B by PCR: NEGATIVE
SARS Coronavirus 2 by RT PCR: NEGATIVE

## 2022-06-15 MED ORDER — ALBUTEROL SULFATE HFA 108 (90 BASE) MCG/ACT IN AERS
4.0000 | INHALATION_SPRAY | Freq: Once | RESPIRATORY_TRACT | Status: AC
  Administered 2022-06-15: 4 via RESPIRATORY_TRACT
  Filled 2022-06-15: qty 6.7

## 2022-06-15 MED ORDER — DEXAMETHASONE SODIUM PHOSPHATE 10 MG/ML IJ SOLN
INTRAMUSCULAR | Status: AC
  Filled 2022-06-15: qty 1

## 2022-06-15 MED ORDER — POTASSIUM CHLORIDE CRYS ER 20 MEQ PO TBCR: 20.0000 meq | EXTENDED_RELEASE_TABLET | Freq: Every day | ORAL | 0 refills | Status: AC

## 2022-06-15 MED ORDER — POTASSIUM CHLORIDE CRYS ER 20 MEQ PO TBCR
40.0000 meq | EXTENDED_RELEASE_TABLET | Freq: Once | ORAL | Status: AC
  Administered 2022-06-15: 40 meq via ORAL
  Filled 2022-06-15: qty 2

## 2022-06-15 MED ORDER — IPRATROPIUM BROMIDE 0.02 % IN SOLN
RESPIRATORY_TRACT | Status: AC
  Filled 2022-06-15: qty 2.5

## 2022-06-15 NOTE — Discharge Instructions (Signed)
You were seen today for cough and asthma symptoms.  Use the rescue inhaler as needed for cough and shortness of breath.  Follow-up with pulmonology as scheduled.  You were noted to have a slightly low potassium.  Increase potassium rich foods in your diet.  Take potassium supplements as prescribed.  Follow-up with your primary physician for recheck of potassium.

## 2022-06-15 NOTE — ED Provider Notes (Signed)
[facility] DEPT Provider Note   CSN: [id number] Arrival date & time: 06/14/22  2232     History  Chief Complaint  Patient presents with   Asthma    Courtney Donaldson is a 46 y.o. female.  HPI     This 46 year old female with a history of asthma who presents with concerns for an asthma exacerbation.  Reports that her asthma is normally triggered by cigarette smoke.  She states that her uncle is a cigarette close to her.  She began to have a coughing fit and had posttussive emesis.  She reports headache, cough, nasal congestion.  No fevers.  She does not have a rescue inhaler.  She only takes Symbicort.  She has a pulmonology visit on 8/17.  Reports last asthma exacerbation was related to [place] smoke.  Home Medications Prior to Admission medications   Medication Sig Start Date End Date Taking? Authorizing Provider  albuterol (VENTOLIN HFA) 108 (90 Base) MCG/ACT inhaler Inhale 2 puffs into the lungs every 6 (six) hours as needed for wheezing or shortness of breath. 02/11/22 06/14/22 Yes Martinique, Betty G, MD  amLODipine (NORVASC) 5 MG tablet TAKE 1 TABLET (5 MG TOTAL) BY MOUTH DAILY. 04/04/22  Yes Martinique, Betty G, MD  budesonide-formoterol [facility]) 160-4.5 MCG/ACT inhaler Inhale 2 puffs into the lungs 2 (two) times daily. 05/06/22  Yes Martinique, Betty G, MD  hydrochlorothiazide (HYDRODIURIL) 25 MG tablet TAKE 1 TABLET (25 MG TOTAL) BY MOUTH DAILY. 04/04/22  Yes Martinique, Betty G, MD  montelukast (SINGULAIR) 10 MG tablet Take 1 tablet (10 mg total) by mouth at bedtime. 05/06/22  Yes Martinique, Betty G, MD  Naproxen Sodium (NAPRELAN) 750 MG TB24 Take 1 tablet by mouth as needed (pain).   Yes [provider]  pantoprazole (PROTONIX) 40 MG tablet Take 1 tablet (40 mg total) by mouth daily. 05/06/22  Yes Martinique, Betty G, MD  potassium chloride SA (KLOR-CON M) 20 MEQ tablet Take 1 tablet (20 mEq total) by mouth daily. 06/15/22  Yes Naya Ilagan, Barbette Hair, MD   sacubitril-valsartan (ENTRESTO) 49-51 MG Take 1 tablet by mouth 2 (two) times daily. 04/26/22  Yes Belva Crome, MD      Allergies    Patient has no known allergies.    Review of Systems   Review of Systems  Constitutional:  Negative for fever.  Respiratory:  Positive for cough and shortness of breath.   Neurological:  Positive for headaches.  All other systems reviewed and are negative.   Physical Exam Updated Vital Signs BP (!) 132/96   Pulse 60   Temp 98.2 F (36.8 C) (Oral)   Resp 13   Ht 1.651 m ('5\' 5"'$ )   Wt 83.5 kg   LMP 10/17/2019   SpO2 96%   BMI 30.62 kg/m  Physical Exam Vitals and nursing note reviewed.  Constitutional:      Appearance: She is well-developed. She is not ill-appearing.  HENT:     Head: Normocephalic and atraumatic.  Eyes:     Pupils: Pupils are equal, round, and reactive to light.  Cardiovascular:     Rate and Rhythm: Normal rate and regular rhythm.     Heart sounds: Normal heart sounds.  Pulmonary:     Effort: Pulmonary effort is normal. No respiratory distress.     Breath sounds: No wheezing.     Comments: Occasional wheeze, good air movement, no acute distress Abdominal:     Palpations: Abdomen is soft.  Tenderness: There is no abdominal tenderness.  Musculoskeletal:     Cervical back: Neck supple.     Right lower leg: No edema.     Left lower leg: No edema.  Skin:    General: Skin is warm and dry.  Neurological:     Mental Status: She is alert and oriented to person, place, and time.  Psychiatric:        Mood and Affect: Mood normal.     ED Results / Procedures / Treatments   Labs (all labs ordered are listed, but only abnormal results are displayed) Labs Reviewed  BASIC METABOLIC PANEL - Abnormal; Notable for the following components:      Result Value   Potassium 2.8 (*)    Glucose, Bld 101 (*)    All other components within normal limits  RESP PANEL BY RT-PCR (FLU A&B, COVID) ARPGX2  CBC  I-STAT BETA HCG  BLOOD, ED ([facility], [facility], [facility] ONLY)    EKG EKG Interpretation  Date/Time:  Tuesday June 14 2022 22:43:33 EDT Ventricular Rate:  91 PR Interval:  131 QRS Duration: 91 QT Interval:  362 QTC Calculation: 446 R Axis:   -16 Text Interpretation: Sinus rhythm Abnormal R-wave progression, early transition Left ventricular hypertrophy Borderline T abnormalities, diffuse leads Confirmed by Thayer Jew [phone]) on 06/15/2022 12:00:41 AM  Radiology DG Chest 2 View  Result Date: 06/14/2022 CLINICAL DATA:  Cough, history of asthma EXAM: CHEST - 2 VIEW COMPARISON:  Radiographs 04/09/2022 FINDINGS: No focal consolidation, pleural effusion, or pneumothorax. Normal cardiomediastinal silhouette. No acute osseous abnormality. IMPRESSION: No active cardiopulmonary disease. Electronically Signed   By: Placido Sou M.D.   On: 06/14/2022 23:01    Procedures Procedures    Medications Ordered in ED Medications  dexamethasone (DECADRON) 10 MG/ML injection (  Not Given 06/15/22 0108)  ipratropium (ATROVENT) 0.02 % nebulizer solution (  Not Given 06/15/22 0108)  albuterol (VENTOLIN HFA) 108 (90 Base) MCG/ACT inhaler 4 puff (has no administration in time range)  dexamethasone (DECADRON) injection 10 mg (10 mg Intravenous Given 06/15/22 0031)  ipratropium-albuterol (DUONEB) 0.5-2.5 (3) MG/3ML nebulizer solution 3 mL (3 mLs Nebulization Given 06/15/22 0026)  potassium chloride SA (KLOR-CON M) CR tablet 40 mEq (40 mEq Oral Given 06/15/22 0053)    ED Course/ Medical Decision Making/ A&P                           Medical Decision Making Amount and/or Complexity of Data Reviewed Labs: ordered. Radiology: ordered.  Risk Prescription drug management.   This patient presents to the ED for concern of cough, asthma exacerbation, this involves an extensive number of treatment options, and is a complaint that carries with it a high risk of complications and morbidity.  I considered the following differential and  admission for this acute, potentially life threatening condition.  The differential diagnosis includes asthma exacerbation, pneumonia, viral URI, pneumothorax  MDM:    This is a 46 year old female who presents with cough and posttussive emesis.  Reports symptoms are related to her asthma.  She is nontoxic.  Vital signs are reassuring.  She is in no respiratory distress.  She has fair air movement with only an occasional wheeze.  Labs obtained and reviewed.  COVID and influenza testing are negative.  Potassium slightly low at 2.8.  This was replaced.  Chest x-ray without pneumothorax or pneumonia.  EKG without acute ischemic arrhythmic changes.  Patient was given Decadron and a DuoNeb.  She reports that she had some persistent coughing following the DuoNeb.  Breath sounds improved.  Will provide with a rescue inhaler.  She has pulmonology follow-up.  Will discharge with rescue inhaler and potassium supplementation.  Highly suspect asthma exacerbation.  (Labs, imaging, consults)  Labs: I Ordered, and personally interpreted labs.  The pertinent results include: CBC, BMP, urine pregnancy  Imaging Studies ordered: I ordered imaging studies including chest x-ray without pneumothorax or pneumonia I independently visualized and interpreted imaging. I agree with the radiologist interpretation  Additional history obtained from chart review.  External records from outside source obtained and reviewed including prior evaluations  Cardiac Monitoring: The patient was maintained on a cardiac monitor.  I personally viewed and interpreted the cardiac monitored which showed an underlying rhythm of: Normal sinus rhythm  Reevaluation: After the interventions noted above, I reevaluated the patient and found that they have :improved  Social Determinants of Health: Lives independently  Disposition: Discharge  Co morbidities that complicate the patient evaluation  Past Medical History:  Diagnosis Date    Bronchitis 10/2013   Depression    History of COVID-19 11/25/2019   June, 2022   Hypertension    Low vitamin D level    Right ovarian cyst 2015   Calcified cyst - possible dermoid.  Needs yearly ultrasound.     Medicines Meds ordered this encounter  Medications   dexamethasone (DECADRON) injection 10 mg   ipratropium-albuterol (DUONEB) 0.5-2.5 (3) MG/3ML nebulizer solution 3 mL   potassium chloride SA (KLOR-CON M) CR tablet 40 mEq   dexamethasone (DECADRON) 10 MG/ML injection    Alberteen Sam J: cabinet override   ipratropium (ATROVENT) 0.02 % nebulizer solution    Alberteen Sam J: cabinet override   albuterol (VENTOLIN HFA) 108 (90 Base) MCG/ACT inhaler 4 puff   potassium chloride SA (KLOR-CON M) 20 MEQ tablet    Sig: Take 1 tablet (20 mEq total) by mouth daily.    Dispense:  10 tablet    Refill:  0    I have reviewed the patients home medicines and have made adjustments as needed  Problem List / ED Course: Problem List Items Addressed This Visit   None Visit Diagnoses     Mild intermittent asthma with acute exacerbation    -  Primary   Relevant Medications   dexamethasone (DECADRON) injection 10 mg (Completed)   ipratropium-albuterol (DUONEB) 0.5-2.5 (3) MG/3ML nebulizer solution 3 mL (Completed)   dexamethasone (DECADRON) 10 MG/ML injection   ipratropium (ATROVENT) 0.02 % nebulizer solution   albuterol (VENTOLIN HFA) 108 (90 Base) MCG/ACT inhaler 4 puff   Hypokalemia                       Final Clinical Impression(s) / ED Diagnoses Final diagnoses:  Mild intermittent asthma with acute exacerbation  Hypokalemia    Rx / DC Orders ED Discharge Orders          Ordered    potassium chloride SA (KLOR-CON M) 20 MEQ tablet  Daily        06/15/22 0218              Merryl Hacker, MD 06/15/22 0221

## 2022-06-15 NOTE — ED Provider Notes (Incomplete)
[facility] DEPT Provider Note   CSN: [id number] Arrival date & time: 06/14/22  2232     History {Add pertinent medical, surgical, social history, OB history to HPI:1} Chief Complaint  Patient presents with  . Asthma    Courtney Donaldson is a 46 y.o. female.  HPI     Home Medications Prior to Admission medications   Medication Sig Start Date End Date Taking? Authorizing Provider  albuterol (VENTOLIN HFA) 108 (90 Base) MCG/ACT inhaler Inhale 2 puffs into the lungs every 6 (six) hours as needed for wheezing or shortness of breath. 02/11/22 06/14/22 Yes Martinique, Betty G, MD  amLODipine (NORVASC) 5 MG tablet TAKE 1 TABLET (5 MG TOTAL) BY MOUTH DAILY. 04/04/22  Yes [place], Betty G, MD  budesonide-formoterol [facility]) 160-4.5 MCG/ACT inhaler Inhale 2 puffs into the lungs 2 (two) times daily. 05/06/22  Yes [place], Betty G, MD  hydrochlorothiazide (HYDRODIURIL) 25 MG tablet TAKE 1 TABLET (25 MG TOTAL) BY MOUTH DAILY. 04/04/22  Yes Martinique, Betty G, MD  montelukast (SINGULAIR) 10 MG tablet Take 1 tablet (10 mg total) by mouth at bedtime. 05/06/22  Yes [place], Betty G, MD  Naproxen Sodium (NAPRELAN) 750 MG TB24 Take 1 tablet by mouth as needed (pain).   Yes [provider]  pantoprazole (PROTONIX) 40 MG tablet Take 1 tablet (40 mg total) by mouth daily. 05/06/22  Yes [place], Betty G, MD  sacubitril-valsartan (ENTRESTO) 49-51 MG Take 1 tablet by mouth 2 (two) times daily. 04/26/22  Yes Belva Crome, MD      Allergies    Patient has no known allergies.    Review of Systems   Review of Systems  Physical Exam Updated Vital Signs BP 127/82   Pulse 74   Temp 98.2 F (36.8 C) (Oral)   Resp 16   Ht 1.651 m ('5\' 5"'$ )   Wt 83.5 kg   LMP 10/17/2019   SpO2 100%   BMI 30.62 kg/m  Physical Exam  ED Results / Procedures / Treatments   Labs (all labs ordered are listed, but only abnormal results are displayed) Labs Reviewed  RESP PANEL BY RT-PCR (FLU  A&B, COVID) ARPGX2  CBC  BASIC METABOLIC PANEL  I-STAT BETA HCG BLOOD, ED ([facility], [facility], [facility] ONLY)    EKG EKG Interpretation  Date/Time:  Tuesday June 14 2022 22:43:33 EDT Ventricular Rate:  91 PR Interval:  131 QRS Duration: 91 QT Interval:  362 QTC Calculation: 446 R Axis:   -16 Text Interpretation: Sinus rhythm Abnormal R-wave progression, early transition Left ventricular hypertrophy Borderline T abnormalities, diffuse leads Confirmed by Thayer Jew [phone]) on 06/15/2022 12:00:41 AM  Radiology DG Chest 2 View  Result Date: 06/14/2022 CLINICAL DATA:  Cough, history of asthma EXAM: CHEST - 2 VIEW COMPARISON:  Radiographs 04/09/2022 FINDINGS: No focal consolidation, pleural effusion, or pneumothorax. Normal cardiomediastinal silhouette. No acute osseous abnormality. IMPRESSION: No active cardiopulmonary disease. Electronically Signed   By: Placido Sou M.D.   On: 06/14/2022 23:01    Procedures Procedures  {Document cardiac monitor, telemetry assessment procedure when appropriate:1}  Medications Ordered in ED Medications  dexamethasone (DECADRON) injection 10 mg (has no administration in time range)  ipratropium-albuterol (DUONEB) 0.5-2.5 (3) MG/3ML nebulizer solution 3 mL (has no administration in time range)    ED Course/ Medical Decision Making/ A&P                           Medical Decision Making  Amount and/or Complexity of Data Reviewed Labs: ordered. Radiology: ordered.  Risk Prescription drug management.   ***  {Document critical care time when appropriate:1} {Document review of labs and clinical decision tools ie heart score, Chads2Vasc2 etc:1}  {Document your independent review of radiology images, and any outside records:1} {Document your discussion with family members, caretakers, and with consultants:1} {Document social determinants of health affecting pt's care:1} {Document your decision making why or why not admission, treatments were  needed:1} Final Clinical Impression(s) / ED Diagnoses Final diagnoses:  None    Rx / DC Orders ED Discharge Orders     None

## 2022-06-16 ENCOUNTER — Ambulatory Visit: Payer: 59 | Admitting: Critical Care Medicine

## 2022-06-20 ENCOUNTER — Ambulatory Visit: Payer: 59 | Admitting: Critical Care Medicine

## 2022-06-25 ENCOUNTER — Other Ambulatory Visit: Payer: Self-pay | Admitting: Family Medicine

## 2022-06-25 DIAGNOSIS — J4520 Mild intermittent asthma, uncomplicated: Secondary | ICD-10-CM

## 2022-06-25 DIAGNOSIS — K219 Gastro-esophageal reflux disease without esophagitis: Secondary | ICD-10-CM
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
## 2022-06-28 ENCOUNTER — Ambulatory Visit (INDEPENDENT_AMBULATORY_CARE_PROVIDER_SITE_OTHER): Payer: 59 | Admitting: Family Medicine

## 2022-06-28 ENCOUNTER — Encounter: Payer: Self-pay | Admitting: Family Medicine

## 2022-06-28 VITALS — BP 130/80 | HR 76 | Resp 12 | Ht 65.0 in | Wt 193.2 lb

## 2022-06-28 DIAGNOSIS — E876 Hypokalemia: Secondary | ICD-10-CM | POA: Diagnosis not present

## 2022-06-28 DIAGNOSIS — R0781 Pleurodynia: Secondary | ICD-10-CM

## 2022-06-28 DIAGNOSIS — J3089 Other allergic rhinitis: Secondary | ICD-10-CM

## 2022-06-28 DIAGNOSIS — I10 Essential (primary) hypertension: Secondary | ICD-10-CM

## 2022-06-28 DIAGNOSIS — K921 Melena: Secondary | ICD-10-CM | POA: Diagnosis not present

## 2022-06-28 DIAGNOSIS — R053 Chronic cough: Secondary | ICD-10-CM | POA: Diagnosis not present

## 2022-06-28 DIAGNOSIS — I5020 Unspecified systolic (congestive) heart failure: Secondary | ICD-10-CM

## 2022-06-28 MED ORDER — HYDROCOD POLI-CHLORPHE POLI ER 10-8 MG/5ML PO SUER: 5.0000 mL | Freq: Two times a day (BID) | ORAL | 0 refills | Status: AC | PRN

## 2022-06-28 NOTE — Assessment & Plan Note (Addendum)
We discussed possible etiologies. GERD, allergies, asthma, COPD, and Entresto are some to be considered She was initially scheduled to see pulmonologist but appointment had to be rescheduled, would like a new referral to see a different pulmonologist. Tussionex 5 ml bid prn prescribed as requested, she can take it for up to 10 days.  Continue Symbicort 160-4.5 mcg twice daily and albuterol inhaler 2 puff every 6 hours as needed.

## 2022-06-28 NOTE — Patient Instructions (Addendum)
A few things to remember from today's visit:   Mild intermittent reactive airway disease without complication - Plan: Ambulatory referral to Pulmonology  Blood in stool - Plan: Ambulatory referral to Gastroenterology  Chronic cough - Plan: Ambulatory referral to Pulmonology, chlorpheniramine-HYDROcodone (TUSSIONEX) 10-8 MG/5ML  Hypokalemia - Plan: Basic metabolic panel  Hypertension, essential, benign  If you need refills please call your pharmacy. Do not use My Chart to request refills or for acute issues that need immediate attention.   Tussionex 2 times daily as needed for up to 10 days, it will not be refilled. Nasal saline irrigations for nasal/sinus congestion.  Appt with pulmonologist will be arranged. Right rib cage pain seems to be muscular, avoid shallow breathing. Monitor blood pressure at home.  Please be sure medication list is accurate. If a new problem present, please set up appointment sooner than planned today.

## 2022-06-28 NOTE — Assessment & Plan Note (Signed)
She is on Entresto 49-51 mg twice daily. Following with cardiologist.

## 2022-06-28 NOTE — Assessment & Plan Note (Signed)
This problem could be contributing to her chronic cough. Continue Flonase nasal spray daily as needed. Recommend nasal saline irrigations as needed.

## 2022-06-28 NOTE — Assessment & Plan Note (Signed)
Re-checked 130/85. BP adequately controlled. Recommend monitoring BP at home. Continue amlodipine 5 mg daily and HCTZ 25 mg daily as well as low-salt/DASH diet.  She is also on Entresto 49-51 mg twice daily.

## 2022-06-29 ENCOUNTER — Other Ambulatory Visit (INDEPENDENT_AMBULATORY_CARE_PROVIDER_SITE_OTHER): Payer: 59

## 2022-06-29 DIAGNOSIS — E876 Hypokalemia: Secondary | ICD-10-CM | POA: Diagnosis not present

## 2022-06-29 LAB — BASIC METABOLIC PANEL
BUN: 9 mg/dL (ref 6–23)
CO2: 27 mEq/L (ref 19–32)
Calcium: 8.9 mg/dL (ref 8.4–10.5)
Chloride: 104 mEq/L (ref 96–112)
Creatinine, Ser: 0.92 mg/dL (ref 0.40–1.20)
GFR: 75.41 mL/min (ref >60.00)
Glucose, Bld: 86 mg/dL (ref 70–99)
Potassium: 4.3 mEq/L (ref 3.5–5.1)
Sodium: 137 mEq/L (ref 135–145)

## 2022-07-09 LAB — GLUCOSE, POCT (MANUAL RESULT ENTRY): POC Glucose: 117 mg/dl — AB (ref 70–99)

## 2022-07-13 ENCOUNTER — Encounter: Payer: Self-pay | Admitting: Gastroenterology

## 2022-07-20 ENCOUNTER — Ambulatory Visit: Payer: 59 | Admitting: Pulmonary Disease

## 2022-07-20 ENCOUNTER — Encounter: Payer: Self-pay | Admitting: Pulmonary Disease

## 2022-07-20 VITALS — BP 116/86 | HR 56 | Temp 98.5°F | Ht 65.0 in | Wt 191.0 lb

## 2022-07-20 DIAGNOSIS — J4540 Moderate persistent asthma, uncomplicated: Secondary | ICD-10-CM

## 2022-07-20 MED ORDER — BUDESONIDE 0.5 MG/2ML IN SUSP: 0.5000 mg | Freq: Two times a day (BID) | RESPIRATORY_TRACT | 3 refills | Status: DC

## 2022-07-20 MED ORDER — FORMOTEROL FUMARATE 20 MCG/2ML IN NEBU: 20.0000 ug | INHALATION_SOLUTION | Freq: Two times a day (BID) | RESPIRATORY_TRACT | 3 refills | Status: DC
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
## 2022-07-20 NOTE — Patient Instructions (Signed)
Nice to meet you  Use budesonide and formoterol nebulized twice a day - rinse your mouth after every use  Stop Symbicort once you start these new nebulizer medicines  If the nebulizers are too expensive please send me a mychart message and we will look for a solution  Continue albuterol as needed  Return to clinic in 3 months or sooner as needed

## 2022-08-15 ENCOUNTER — Telehealth: Payer: Self-pay | Admitting: Pulmonary Disease

## 2022-08-15 MED ORDER — ALBUTEROL SULFATE (2.5 MG/3ML) 0.083% IN NEBU: 2.5000 mg | INHALATION_SOLUTION | Freq: Four times a day (QID) | RESPIRATORY_TRACT | 12 refills | Status: DC | PRN

## 2022-08-15 MED ORDER — ALBUTEROL SULFATE HFA 108 (90 BASE) MCG/ACT IN AERS: 2.0000 | INHALATION_SPRAY | Freq: Four times a day (QID) | RESPIRATORY_TRACT | 6 refills | Status: DC | PRN

## 2022-08-15 NOTE — Telephone Encounter (Signed)
Called [place] from [place] and advised her that Dr Silas Flood agreed to send in Albuterol nebs and inhaler for patient. Nothing further needed

## 2022-08-15 NOTE — Telephone Encounter (Signed)
Hey are you ok if I send in albuterol neb and inhaler in for patient. [facility] states that it did help patient a lot when she was last seen in hospital.   Or do you want to see patient for hospital follow up?  Please advise sir

## 2022-08-15 NOTE — Telephone Encounter (Signed)
Yes ok with me

## 2022-08-17 ENCOUNTER — Ambulatory Visit: Payer: 59 | Admitting: Gastroenterology

## 2022-08-17 ENCOUNTER — Encounter: Payer: Self-pay | Admitting: Gastroenterology

## 2022-08-17 VITALS — BP 136/72 | HR 72 | Ht 65.0 in | Wt 184.1 lb

## 2022-08-17 DIAGNOSIS — K5900 Constipation, unspecified: Secondary | ICD-10-CM

## 2022-08-17 DIAGNOSIS — Z1211 Encounter for screening for malignant neoplasm of colon: Secondary | ICD-10-CM | POA: Diagnosis not present

## 2022-08-17 DIAGNOSIS — K921 Melena: Secondary | ICD-10-CM | POA: Diagnosis not present

## 2022-08-17 DIAGNOSIS — R053 Chronic cough: Secondary | ICD-10-CM

## 2022-08-17 DIAGNOSIS — K219 Gastro-esophageal reflux disease without esophagitis: Secondary | ICD-10-CM

## 2022-08-17 MED ORDER — NA SULFATE-K SULFATE-MG SULF 17.5-3.13-1.6 GM/177ML PO SOLN: 1.0000 | Freq: Once | ORAL | 0 refills | Status: AC

## 2022-08-17 NOTE — Patient Instructions (Signed)
_______________________________________________________  If you are age 45 or older, your body mass index should be between 23-30. Your Body mass index is 30.64 kg/m. If this is out of the aforementioned range listed, please consider follow up with your Primary Care Provider.  If you are age 62 or younger, your body mass index should be between 19-25. Your Body mass index is 30.64 kg/m. If this is out of the aformentioned range listed, please consider follow up with your Primary Care Provider.   You have been scheduled for an endoscopy and colonoscopy. Please follow the written instructions given to you at your visit today. Please pick up your prep supplies at the pharmacy within the next 1-3 days. If you use inhalers (even only as needed), please bring them with you on the day of your procedure.   The [facility] providers would like to encourage you to use [facility] to communicate with providers for non-urgent requests or questions.  Due to long hold times on the telephone, sending your provider a message by [facility] may be a faster and more efficient way to get a response.  Please allow 48 business hours for a response.  Please remember that this is for non-urgent requests.   It was a pleasure to see you today!  Thank you for trusting me with your gastrointestinal care!    Scott E.Candis Schatz MD

## 2022-08-17 NOTE — Progress Notes (Signed)
HPI : Courtney Donaldson is a very pleasant 46 year old female with a history of asthma, hypertension and anxiety who is referred to [place] by Dr. Betty Donaldson for further evaluation of bright red blood per rectum.  The patient states that she saw blood in her stool and on the toilet paper 1 day about a month ago.  She did not have any other symptoms to accompany the bleeding to include abdominal pain, pain with the passage of stool or change in her bowel habits.  Bleeding was not profuse. She has infrequent bowel movements at baseline, typically every 3 to 4 days.  This has been her bowel frequency for most of her life. She recently underwent knee surgery and her constipation has worsened since then.  She takes Dulcolax on occasion which does work.  Her stools are typically small and hard and difficult to pass.  She frequently has to strain with bowel movements.  Diarrhea is not a typical problem for her.  Rarely has any abdominal discomfort. She has no family history of colon cancer.  She has never had a colonoscopy.  She is also bothered by chronic cough.  This has been a problem for years, but went away, but has returned since spring of this year.  She has a cough on a daily basis.  It is worse at night.  Sometimes the coughing fits are so bad that it causes vomiting.  She thinks that the coughing is from throat irritation.  She feels a frequent tickle in her throat, and has phlegm and lots of throat clearing.  She sometimes has burning sensation in her chest.  No acid regurgitation.  She tells me that her dentist told her that her enamel is worn, likely from reflux.  She sleeps with the head of bed elevated.  She does not eat within 3 to 4 hours of bedtime.  She does not smoke and rarely drinks alcohol.  She does drink ginger ale frequently.  She has been taking Protonix for several months, but does not think that it helps very much with the cough. Her weight has been stable.   Past Medical History:   Diagnosis Date   Asthma    Bronchitis 10/2013   Depression    GERD (gastroesophageal reflux disease)    High blood pressure    History of COVID-19 11/25/2019   June, 2022   Hypertension    Low vitamin D level    Right ovarian cyst 2015   Calcified cyst - possible dermoid.  Needs yearly ultrasound.   UTI (urinary tract infection)      Past Surgical History:  Procedure Laterality Date   ABDOMINAL HYSTERECTOMY     ANTERIOR CRUCIATE LIGAMENT REPAIR     CYSTOSCOPY N/A 10/21/2019   Procedure: CYSTOSCOPY;  Surgeon: Nunzio Cobbs, MD;  Location: [facility];  Service: Gynecology;  Laterality: N/A;   INTRAUTERINE DEVICE (IUD) INSERTION  04/11/2013   Mirena   LAPAROSCOPIC LYSIS OF ADHESIONS N/A 10/21/2019   Procedure: LAPAROSCOPIC LYSIS OF ADHESIONS;  Surgeon: Nunzio Cobbs, MD;  Location: [facility];  Service: Gynecology;  Laterality: N/A;   MENISCUS REPAIR     TOTAL LAPAROSCOPIC HYSTERECTOMY WITH SALPINGECTOMY Bilateral 10/21/2019   Procedure: TOTAL LAPAROSCOPIC HYSTERECTOMY WITH SALPINGECTOMY AND RIGHT OOPHORECTOMY; Surgeon: Nunzio Cobbs, MD;  Location: [facility];  Service: Gynecology;  Laterality: Bilateral;  Extended recovery bed needed   Family History  Problem Relation Age  of Onset   Diabetes Mother    Pancreatic cancer Paternal Aunt 26   Breast cancer Paternal Aunt    Diabetes Maternal Grandmother    Diabetes Maternal Grandfather    Cancer Maternal Grandfather    Obesity Maternal Grandfather    Breast cancer Paternal Grandmother 18       postmenopausal   Cancer Paternal Grandmother    Hypertension Father    Early death Father    Hyperlipidemia Father    Kidney disease Father    Depression Sister    Hypertension Sister    Heart attack Paternal Grandfather    Kidney failure Other 31   Social History   Tobacco Use   Smoking status: Never   Smokeless tobacco: Never  Vaping Use    Vaping Use: Never used  Substance Use Topics   Alcohol use: Yes    Alcohol/week: 1.0 - 2.0 standard drink of alcohol    Types: 1 - 2 Standard drinks or equivalent per week   Drug use: Not Currently   Current Outpatient Medications  Medication Sig Dispense Refill   albuterol (PROVENTIL) (2.5 MG/3ML) 0.083% nebulizer solution Take 3 mLs (2.5 mg total) by nebulization every 6 (six) hours as needed for wheezing or shortness of breath. 75 mL 12   albuterol (VENTOLIN HFA) 108 (90 Base) MCG/ACT inhaler Inhale 2 puffs into the lungs every 6 (six) hours as needed for wheezing or shortness of breath. 8 g 6   amLODipine (NORVASC) 5 MG tablet TAKE 1 TABLET (5 MG TOTAL) BY MOUTH DAILY. 90 tablet 1   budesonide (PULMICORT) 0.5 MG/2ML nebulizer solution Take 2 mLs (0.5 mg total) by nebulization 2 (two) times daily. 120 mL 3   budesonide-formoterol (SYMBICORT) 160-4.5 MCG/ACT inhaler Inhale 2 puffs into the lungs 2 (two) times daily. 1 each 2   formoterol (PERFOROMIST) 20 MCG/2ML nebulizer solution Take 2 mLs (20 mcg total) by nebulization 2 (two) times daily. 120 mL 3   hydrochlorothiazide (HYDRODIURIL) 25 MG tablet TAKE 1 TABLET (25 MG TOTAL) BY MOUTH DAILY. 90 tablet 1   montelukast (SINGULAIR) 10 MG tablet TAKE 1 TABLET BY MOUTH EVERYDAY AT BEDTIME 90 tablet 1   Naproxen Sodium (NAPRELAN) 750 MG TB24 Take 1 tablet by mouth as needed (pain).     pantoprazole (PROTONIX) 40 MG tablet TAKE 1 TABLET BY MOUTH EVERY DAY 90 tablet 1   potassium chloride SA (KLOR-CON M) 20 MEQ tablet Take 1 tablet (20 mEq total) by mouth daily. 10 tablet 0   sacubitril-valsartan (ENTRESTO) 49-51 MG Take 1 tablet by mouth 2 (two) times daily. 60 tablet 11   albuterol (VENTOLIN HFA) 108 (90 Base) MCG/ACT inhaler Inhale 2 puffs into the lungs every 6 (six) hours as needed for wheezing or shortness of breath. 8 g 0   No current facility-administered medications for this visit.   No Known Allergies   Review of Systems: All  systems reviewed and negative except where noted in HPI.    No results found.  Physical Exam: BP 136/72   Pulse 72   Ht '5\' 5"'$  (1.651 m)   Wt 184 lb 2 oz (83.5 kg)   LMP 10/17/2019   BMI 30.64 kg/m  Constitutional: Pleasant,well-developed, African-American female in no acute distress.  Accompanied by husband HEENT: Normocephalic and atraumatic. Conjunctivae are normal. No scleral icterus. Cardiovascular: Normal rate, regular rhythm.  Pulmonary/chest: Effort normal and breath sounds normal. No wheezing, rales or rhonchi. Abdominal: Soft, nondistended, nontender. Bowel sounds active throughout. There are no  masses palpable. No hepatomegaly. Extremities: Left knee in immobilization brace Neurological: Alert and oriented to person place and time. Skin: Skin is warm and dry. No rashes noted. Psychiatric: Normal mood and affect. Behavior is normal.  CBC    Component Value Date/Time   WBC 6.6 06/14/2022 2314   RBC 3.99 06/14/2022 2314   HGB 12.2 06/14/2022 2314   HGB 13.1 07/01/2020 1044   HGB 12.7 02/22/2016 1547   HCT 37.1 06/14/2022 2314   HCT 39.3 07/01/2020 1044   PLT 304 06/14/2022 2314   PLT 342 07/01/2020 1044   MCV 93.0 06/14/2022 2314   MCV 93 07/01/2020 1044   MCH 30.6 06/14/2022 2314   MCHC 32.9 06/14/2022 2314   RDW 12.8 06/14/2022 2314   RDW 12.4 07/01/2020 1044   LYMPHSABS 1,109 02/11/2022 0835   MONOABS 0.5 11/15/2019 1134   EOSABS 72 02/11/2022 0835   BASOSABS 29 02/11/2022 0835    CMP     Component Value Date/Time   NA 137 06/29/2022 1234   NA 137 04/05/2022 1350   K 4.3 06/29/2022 1234   CL 104 06/29/2022 1234   CO2 27 06/29/2022 1234   GLUCOSE 86 06/29/2022 1234   BUN 9 06/29/2022 1234   BUN 15 04/05/2022 1350   CREATININE 0.92 06/29/2022 1234   CREATININE 0.84 02/11/2022 0835   CALCIUM 8.9 06/29/2022 1234   PROT 7.3 07/30/2021 1006   PROT 7.4 07/01/2020 1044   ALBUMIN 4.5 07/01/2020 1044   AST 11 07/30/2021 1006   ALT 9 07/30/2021 1006    ALKPHOS 82 07/01/2020 1044   BILITOT 0.4 07/30/2021 1006   BILITOT 0.5 07/01/2020 1044   GFRNONAA >60 06/14/2022 2314   GFRAA 101 07/01/2020 1044     ASSESSMENT AND PLAN: 46 year old female with single episode of painless bright red blood per rectum 1 month ago, no recurrence is in no other concerning GI symptoms.  She does have chronic constipation with small hard stools and straining with defecation.  Her episode of hematochezia is almost certainly secondary to internal hemorrhoids.  I recommended the patient start taking Metamucil on a daily basis to improve her stool bulk and consistency and reduce straining.  This should reduce the likelihood of further episodes of hemorrhoidal bleeding. The patient is due for her initial average risk screening colonoscopy.  We can definitively rule out any other causes of hematochezia at that time.  With regards to her cough, it is very possible that her symptoms are reflux related given the element of throat irritation, phlegm and exacerbation in supine position.  We discussed the pathophysiology of GERD and the principles of GERD management.  The patient is already engaging in meeting the recommended behaviors to include elevating head of bed and avoiding meals within 3 to 4 hours of bedtime.  I recommended that she limit consumption of carbonated beverages and peppermint/chewing gum, as these may worsen her symptoms.  We will perform upper endoscopy to assess her anatomy and for objective evidence of reflux.  If unremarkable, we may need to consider impedance testing to further assess the role of GERD in her cough symptoms.  Colon cancer screening - Colonoscopy  Hematochezia, 1 episode, likely hemorrhoidal -Daily Metamucil - Colonoscopy will definitively exclude other causes of hematochezia  Constipation - Start daily Metamucil, will likely require additional medications  Cough, suspect GERD - EGD - Continue Protonix for now - Reviewed behavioral  modifications to lessen GERD symptoms  We will plan to do these procedures at least  6 weeks out given the patient's recent knee surgery.  The details, risks (including bleeding, perforation, infection, missed lesions, medication reactions and possible hospitalization or surgery if complications occur), benefits, and alternatives to EGD/colonoscopy with possible biopsy and possible polypectomy were discussed with the patient and she consents to proceed.   Jaydeen Odor E. Candis Schatz, MD [facility]   Donaldson, Courtney G, MD

## 2022-09-06 ENCOUNTER — Ambulatory Visit: Payer: 59 | Admitting: Critical Care Medicine

## 2022-09-09 NOTE — Progress Notes (Signed)
Cardiology Office Note:    Date:  09/12/2022   ID:  Dodie, Parisi [birth year], MRN [id number]  PCP:  Martinique, Betty G, MD  Cardiologist:  Sinclair Grooms, MD   Referring MD: Martinique, Betty G, MD   Chief Complaint  Patient presents with   Congestive Heart Failure   Hypertension    History of Present Illness:    Courtney Donaldson is a 46 y.o. female with a hx of  primary hypertension, depression, GERD, chest tightness, leg swelling, and SOB since 01/22/2022, and recent work injury requiring arthroscopic left knee reconstruction.    Difficult coughing.  This was going on prior to being prescribed Tussionex.  Exertional dyspnea and shortness of breath is improved.  No particular medication side effects.  Noted that she has not experience side effects from her current medical regimen.  Past Medical History:  Diagnosis Date   Asthma    Bronchitis 10/2013   Depression    GERD (gastroesophageal reflux disease)    High blood pressure    History of COVID-19 11/25/2019   June, 2022   Hypertension    Low vitamin D level    Right ovarian cyst 2015   Calcified cyst - possible dermoid.  Needs yearly ultrasound.   UTI (urinary tract infection)     Past Surgical History:  Procedure Laterality Date   ABDOMINAL HYSTERECTOMY     ANTERIOR CRUCIATE LIGAMENT REPAIR     CYSTOSCOPY N/A 10/21/2019   Procedure: CYSTOSCOPY;  Surgeon: Nunzio Cobbs, MD;  Location: [facility];  Service: Gynecology;  Laterality: N/A;   INTRAUTERINE DEVICE (IUD) INSERTION  04/11/2013   Mirena   LAPAROSCOPIC LYSIS OF ADHESIONS N/A 10/21/2019   Procedure: LAPAROSCOPIC LYSIS OF ADHESIONS;  Surgeon: Nunzio Cobbs, MD;  Location: [facility];  Service: Gynecology;  Laterality: N/A;   MENISCUS REPAIR     TOTAL LAPAROSCOPIC HYSTERECTOMY WITH SALPINGECTOMY Bilateral 10/21/2019   Procedure: TOTAL LAPAROSCOPIC HYSTERECTOMY WITH SALPINGECTOMY AND  RIGHT OOPHORECTOMY; Surgeon: Nunzio Cobbs, MD;  Location: [facility];  Service: Gynecology;  Laterality: Bilateral;  Extended recovery bed needed    Current Medications: Current Meds  Medication Sig   albuterol (PROVENTIL) (2.5 MG/3ML) 0.083% nebulizer solution Take 3 mLs (2.5 mg total) by nebulization every 6 (six) hours as needed for wheezing or shortness of breath.   albuterol (VENTOLIN HFA) 108 (90 Base) MCG/ACT inhaler Inhale 2 puffs into the lungs every 6 (six) hours as needed for wheezing or shortness of breath.   albuterol (VENTOLIN HFA) 108 (90 Base) MCG/ACT inhaler Inhale 2 puffs into the lungs every 6 (six) hours as needed for wheezing or shortness of breath.   amLODipine (NORVASC) 5 MG tablet TAKE 1 TABLET (5 MG TOTAL) BY MOUTH DAILY.   budesonide (PULMICORT) 0.5 MG/2ML nebulizer solution Take 2 mLs (0.5 mg total) by nebulization 2 (two) times daily.   budesonide-formoterol (SYMBICORT) 160-4.5 MCG/ACT inhaler Inhale 2 puffs into the lungs 2 (two) times daily.   formoterol (PERFOROMIST) 20 MCG/2ML nebulizer solution Take 2 mLs (20 mcg total) by nebulization 2 (two) times daily.   hydrochlorothiazide (HYDRODIURIL) 25 MG tablet TAKE 1 TABLET (25 MG TOTAL) BY MOUTH DAILY.   montelukast (SINGULAIR) 10 MG tablet TAKE 1 TABLET BY MOUTH EVERYDAY AT BEDTIME   Naproxen Sodium (NAPRELAN) 750 MG TB24 Take 1 tablet by mouth as needed (pain).   oxyCODONE (OXY IR/ROXICODONE) 5 MG immediate release tablet Take  5 mg by mouth every 6 (six) hours as needed.   pantoprazole (PROTONIX) 40 MG tablet TAKE 1 TABLET BY MOUTH EVERY DAY   potassium chloride SA (KLOR-CON M) 20 MEQ tablet Take 1 tablet (20 mEq total) by mouth daily.   sacubitril-valsartan (ENTRESTO) 49-51 MG Take 1 tablet by mouth 2 (two) times daily.     Allergies:   Patient has no known allergies.   Social History   Socioeconomic History   Marital status: Single    Spouse name: Not on file   Number of  children: 0   Years of education: Not on file   Highest education level: Associate degree: occupational, Hotel manager, or vocational program  Occupational History   Not on file  Tobacco Use   Smoking status: Never   Smokeless tobacco: Never  Vaping Use   Vaping Use: Never used  Substance and Sexual Activity   Alcohol use: Yes    Alcohol/week: 1.0 - 2.0 standard drink of alcohol    Types: 1 - 2 Standard drinks or equivalent per week   Drug use: Not Currently   Sexual activity: Yes    Partners: Male  Other Topics Concern   Not on file  Social History Narrative   Not on file   Social Determinants of Health   Financial Resource Strain: Low Risk  (02/09/2022)   Overall Financial Resource Strain (CARDIA)    Difficulty of Paying Living Expenses: Not hard at all  Food Insecurity: No Food Insecurity (02/09/2022)   Hunger Vital Sign    Worried About Running Out of Food in the Last Year: Never true    [place] in the Last Year: Never true  Transportation Needs: No Transportation Needs (02/09/2022)   PRAPARE - Hydrologist (Medical): No    Lack of Transportation (Non-Medical): No  Physical Activity: Sufficiently Active (02/09/2022)   Exercise Vital Sign    Days of Exercise per Week: 3 days    Minutes of Exercise per Session: 60 min  Stress: No Stress Concern Present (02/09/2022)   [place]    Feeling of Stress : Not at all  Social Connections: Moderately Integrated (02/09/2022)   Social Connection and Isolation Panel [NHANES]    Frequency of Communication with Friends and Family: More than three times a week    Frequency of Social Gatherings with Friends and Family: More than three times a week    Attends Religious Services: More than 4 times per year    Active Member of [employer] or Organizations: Yes    Attends Music therapist: More than 4 times per year    Marital Status:  Never married     Family History: The patient's family history includes Breast cancer in her paternal aunt; Breast cancer (age of onset: 67) in her paternal grandmother; Cancer in her maternal grandfather and paternal grandmother; Depression in her sister; Diabetes in her maternal grandfather, maternal grandmother, and mother; Early death in her father; Heart attack in her paternal grandfather; Hyperlipidemia in her father; Hypertension in her father and sister; Kidney disease in her father; Kidney failure (age of onset: 44) in an other family member; Obesity in her maternal grandfather; Pancreatic cancer (age of onset: 60) in her paternal aunt.  ROS:   Please see the history of present illness.    Cough but no wheezing.  Cough is aggravated by smoke.  This has been a longstanding problem.  She is status post left knee arthroscopic repair.  Still in rehab.  All other systems reviewed and are negative.  EKGs/Labs/Other Studies Reviewed:    The following studies were reviewed today:  ECHOCARDIOGRAPHY 02/2022: IMPRESSIONS   1. Left ventricular ejection fraction, by estimation, is 50 to 55%. Left  ventricular ejection fraction by 2D MOD biplane is 49.5 %. The left  ventricle has low normal function. The left ventricle has no regional wall  motion abnormalities. The left  ventricular internal cavity size was mildly dilated. Left ventricular  diastolic parameters were normal.   2. Right ventricular systolic function is normal. The right ventricular  size is normal.   3. Left atrial size was mild to moderately dilated.   4. The mitral valve is normal in structure. Mild mitral valve  regurgitation. No evidence of mitral stenosis.   5. The aortic valve is normal in structure. Aortic valve regurgitation is  not visualized. No aortic stenosis is present.   6. The inferior vena cava is normal in size with greater than 50%  respiratory variability, suggesting right atrial pressure of 3 mmHg.   EKG:   EKG not repeated  Recent Labs: 04/05/2022: NT-Pro BNP <36 06/14/2022: Hemoglobin 12.2; Platelets 304 06/29/2022: BUN 9; Creatinine, Ser 0.92; Potassium 4.3; Sodium 137  Recent Lipid Panel    Component Value Date/Time   CHOL 209 (H) 07/30/2021 1006   CHOL 223 (H) 07/01/2020 1044   TRIG 76 07/30/2021 1006   HDL 69 07/30/2021 1006   HDL 79 07/01/2020 1044   CHOLHDL 3.0 07/30/2021 1006   VLDL 16 02/27/2017 1641   LDLCALC 123 (H) 07/30/2021 1006    Physical Exam:    VS:  BP 122/88   Pulse 72   Ht '5\' 5"'$  (1.651 m)   Wt 184 lb (83.5 kg)   LMP 10/17/2019   SpO2 99%   BMI 30.62 kg/m     Wt Readings from Last 3 Encounters:  09/12/22 184 lb (83.5 kg)  08/17/22 184 lb 2 oz (83.5 kg)  07/20/22 191 lb (86.6 kg)     GEN: Overweight. No acute distress HEENT: Normal NECK: No JVD. LYMPHATICS: No lymphadenopathy CARDIAC: No murmur. RRR no gallop, or edema. VASCULAR:  Normal Pulses. No bruits. RESPIRATORY:  Clear to auscultation without rales, wheezing or rhonchi  ABDOMEN: Soft, non-tender, non-distended, No pulsatile mass, MUSCULOSKELETAL: No deformity  SKIN: Warm and dry NEUROLOGIC:  Alert and oriented x 3 PSYCHIATRIC:  Normal affect   ASSESSMENT:    1. Shortness of breath   2. Hypertension, essential, benign   3. Hyperlipidemia, unspecified hyperlipidemia type   4. Class 2 obesity due to excess calories without serious comorbidity with body mass index (BMI) of 35.0 to 35.9 in adult    PLAN:    In order of problems listed above:  Improved since starting Entresto.  Mid range reduced LVEF noted on echo, will need follow-up echocardiogram in April. Improved on Entresto. Not being treated.  Coronary calcium score will be done. Working on her weight with diet.   Guideline directed therapy for left ventricular systolic dysfunction: Angiotensin receptor-neprilysin inhibitor (ARNI)-Entresto; beta-blocker therapy - carvedilol, metoprolol succinate, or bisoprolol; mineralocorticoid  receptor antagonist (MRA) therapy -spironolactone or eplerenone.  SGLT-2 agents -  Dapagliflozin Wilder Glade) or Empagliflozin (Jardiance).These therapies have been shown to improve clinical outcomes including reduction of rehospitalization, survival, and acute heart failure.    Medication Adjustments/Labs and Tests Ordered: Current medicines are reviewed at length with the patient today.  Concerns regarding  medicines are outlined above.  Orders Placed This Encounter  Procedures   CT CARDIAC SCORING   ECHOCARDIOGRAM COMPLETE   No orders of the defined types were placed in this encounter.   Patient Instructions  Medication Instructions:  Your physician recommends that you continue on your current medications as directed. Please refer to the Current Medication list given to you today.  *If you need a refill on your cardiac medications before your next appointment, please call your pharmacy*  Lab Work: NONE  Testing/Procedures: Your physician has requested that you have a coronary calcium score performed in April 2024.  Your physician has requested that you have an echocardiogram in April 2024. Echocardiography is a painless test that uses sound waves to create images of your heart. It provides your doctor with information about the size and shape of your heart and how well your heart's chambers and valves are working. This procedure takes approximately one hour. There are no restrictions for this procedure. Please do NOT wear cologne, perfume, aftershave, or lotions (deodorant is allowed). Please arrive 15 minutes prior to your appointment time.  Follow-Up: At [facility], you and your health needs are our priority.  As part of our continuing mission to provide you with exceptional heart care, we have created designated Provider Care Teams.  These Care Teams include your primary Cardiologist (physician) and Advanced Practice Providers (APPs -  Physician Assistants and Nurse  Practitioners) who all work together to provide you with the care you need, when you need it.  Your next appointment:   6 month(s)  The format for your next appointment:   In Person  Provider:   Skeet Latch, MD  Important Information About Sugar         Signed, Sinclair Grooms, MD  09/12/2022 9:54 AM    [place]

## 2022-09-12 ENCOUNTER — Telehealth: Payer: Self-pay | Admitting: Family Medicine

## 2022-09-12 ENCOUNTER — Encounter: Payer: Self-pay | Admitting: Interventional Cardiology

## 2022-09-12 ENCOUNTER — Ambulatory Visit: Payer: 59 | Attending: Interventional Cardiology | Admitting: Interventional Cardiology

## 2022-09-12 VITALS — BP 122/88 | HR 72 | Ht 65.0 in | Wt 184.0 lb

## 2022-09-12 DIAGNOSIS — R0602 Shortness of breath: Secondary | ICD-10-CM

## 2022-09-12 DIAGNOSIS — E785 Hyperlipidemia, unspecified: Secondary | ICD-10-CM

## 2022-09-12 DIAGNOSIS — E6609 Other obesity due to excess calories: Secondary | ICD-10-CM

## 2022-09-12 DIAGNOSIS — I10 Essential (primary) hypertension: Secondary | ICD-10-CM

## 2022-09-12 DIAGNOSIS — Z6835 Body mass index (BMI) 35.0-35.9, adult: Secondary | ICD-10-CM

## 2022-09-12 NOTE — Patient Instructions (Addendum)
Medication Instructions:  Your physician recommends that you continue on your current medications as directed. Please refer to the Current Medication list given to you today.  *If you need a refill on your cardiac medications before your next appointment, please call your pharmacy*  Lab Work: NONE  Testing/Procedures: Your physician has requested that you have a coronary calcium score performed in April 2024.  Your physician has requested that you have an echocardiogram in April 2024. Echocardiography is a painless test that uses sound waves to create images of your heart. It provides your doctor with information about the size and shape of your heart and how well your heart's chambers and valves are working. This procedure takes approximately one hour. There are no restrictions for this procedure. Please do NOT wear cologne, perfume, aftershave, or lotions (deodorant is allowed). Please arrive 15 minutes prior to your appointment time.  Follow-Up: At [facility], you and your health needs are our priority.  As part of our continuing mission to provide you with exceptional heart care, we have created designated Provider Care Teams.  These Care Teams include your primary Cardiologist (physician) and Advanced Practice Providers (APPs -  Physician Assistants and Nurse Practitioners) who all work together to provide you with the care you need, when you need it.  Your next appointment:   6 month(s)  The format for your next appointment:   In Person  Provider:   Skeet Latch, MD  Important Information About Sugar

## 2022-09-12 NOTE — Telephone Encounter (Signed)
Needs appointment

## 2022-09-12 NOTE — Telephone Encounter (Signed)
Pt called to request a refill of the cough syrup MD "prescribed last time". Pt cannot remember the name, but knows it is a controlled substance.  LOV:  06/28/22  [id number] [place] [address] [place]: [phone] Fax: [phone]

## 2022-09-12 NOTE — Telephone Encounter (Signed)
LVM for Pt to call back to confirm the 4:30pm VV tomorrow afternoon

## 2022-09-13 ENCOUNTER — Telehealth (INDEPENDENT_AMBULATORY_CARE_PROVIDER_SITE_OTHER): Payer: 59 | Admitting: Family Medicine

## 2022-09-13 ENCOUNTER — Encounter: Payer: Self-pay | Admitting: Family Medicine

## 2022-09-13 VITALS — Ht 65.0 in

## 2022-09-13 DIAGNOSIS — J4520 Mild intermittent asthma, uncomplicated: Secondary | ICD-10-CM

## 2022-09-13 DIAGNOSIS — K219 Gastro-esophageal reflux disease without esophagitis: Secondary | ICD-10-CM

## 2022-09-13 DIAGNOSIS — R053 Chronic cough: Secondary | ICD-10-CM | POA: Diagnosis not present

## 2022-09-13 MED ORDER — HYDROCOD POLI-CHLORPHE POLI ER 10-8 MG/5ML PO SUER: 5.0000 mL | Freq: Two times a day (BID) | ORAL | 0 refills | Status: AC | PRN

## 2022-09-13 NOTE — Assessment & Plan Note (Signed)
Despite of treatment with albuterol and Pulmicort neb treatments she still having cough. No changes in current management. Following with pulmonologist.

## 2022-09-13 NOTE — Assessment & Plan Note (Addendum)
We reviewed possible etiologies. GERD, allergies, asthma, and Entresto can all be contributing factors. She is requesting refills on Tussionex, she understands that I will not refill this medication in the future, explained that it is not supposed to be taken for long periods of time. Refill Tussionex sent to her pharmacy to take mainly at bedtime 5 mL at the time. Continue monitoring cough and consider alternative treatments if cough persists after Tussionex is no longer available. She denies taking oxycodone and is aware of the potential risks associated with controlled medications.
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
## 2022-09-13 NOTE — Assessment & Plan Note (Signed)
This could be a contributing factor for cough. Continue Protonix 40 mg daily and GERD precautions. Pending EGD in 09/2022. Following with GI.

## 2022-10-11 ENCOUNTER — Encounter: Payer: Self-pay | Admitting: Gastroenterology

## 2022-10-15 ENCOUNTER — Other Ambulatory Visit: Payer: Self-pay | Admitting: Pulmonary Disease

## 2022-10-15 DIAGNOSIS — J4540 Moderate persistent asthma, uncomplicated: Secondary | ICD-10-CM

## 2022-10-18 ENCOUNTER — Encounter: Payer: Self-pay | Admitting: Gastroenterology

## 2022-10-18 ENCOUNTER — Ambulatory Visit (AMBULATORY_SURGERY_CENTER): Payer: 59 | Admitting: Gastroenterology

## 2022-10-18 VITALS — BP 107/69 | HR 71 | Temp 98.6°F | Resp 20 | Ht 65.0 in | Wt 184.0 lb

## 2022-10-18 DIAGNOSIS — D125 Benign neoplasm of sigmoid colon: Secondary | ICD-10-CM

## 2022-10-18 DIAGNOSIS — Z1211 Encounter for screening for malignant neoplasm of colon: Secondary | ICD-10-CM | POA: Diagnosis present

## 2022-10-18 DIAGNOSIS — R053 Chronic cough: Secondary | ICD-10-CM

## 2022-10-18 DIAGNOSIS — K219 Gastro-esophageal reflux disease without esophagitis: Secondary | ICD-10-CM

## 2022-10-18 MED ORDER — SODIUM CHLORIDE 0.9 % IV SOLN: 500.0000 mL | Freq: Once | INTRAVENOUS | Status: AC

## 2022-10-18 NOTE — Progress Notes (Signed)
[facility] History and Physical   Primary Care Physician:  Martinique, Betty G, MD   Reason for Procedure:   Chronic cough, suspected GERD, colon cancer screening  Plan:    EGD, colonoscopy     HPI: Courtney Donaldson is a 46 y.o. female undergoing initial average risk screening colonoscopy.  She had a single episode of painless hematochezia many months ago and has chronic constipation, but otherwise no chronic lower GI symptoms. She has a history of typical GERD symptoms which are well controlled with once daily Protonix, but has a chronic cough associated with post-tussive emesis which has not improved with Protonix.  She has no family history of colon cancer.   Past Medical History:  Diagnosis Date   Asthma    Bronchitis 10/2013   Depression    GERD (gastroesophageal reflux disease)    High blood pressure    History of COVID-19 11/25/2019   June, 2022   Hypertension    Low vitamin D level    Right ovarian cyst 2015   Calcified cyst - possible dermoid.  Needs yearly ultrasound.   UTI (urinary tract infection)     Past Surgical History:  Procedure Laterality Date   ABDOMINAL HYSTERECTOMY     ANTERIOR CRUCIATE LIGAMENT REPAIR     CYSTOSCOPY N/A 10/21/2019   Procedure: CYSTOSCOPY;  Surgeon: Nunzio Cobbs, MD;  Location: [facility];  Service: Gynecology;  Laterality: N/A;   INTRAUTERINE DEVICE (IUD) INSERTION  04/11/2013   Mirena   LAPAROSCOPIC LYSIS OF ADHESIONS N/A 10/21/2019   Procedure: LAPAROSCOPIC LYSIS OF ADHESIONS;  Surgeon: Nunzio Cobbs, MD;  Location: [facility];  Service: Gynecology;  Laterality: N/A;   MENISCUS REPAIR     TOTAL LAPAROSCOPIC HYSTERECTOMY WITH SALPINGECTOMY Bilateral 10/21/2019   Procedure: TOTAL LAPAROSCOPIC HYSTERECTOMY WITH SALPINGECTOMY AND RIGHT OOPHORECTOMY; Surgeon: Nunzio Cobbs, MD;  Location: [facility];  Service: Gynecology;  Laterality:  Bilateral;  Extended recovery bed needed    Prior to Admission medications   Medication Sig Start Date End Date Taking? Authorizing Provider  albuterol (PROVENTIL) (2.5 MG/3ML) 0.083% nebulizer solution Take 3 mLs (2.5 mg total) by nebulization every 6 (six) hours as needed for wheezing or shortness of breath. 08/15/22  Yes Hunsucker, Bonna Gains, MD  albuterol (VENTOLIN HFA) 108 (90 Base) MCG/ACT inhaler Inhale 2 puffs into the lungs every 6 (six) hours as needed for wheezing or shortness of breath. 02/11/22 10/18/22 Yes Martinique, Betty G, MD  albuterol (VENTOLIN HFA) 108 (90 Base) MCG/ACT inhaler Inhale 2 puffs into the lungs every 6 (six) hours as needed for wheezing or shortness of breath. 08/15/22  Yes Hunsucker, Bonna Gains, MD  amLODipine (NORVASC) 5 MG tablet TAKE 1 TABLET (5 MG TOTAL) BY MOUTH DAILY. 04/04/22  Yes Martinique, Betty G, MD  budesonide (PULMICORT) 0.5 MG/2ML nebulizer solution TAKE 2 ML (0.5 MG TOTAL) BY NEBULIZATION TWICE A DAY 10/17/22  Yes Hunsucker, Bonna Gains, MD  budesonide-formoterol (SYMBICORT) 160-4.5 MCG/ACT inhaler Inhale 2 puffs into the lungs 2 (two) times daily. 05/06/22  Yes Martinique, Betty G, MD  hydrochlorothiazide (HYDRODIURIL) 25 MG tablet TAKE 1 TABLET (25 MG TOTAL) BY MOUTH DAILY. 04/04/22  Yes Martinique, Betty G, MD  methocarbamol (ROBAXIN) 500 MG tablet Take 2 tablets 4 times a day by oral route. 09/20/22  Yes [provider]  montelukast (SINGULAIR) 10 MG tablet TAKE 1 TABLET BY MOUTH EVERYDAY AT BEDTIME 06/27/22  Yes Martinique, Betty  G, MD  pantoprazole (PROTONIX) 40 MG tablet TAKE 1 TABLET BY MOUTH EVERY DAY 06/27/22  Yes [place], Betty G, MD  sacubitril-valsartan (ENTRESTO) 49-51 MG Take 1 tablet by mouth 2 (two) times daily. 04/26/22  Yes Belva Crome, MD  formoterol (PERFOROMIST) 20 MCG/2ML nebulizer solution Take 2 mLs (20 mcg total) by nebulization 2 (two) times daily. 07/20/22   Hunsucker, Bonna Gains, MD  Naproxen Sodium (NAPRELAN) 750 MG TB24 Take 1 tablet by mouth  as needed (pain).    [provider]  ondansetron (ZOFRAN) 4 MG tablet Take 1 tablet by mouth every 8 (eight) hours as needed.    [provider]  oxyCODONE (OXY IR/ROXICODONE) 5 MG immediate release tablet Take 5 mg by mouth every 6 (six) hours as needed. 08/23/22   [provider]  potassium chloride SA (KLOR-CON M) 20 MEQ tablet Take 1 tablet (20 mEq total) by mouth daily. 06/15/22   Horton, Barbette Hair, MD    Current Outpatient Medications  Medication Sig Dispense Refill   albuterol (PROVENTIL) (2.5 MG/3ML) 0.083% nebulizer solution Take 3 mLs (2.5 mg total) by nebulization every 6 (six) hours as needed for wheezing or shortness of breath. 75 mL 12   albuterol (VENTOLIN HFA) 108 (90 Base) MCG/ACT inhaler Inhale 2 puffs into the lungs every 6 (six) hours as needed for wheezing or shortness of breath. 8 g 0   albuterol (VENTOLIN HFA) 108 (90 Base) MCG/ACT inhaler Inhale 2 puffs into the lungs every 6 (six) hours as needed for wheezing or shortness of breath. 8 g 6   amLODipine (NORVASC) 5 MG tablet TAKE 1 TABLET (5 MG TOTAL) BY MOUTH DAILY. 90 tablet 1   budesonide (PULMICORT) 0.5 MG/2ML nebulizer solution TAKE 2 ML (0.5 MG TOTAL) BY NEBULIZATION TWICE A DAY 360 mL 1   budesonide-formoterol (SYMBICORT) 160-4.5 MCG/ACT inhaler Inhale 2 puffs into the lungs 2 (two) times daily. 1 each 2   hydrochlorothiazide (HYDRODIURIL) 25 MG tablet TAKE 1 TABLET (25 MG TOTAL) BY MOUTH DAILY. 90 tablet 1   methocarbamol (ROBAXIN) 500 MG tablet Take 2 tablets 4 times a day by oral route.     montelukast (SINGULAIR) 10 MG tablet TAKE 1 TABLET BY MOUTH EVERYDAY AT BEDTIME 90 tablet 1   pantoprazole (PROTONIX) 40 MG tablet TAKE 1 TABLET BY MOUTH EVERY DAY 90 tablet 1   sacubitril-valsartan (ENTRESTO) 49-51 MG Take 1 tablet by mouth 2 (two) times daily. 60 tablet 11   formoterol (PERFOROMIST) 20 MCG/2ML nebulizer solution Take 2 mLs (20 mcg total) by nebulization 2 (two) times daily. 120 mL 3    Naproxen Sodium (NAPRELAN) 750 MG TB24 Take 1 tablet by mouth as needed (pain).     ondansetron (ZOFRAN) 4 MG tablet Take 1 tablet by mouth every 8 (eight) hours as needed.     oxyCODONE (OXY IR/ROXICODONE) 5 MG immediate release tablet Take 5 mg by mouth every 6 (six) hours as needed.     potassium chloride SA (KLOR-CON M) 20 MEQ tablet Take 1 tablet (20 mEq total) by mouth daily. 10 tablet 0   Current Facility-Administered Medications  Medication Dose Route Frequency Provider Last Rate Last Admin   0.9 %  sodium chloride infusion  500 mL Intravenous Once Daryel November, MD        Allergies as of 10/18/2022   (No Known Allergies)    Family History  Problem Relation Age of Onset   Diabetes Mother    Pancreatic cancer Paternal Aunt 35  Breast cancer Paternal Aunt    Diabetes Maternal Grandmother    Diabetes Maternal Grandfather    Cancer Maternal Grandfather    Obesity Maternal Grandfather    Breast cancer Paternal Grandmother 2       postmenopausal   Cancer Paternal Grandmother    Hypertension Father    Early death Father    Hyperlipidemia Father    Kidney disease Father    Depression Sister    Hypertension Sister    Heart attack Paternal Grandfather    Kidney failure Other 61    Social History   Socioeconomic History   Marital status: Single    Spouse name: Not on file   Number of children: 0   Years of education: Not on file   Highest education level: Associate degree: occupational, Hotel manager, or vocational program  Occupational History   Not on file  Tobacco Use   Smoking status: Never   Smokeless tobacco: Never  Vaping Use   Vaping Use: Never used  Substance and Sexual Activity   Alcohol use: Yes    Alcohol/week: 1.0 - 2.0 standard drink of alcohol    Types: 1 - 2 Standard drinks or equivalent per week   Drug use: Not Currently   Sexual activity: Yes    Partners: Male  Other Topics Concern   Not on file  Social History Narrative   Not on file    Social Determinants of Health   Financial Resource Strain: Low Risk  (02/09/2022)   Overall Financial Resource Strain (CARDIA)    Difficulty of Paying Living Expenses: Not hard at all  Food Insecurity: No Food Insecurity (02/09/2022)   Hunger Vital Sign    Worried About Running Out of Food in the Last Year: Never true    [place] in the Last Year: Never true  Transportation Needs: No Transportation Needs (02/09/2022)   PRAPARE - Hydrologist (Medical): No    Lack of Transportation (Non-Medical): No  Physical Activity: Sufficiently Active (02/09/2022)   Exercise Vital Sign    Days of Exercise per Week: 3 days    Minutes of Exercise per Session: 60 min  Stress: No Stress Concern Present (02/09/2022)   [place]    Feeling of Stress : Not at all  Social Connections: Moderately Integrated (02/09/2022)   Social Connection and Isolation Panel [NHANES]    Frequency of Communication with Friends and Family: More than three times a week    Frequency of Social Gatherings with Friends and Family: More than three times a week    Attends Religious Services: More than 4 times per year    Active Member of [employer] or Organizations: Yes    Attends Music therapist: More than 4 times per year    Marital Status: Never married  Intimate Partner Violence: Not on file    Review of Systems:  All other review of systems negative except as mentioned in the HPI.  Physical Exam: Vital signs BP 130/89   Pulse 78   Temp 98.6 F (37 C) (Temporal)   Ht '5\' 5"'$  (1.651 m)   Wt 184 lb (83.5 kg)   LMP 10/17/2019   SpO2 98%   BMI 30.62 kg/m   General:   Alert,  Well-developed, well-nourished, pleasant and cooperative in NAD Airway:  Mallampati 1 Lungs:  Clear throughout to auscultation.   Heart:  Regular rate and rhythm; no murmurs, clicks, rubs,  or gallops. Abdomen:  Soft, nontender and  nondistended. Normal bowel sounds.   Neuro/Psych:  Normal mood and affect. A and O x 3   Melah Ebling E. Candis Schatz, MD [facility]

## 2022-10-18 NOTE — Progress Notes (Signed)
Called to room to assist during endoscopic procedure.  Patient ID and intended procedure confirmed with present staff. Received instructions for my participation in the procedure from the performing physician.  

## 2022-10-18 NOTE — Progress Notes (Signed)
VS completed by DT.  Pt's states no medical or surgical changes since previsit or office visit.  

## 2022-10-18 NOTE — Op Note (Signed)
[place] Patient Name: Courtney Donaldson Procedure Date: 10/18/2022 3:01 PM MRN: [id number] Endoscopist: Nicki Reaper E. Candis Schatz , MD, [id number] Age: 46 Referring MD:  Date of Birth: [birth year] Gender: Female Account #: [id number] Procedure:                Upper GI endoscopy Indications:              Chronic cough Medicines:                Monitored Anesthesia Care Procedure:                Pre-Anesthesia Assessment:                           - Prior to the procedure, a History and Physical                            was performed, and patient medications and                            allergies were reviewed. The patient's tolerance of                            previous anesthesia was also reviewed. The risks                            and benefits of the procedure and the sedation                            options and risks were discussed with the patient.                            All questions were answered, and informed consent                            was obtained. Prior Anticoagulants: The patient has                            taken no anticoagulant or antiplatelet agents. ASA                            Grade Assessment: II - A patient with mild systemic                            disease. After reviewing the risks and benefits,                            the patient was deemed in satisfactory condition to                            undergo the procedure.                           After obtaining informed consent, the endoscope was  passed under direct vision. Throughout the                            procedure, the patient's blood pressure, pulse, and                            oxygen saturations were monitored continuously. The                            Endoscope was introduced through the mouth, and                            advanced to the second part of duodenum. The upper                            GI endoscopy was accomplished  without difficulty.                            The patient tolerated the procedure fairly well. Scope In: Scope Out: Findings:                 The examined portions of the nasopharynx,                            oropharynx and larynx were normal.                           One tongue of salmon-colored mucosa was present at                            35 cm. No other visible abnormalities were present.                            Biopsies were taken with a cold forceps for                            histology. Estimated blood loss was minimal.                           The exam of the esophagus was otherwise normal.                           A 6 cm hiatal hernia was present.                           The exam of the stomach was otherwise normal.                           The examined duodenum was normal. Complications:            No immediate complications. Estimated Blood Loss:     Estimated blood loss: none. Impression:               - The examined portions of the nasopharynx,  oropharynx and larynx were normal.                           - Salmon-colored mucosa suspicious for                            short-segment Barrett's esophagus. Biopsied.                           - 6 cm hiatal hernia.                           - Normal examined duodenum. Recommendation:           - Patient has a contact number available for                            emergencies. The signs and symptoms of potential                            delayed complications were discussed with the                            patient. Return to normal activities tomorrow.                            Written discharge instructions were provided to the                            patient.                           - Resume previous diet.                           - Continue present medications.                           - Await pathology results.                           - Given large hiatal hernia, it  is likiely the                            patient's cough is reflux-related.                           - Recommend discussing hiatal hernia/fundoplication                            with surgeon. [name] E. Candis Schatz, MD 10/18/2022 3:41:56 PM This report has been signed electronically.

## 2022-10-18 NOTE — Op Note (Signed)
[place] Patient Name: Courtney Donaldson Procedure Date: 10/18/2022 2:54 PM MRN: [id number] Endoscopist: Mead. Candis Schatz , MD, [id number] Age: 46 Referring MD:  Date of Birth: 1976/03/19 Gender: Female Account #: [id number] Procedure:                Colonoscopy Indications:              Screening for colorectal malignant neoplasm, This                            is the patient's first colonoscopy Medicines:                Monitored Anesthesia Care Procedure:                Pre-Anesthesia Assessment:                           - Prior to the procedure, a History and Physical                            was performed, and patient medications and                            allergies were reviewed. The patient's tolerance of                            previous anesthesia was also reviewed. The risks                            and benefits of the procedure and the sedation                            options and risks were discussed with the patient.                            All questions were answered, and informed consent                            was obtained. Prior Anticoagulants: The patient has                            taken no anticoagulant or antiplatelet agents. ASA                            Grade Assessment: II - A patient with mild systemic                            disease. After reviewing the risks and benefits,                            the patient was deemed in satisfactory condition to                            undergo the procedure.  After obtaining informed consent, the colonoscope                            was passed under direct vision. Throughout the                            procedure, the patient's blood pressure, pulse, and                            oxygen saturations were monitored continuously. The                            CF [id number] #[id number] was introduced through the anus                            and advanced to  the the terminal ileum, with                            identification of the appendiceal orifice and IC                            valve. The colonoscopy was performed without                            difficulty. The patient tolerated the procedure                            well. The quality of the bowel preparation was                            excellent. The terminal ileum, ileocecal valve,                            appendiceal orifice, and rectum were photographed.                            The bowel preparation used was SUPREP via split                            dose instruction. Scope In: 3:17:38 PM Scope Out: 3:32:13 PM Scope Withdrawal Time: 0 hours 9 minutes 58 seconds  Total Procedure Duration: 0 hours 14 minutes 35 seconds  Findings:                 The perianal and digital rectal examinations were                            normal. Pertinent negatives include normal                            sphincter tone and no palpable rectal lesions.                           A 3 mm polyp was found in the sigmoid colon. The  polyp was sessile. The polyp was removed with a                            cold snare. Resection and retrieval were complete.                            Estimated blood loss was minimal.                           The exam was otherwise normal throughout the                            examined colon.                           The terminal ileum appeared normal.                           The retroflexed view of the distal rectum and anal                            verge was normal and showed no anal or rectal                            abnormalities. Complications:            No immediate complications. Estimated Blood Loss:     Estimated blood loss was minimal. Impression:               - One 3 mm polyp in the sigmoid colon, removed with                            a cold snare. Resected and retrieved.                           - The  examined portion of the ileum was normal.                           - The distal rectum and anal verge are normal on                            retroflexion view. Recommendation:           - Patient has a contact number available for                            emergencies. The signs and symptoms of potential                            delayed complications were discussed with the                            patient. Return to normal activities tomorrow.  Written discharge instructions were provided to the                            patient.                           - Resume previous diet.                           - Continue present medications.                           - Await pathology results.                           - Repeat colonoscopy (date not yet determined) for                            surveillance based on pathology results. Torien Ramroop E. Candis Schatz, MD 10/18/2022 3:44:19 PM This report has been signed electronically.

## 2022-10-18 NOTE — Patient Instructions (Addendum)
- Patient has a contact number available for emergencies. The signs and symptoms of potential delayed complications were discussed with the patient. Return to normal activities tomorrow. Written discharge instructions were provided to the patient. - Resume previous diet. - Continue present medications. - Await pathology results. - Repeat colonoscopy (date not yet determined) for surveillance based on pathology results.. - Given large hiatal hernia, it is likiely the patient's cough is reflux-related. - Recommend discussing hiatal hernia/fundoplication with surgeon. -Handout on Hiatal hernia, and polyps  YOU HAD AN ENDOSCOPIC PROCEDURE TODAY AT [facility]:   Refer to the procedure report that was given to you for any specific questions about what was found during the examination.  If the procedure report does not answer your questions, please call your gastroenterologist to clarify.  If you requested that your care partner not be given the details of your procedure findings, then the procedure report has been included in a sealed envelope for you to review at your convenience later.  YOU SHOULD EXPECT: Some feelings of bloating in the abdomen. Passage of more gas than usual.  Walking can help get rid of the air that was put into your GI tract during the procedure and reduce the bloating. If you had a lower endoscopy (such as a colonoscopy or flexible sigmoidoscopy) you may notice spotting of blood in your stool or on the toilet paper. If you underwent a bowel prep for your procedure, you may not have a normal bowel movement for a few days.  Please Note:  You might notice some irritation and congestion in your nose or some drainage.  This is from the oxygen used during your procedure.  There is no need for concern and it should clear up in a day or so.  SYMPTOMS TO REPORT IMMEDIATELY:  Following lower endoscopy (colonoscopy or flexible sigmoidoscopy):  Excessive amounts of blood in  the stool  Significant tenderness or worsening of abdominal pains  Swelling of the abdomen that is new, acute  Fever of 100F or higher  Following upper endoscopy (EGD)  Vomiting of blood or coffee ground material  New chest pain or pain under the shoulder blades  Painful or persistently difficult swallowing  New shortness of breath  Fever of 100F or higher  Black, tarry-looking stools  For urgent or emergent issues, a gastroenterologist can be reached at any hour by calling [phone]. Do not use MyChart messaging for urgent concerns.    DIET:  We do recommend a small meal at first, but then you may proceed to your regular diet.  Drink plenty of fluids but you should avoid alcoholic beverages for 24 hours.  ACTIVITY:  You should plan to take it easy for the rest of today and you should NOT DRIVE or use heavy machinery until tomorrow (because of the sedation medicines used during the test).    FOLLOW UP: Our staff will call the number listed on your records the next business day following your procedure.  We will call around 7:15- 8:00 am to check on you and address any questions or concerns that you may have regarding the information given to you following your procedure. If we do not reach you, we will leave a message.     If any biopsies were taken you will be contacted by phone or by letter within the next 1-3 weeks.  Please call us at [phone] if you have not heard about the biopsies in 3 weeks.    SIGNATURES/CONFIDENTIALITY:  You and/or your care partner have signed paperwork which will be entered into your electronic medical record.  These signatures attest to the fact that that the information above on your After Visit Summary has been reviewed and is understood.  Full responsibility of the confidentiality of this discharge information lies with you and/or your care-partner.

## 2022-10-18 NOTE — Progress Notes (Signed)
Report to pacu rn. Vss. Care resumed by rn. 

## 2022-10-19 ENCOUNTER — Telehealth: Payer: Self-pay

## 2022-10-19 NOTE — Telephone Encounter (Signed)
-----   Message from Daryel November, MD sent at 10/19/2022  9:27 AM EST ----- Regarding: Surgery referral [facility],  Can you please place a surgery referral for consideration of hiatal hernia repair/fundoplication? thanks

## 2022-10-19 NOTE — Telephone Encounter (Signed)
Referral faxed to [facility] 

## 2022-10-19 NOTE — Telephone Encounter (Signed)
  Follow up Call-     10/18/2022    2:15 PM  Call back number  Post procedure Call Back phone  # [phone]  Permission to leave phone message Yes     Patient questions:  Do you have a fever, pain , or abdominal swelling? No. Pain Score  0 *  Have you tolerated food without any problems? Yes.    Have you been able to return to your normal activities? Yes.    Do you have any questions about your discharge instructions: Diet   No. Medications  No. Follow up visit  No.  Do you have questions or concerns about your Care? No.  Actions: * If pain score is 4 or above: No action needed, pain <4.

## 2022-10-25 NOTE — Progress Notes (Signed)
Courtney Donaldson,  The biopsies of your esophagus showed inflammatory changes related to acid reflux, but did not show any evidence of Barrett's esophagus (intestinal metaplasia).    The polyp which I removed during your recent procedure was proven to be completely benign but is considered a "pre-cancerous" polyp that MAY have grown into cancer if it had not been removed.  Studies shows that at least 20% of women over age 45 and 30% of men over age 34 have pre-cancerous polyps.  Based on current nationally recognized surveillance guidelines, I recommend that you have a repeat colonoscopy in 7 years.   Please meet with the General Surgeon to discuss possible hiatal hernia repair and fundoplication and follow up with me as needed.  If you develop any new rectal bleeding, abdominal pain or significant bowel habit changes, please contact me before then.

## 2022-10-27 NOTE — Progress Notes (Signed)
46 y.o. G0P0000 Single African American female here for annual exam.    Had left knee surgery.  She will have hiatal hernia surgery.  She is planning on weight loss.   Hot flashes resolved.   She has depression after hysterectomy.  This has resolved.   No partner change. Declines STD screening.  Asking about antibiotic to treat urinary odor that occurred in the past following intercourse.  She used prophylactic Macrobid.  She is not having post coital UTIs.  Currently not working.   PCP:   Betty Martinique, MD Sees cardiology also.   Patient's last menstrual period was 10/17/2019.           Sexually active: Yes.    The current method of family planning is status post hysterectomy.    Exercising: No.     Smoker:  no  Health Maintenance: Pap:  03/21/19 negative:Neg HR HPV, 02-22-16 Neg:Neg HR HPV  History of abnormal Pap:  no MMG:  10/09/19 Breast Density Category B, BI-RADS CATEGORY 1 Negative Colonoscopy:  10/18/22 - states polyp noted.  BMD:   n/a  Result  n/a TDaP:  07/01/20 Gardasil:   no HIV: 2019 neg Hep C: 2019 neg Screening Labs:  PCP Flu vaccine:  declined.    reports that she has never smoked. She has never used smokeless tobacco. She reports current alcohol use of about 1.0 - 2.0 standard drink of alcohol per week. She reports that she does not currently use drugs.  Past Medical History:  Diagnosis Date   Asthma    Bronchitis 10/2013   Depression    GERD (gastroesophageal reflux disease)    High blood pressure    History of COVID-19 11/25/2019   June, 2022   Hypertension    Low vitamin D level    Right ovarian cyst 2015   Calcified cyst - possible dermoid.  Needs yearly ultrasound.   UTI (urinary tract infection)     Past Surgical History:  Procedure Laterality Date   ABDOMINAL HYSTERECTOMY     ANTERIOR CRUCIATE LIGAMENT REPAIR     CYSTOSCOPY N/A 10/21/2019   Procedure: CYSTOSCOPY;  Surgeon: Nunzio Cobbs, MD;  Location: [facility];  Service: Gynecology;  Laterality: N/A;   INTRAUTERINE DEVICE (IUD) INSERTION  04/11/2013   Mirena   LAPAROSCOPIC LYSIS OF ADHESIONS N/A 10/21/2019   Procedure: LAPAROSCOPIC LYSIS OF ADHESIONS;  Surgeon: Nunzio Cobbs, MD;  Location: [facility];  Service: Gynecology;  Laterality: N/A;   MENISCUS REPAIR     TOTAL LAPAROSCOPIC HYSTERECTOMY WITH SALPINGECTOMY Bilateral 10/21/2019   Procedure: TOTAL LAPAROSCOPIC HYSTERECTOMY WITH SALPINGECTOMY AND RIGHT OOPHORECTOMY; Surgeon: Nunzio Cobbs, MD;  Location: [facility];  Service: Gynecology;  Laterality: Bilateral;  Extended recovery bed needed    Current Outpatient Medications  Medication Sig Dispense Refill   albuterol (PROVENTIL) (2.5 MG/3ML) 0.083% nebulizer solution Take 3 mLs (2.5 mg total) by nebulization every 6 (six) hours as needed for wheezing or shortness of breath. 75 mL 12   albuterol (VENTOLIN HFA) 108 (90 Base) MCG/ACT inhaler Inhale 2 puffs into the lungs every 6 (six) hours as needed for wheezing or shortness of breath. 8 g 6   amLODipine (NORVASC) 5 MG tablet TAKE 1 TABLET (5 MG TOTAL) BY MOUTH DAILY. 90 tablet 1   budesonide (PULMICORT) 0.5 MG/2ML nebulizer solution TAKE 2 ML (0.5 MG TOTAL) BY NEBULIZATION TWICE A DAY 360 mL 1   budesonide-formoterol (SYMBICORT)  160-4.5 MCG/ACT inhaler Inhale 2 puffs into the lungs 2 (two) times daily. 1 each 2   formoterol (PERFOROMIST) 20 MCG/2ML nebulizer solution Take 2 mLs (20 mcg total) by nebulization 2 (two) times daily. 120 mL 3   hydrochlorothiazide (HYDRODIURIL) 25 MG tablet TAKE 1 TABLET (25 MG TOTAL) BY MOUTH DAILY. 90 tablet 1   methocarbamol (ROBAXIN) 500 MG tablet Take 2 tablets 4 times a day by oral route.     montelukast (SINGULAIR) 10 MG tablet TAKE 1 TABLET BY MOUTH EVERYDAY AT BEDTIME 90 tablet 1   Naproxen Sodium (NAPRELAN) 750 MG TB24 Take 1 tablet by mouth as needed (pain).     ondansetron (ZOFRAN) 4 MG  tablet Take 1 tablet by mouth every 8 (eight) hours as needed.     oxyCODONE (OXY IR/ROXICODONE) 5 MG immediate release tablet Take 5 mg by mouth every 6 (six) hours as needed.     pantoprazole (PROTONIX) 40 MG tablet TAKE 1 TABLET BY MOUTH EVERY DAY 90 tablet 1   sacubitril-valsartan (ENTRESTO) 49-51 MG Take 1 tablet by mouth 2 (two) times daily. 60 tablet 11   albuterol (VENTOLIN HFA) 108 (90 Base) MCG/ACT inhaler Inhale 2 puffs into the lungs every 6 (six) hours as needed for wheezing or shortness of breath. 8 g 0   potassium chloride SA (KLOR-CON M) 20 MEQ tablet Take 1 tablet (20 mEq total) by mouth daily. (Patient not taking: Reported on 11/09/2022) 10 tablet 0   Current Facility-Administered Medications  Medication Dose Route Frequency Provider Last Rate Last Admin   0.9 %  sodium chloride infusion  500 mL Intravenous Once Daryel November, MD        Family History  Problem Relation Age of Onset   Diabetes Mother    Pancreatic cancer Paternal Aunt 52   Breast cancer Paternal Aunt    Diabetes Maternal Grandmother    Diabetes Maternal Grandfather    Cancer Maternal Grandfather    Obesity Maternal Grandfather    Breast cancer Paternal Grandmother 61       postmenopausal   Cancer Paternal Grandmother    Hypertension Father    Early death Father    Hyperlipidemia Father    Kidney disease Father    Depression Sister    Hypertension Sister    Heart attack Paternal Grandfather    Kidney failure Other 75    Review of Systems  All other systems reviewed and are negative.   Exam:   BP 120/84 (BP Location: Right Arm, Patient Position: Sitting, Cuff Size: Large)   Pulse 74   Ht '5\' 5"'$  (1.651 m)   Wt 204 lb (92.5 kg)   LMP 10/17/2019   SpO2 98%   BMI 33.95 kg/m     General appearance: alert, cooperative and appears stated age Head: normocephalic, without obvious abnormality, atraumatic Neck: no adenopathy, supple, symmetrical, trachea midline and thyroid normal to  inspection and palpation Lungs: clear to auscultation bilaterally Breasts: normal appearance, no masses or tenderness, No nipple retraction or dimpling, No nipple discharge or bleeding, No axillary adenopathy Heart: regular rate and rhythm Abdomen: soft, non-tender; no masses, no organomegaly Extremities: extremities normal, atraumatic, no cyanosis or edema Skin: skin color, texture, turgor normal. No rashes or lesions Lymph nodes: cervical, supraclavicular, and axillary nodes normal. Neurologic: grossly normal  Pelvic: External genitalia:  no lesions              No abnormal inguinal nodes palpated.  Urethra:  normal appearing urethra with no masses, tenderness or lesions              Bartholins and Skenes: normal                 Vagina: normal appearing vagina with normal color and discharge, no lesions              Cervix: absent              Pap taken: no Bimanual Exam:  Uterus:  absent              Adnexa: no mass, fullness, tenderness              Rectal exam: yes.  Confirms.              Anus:  normal sphincter tone, no lesions  Chaperone was present for exam:  Raquel Sarna  Assessment:   Well woman visit with gynecologic exam. Status post laparoscopic hysterectomy, bilateral salpingectomy, and right oophorectomy.    Left ovary remains.  Plan: Mammogram screening discussed.  Information given to patient to call the [facility] and schedule her appointment.  Self breast awareness reviewed. Pap and HR HPV as above. Guidelines for Calcium, Vitamin D, regular exercise program including cardiovascular and weight bearing exercise. I recommended against antibiotic treatment post coital unless she is having urinary tract infections.  Labs with PCP and cardiology.   Follow up annually and prn.   After visit summary provided.

## 2022-11-09 ENCOUNTER — Telehealth: Payer: Self-pay | Admitting: Gastroenterology

## 2022-11-09 ENCOUNTER — Encounter: Payer: Self-pay | Admitting: Obstetrics and Gynecology

## 2022-11-09 ENCOUNTER — Other Ambulatory Visit: Payer: Self-pay | Admitting: Obstetrics and Gynecology

## 2022-11-09 ENCOUNTER — Other Ambulatory Visit: Payer: Self-pay

## 2022-11-09 ENCOUNTER — Ambulatory Visit (INDEPENDENT_AMBULATORY_CARE_PROVIDER_SITE_OTHER): Payer: 59 | Admitting: Obstetrics and Gynecology

## 2022-11-09 VITALS — BP 120/84 | HR 74 | Ht 65.0 in | Wt 204.0 lb

## 2022-11-09 DIAGNOSIS — Z1231 Encounter for screening mammogram for malignant neoplasm of breast: Secondary | ICD-10-CM

## 2022-11-09 DIAGNOSIS — K219 Gastro-esophageal reflux disease without esophagitis: Secondary | ICD-10-CM

## 2022-11-09 DIAGNOSIS — R053 Chronic cough: Secondary | ICD-10-CM

## 2022-11-09 DIAGNOSIS — Z01419 Encounter for gynecological examination (general) (routine) without abnormal findings: Secondary | ICD-10-CM | POA: Diagnosis not present

## 2022-11-09 NOTE — Telephone Encounter (Signed)
Courtney Donaldson,  We received this referral for a esophageal manometry and pH study for a hiatal hernia acid reflux and cough.  Patient is established with Dr. Candis Schatz.  Please advise scheduling?  Thanks

## 2022-11-09 NOTE — Patient Instructions (Signed)

## 2022-11-11 ENCOUNTER — Other Ambulatory Visit: Payer: Self-pay | Admitting: Surgery

## 2022-11-11 DIAGNOSIS — K219 Gastro-esophageal reflux disease without esophagitis: Secondary | ICD-10-CM

## 2022-11-11 DIAGNOSIS — R053 Chronic cough: Secondary | ICD-10-CM

## 2022-11-25 ENCOUNTER — Ambulatory Visit
Admission: RE | Admit: 2022-11-25 | Discharge: 2022-11-25 | Disposition: A | Discharge status: Home / Self Care | Payer: 59 | Source: Ambulatory Visit | Attending: Surgery | Admitting: Surgery

## 2022-11-25 DIAGNOSIS — K219 Gastro-esophageal reflux disease without esophagitis: Secondary | ICD-10-CM

## 2022-11-25 DIAGNOSIS — R053 Chronic cough: Secondary | ICD-10-CM

## 2022-12-30 ENCOUNTER — Ambulatory Visit: Payer: 59

## 2022-12-30 ENCOUNTER — Ambulatory Visit
Admission: RE | Admit: 2022-12-30 | Discharge: 2022-12-30 | Disposition: A | Discharge status: Home / Self Care | Payer: 59 | Source: Ambulatory Visit | Attending: Obstetrics and Gynecology | Admitting: Obstetrics and Gynecology

## 2022-12-30 DIAGNOSIS — Z1231 Encounter for screening mammogram for malignant neoplasm of breast: Secondary | ICD-10-CM

## 2023-01-05 ENCOUNTER — Other Ambulatory Visit: Payer: Self-pay | Admitting: Family Medicine

## 2023-01-05 DIAGNOSIS — I10 Essential (primary) hypertension: Secondary | ICD-10-CM

## 2023-01-07 ENCOUNTER — Other Ambulatory Visit: Payer: Self-pay | Admitting: Family Medicine

## 2023-01-07 DIAGNOSIS — K219 Gastro-esophageal reflux disease without esophagitis: Secondary | ICD-10-CM

## 2023-01-10 ENCOUNTER — Other Ambulatory Visit: Payer: Self-pay | Admitting: Family Medicine

## 2023-01-10 DIAGNOSIS — I10 Essential (primary) hypertension: Secondary | ICD-10-CM

## 2023-01-11 ENCOUNTER — Ambulatory Visit (HOSPITAL_COMMUNITY)
Admission: RE | Admit: 2023-01-11 | Discharge: 2023-01-11 | Disposition: A | Discharge status: Home / Self Care | Payer: 59 | Source: Ambulatory Visit | Attending: Gastroenterology | Admitting: Gastroenterology

## 2023-01-11 ENCOUNTER — Encounter (HOSPITAL_COMMUNITY): Payer: Self-pay | Admitting: Gastroenterology

## 2023-01-11 ENCOUNTER — Encounter (HOSPITAL_COMMUNITY)
Admission: RE | Disposition: A | Discharge status: Home / Self Care | Payer: Self-pay | Source: Ambulatory Visit | Attending: Gastroenterology

## 2023-01-11 DIAGNOSIS — K219 Gastro-esophageal reflux disease without esophagitis: Secondary | ICD-10-CM | POA: Diagnosis not present

## 2023-01-11 DIAGNOSIS — R053 Chronic cough: Secondary | ICD-10-CM | POA: Diagnosis not present

## 2023-01-11 DIAGNOSIS — R112 Nausea with vomiting, unspecified: Secondary | ICD-10-CM | POA: Insufficient documentation

## 2023-01-11 HISTORY — PX: PH IMPEDANCE STUDY: SHX5565

## 2023-01-11 HISTORY — PX: 24 HOUR PH STUDY: SHX5419

## 2023-01-11 HISTORY — PX: ESOPHAGEAL MANOMETRY: SHX5429

## 2023-01-11 SURGERY — MANOMETRY, ESOPHAGUS
Anesthesia: Choice

## 2023-01-11 MED ORDER — LIDOCAINE VISCOUS HCL 2 % MT SOLN
OROMUCOSAL | Status: AC
  Filled 2023-01-11: qty 15

## 2023-01-11 SURGICAL SUPPLY — 2 items
FACESHIELD LNG OPTICON STERILE (SAFETY) IMPLANT
GLOVE BIO SURGEON STRL SZ8 (GLOVE) ×2 IMPLANT

## 2023-01-11 NOTE — Progress Notes (Signed)
Esophageal manometry performed per protocol without complications.  Patient tolerated well. pH probe placed per protocol without complications. Patient tolerated well. Education given on diary and probe.  Patient aware to return tomorrow to have probe removed.

## 2023-01-12 ENCOUNTER — Emergency Department (HOSPITAL_COMMUNITY)
Admission: EM | Admit: 2023-01-12 | Discharge: 2023-01-12 | Discharge status: Against Medical Advice | Payer: 59 | Attending: Emergency Medicine | Admitting: Emergency Medicine

## 2023-01-12 ENCOUNTER — Encounter (HOSPITAL_COMMUNITY): Payer: Self-pay

## 2023-01-12 ENCOUNTER — Other Ambulatory Visit: Payer: Self-pay

## 2023-01-12 DIAGNOSIS — R1110 Vomiting, unspecified: Secondary | ICD-10-CM | POA: Insufficient documentation

## 2023-01-12 DIAGNOSIS — Z5321 Procedure and treatment not carried out due to patient leaving prior to being seen by health care provider: Secondary | ICD-10-CM | POA: Insufficient documentation

## 2023-01-12 NOTE — ED Triage Notes (Signed)
Pt arrived complaining of EM tube coming out, pt states that she started vomiting and the tube come out of her mouth with the vomit.    Tube is currently in her nose but sticking out of her mouth

## 2023-01-13 ENCOUNTER — Encounter (HOSPITAL_COMMUNITY): Payer: Self-pay | Admitting: Gastroenterology

## 2023-01-19 ENCOUNTER — Other Ambulatory Visit: Payer: Self-pay | Admitting: Pulmonary Disease

## 2023-01-19 DIAGNOSIS — J4520 Mild intermittent asthma, uncomplicated: Secondary | ICD-10-CM

## 2023-01-30 ENCOUNTER — Telehealth (HOSPITAL_COMMUNITY): Payer: Self-pay | Admitting: Interventional Cardiology

## 2023-01-30 NOTE — Telephone Encounter (Signed)
Patient called and cancelled echocardiogram and will call back at a later date to reschedule. Order will be removed from the active echo wq and we will reinstate when patient calls back to reschedule. Thank you.

## 2023-01-30 NOTE — Telephone Encounter (Signed)
Patient called and left a voicemail to cancel echocardiogram. Patient states she will call back to reschedule. Order will be removed from the active echo WQ and when pt calls back we will reinstate the order. Thank you.

## 2023-01-31 NOTE — Progress Notes (Signed)
Courtney Donaldson,  Your pH/impedance test showed that you have significant acid reflux, and that your symptoms correlate well with reflux events.  If your symptoms are not adequately controlled with acid suppressing medications and anti-reflux dietary measures and behavioral modifications, I would recommend you consider undergoing anti-reflux surgery (fundoplication) and hiatal hernia repair.  Your esophageal manometry was unremarkable and did not demonstrate any significant motility problems of the esophagus.    Would you like [place] to place a surgery referral for you?

## 2023-02-01 ENCOUNTER — Ambulatory Visit (HOSPITAL_COMMUNITY): Payer: 59

## 2023-02-08 ENCOUNTER — Other Ambulatory Visit (HOSPITAL_BASED_OUTPATIENT_CLINIC_OR_DEPARTMENT_OTHER): Payer: 59

## 2023-02-16 ENCOUNTER — Ambulatory Visit: Payer: Self-pay | Admitting: Surgery

## 2023-02-16 ENCOUNTER — Telehealth (HOSPITAL_BASED_OUTPATIENT_CLINIC_OR_DEPARTMENT_OTHER): Payer: Self-pay
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
## 2023-02-16 NOTE — Telephone Encounter (Signed)
   Pre-operative Risk Assessment    Patient Name: Courtney Donaldson  DOB: 19-Mar-1976 MRN: [id number]      Request for Surgical Clearance    Procedure:   Hernia Repair  Date of Surgery:  Clearance TBD                                 Surgeon:  Twana First, MD Surgeon's Group or Practice Name:  [facility] Phone number:  [phone] Fax number:  [phone] Kathie Rhodes Roe Coombs, CMA)   Type of Clearance Requested:   - Medical    Type of Anesthesia:  General    Additional requests/questions:   None  Lorella Nimrod   02/16/2023, 10:45 AM

## 2023-02-16 NOTE — Telephone Encounter (Signed)
Primary Cardiologist:Henry Malissa Hippo III, MD (Inactive)  Chart reviewed as part of pre-operative protocol coverage. Because of Courtney Donaldson's past medical history and time since last visit, he/she will require a follow-up visit in order to better assess preoperative cardiovascular risk.  Pre-op covering staff: - Patient has an appointment 03/01/23 with Dr. Duke Salvia at which time clearance can be addressed. She cancelled the echocardiogram that was ordered by Dr. Katrinka Blazing 09/12/22, therefore she will need to have office visit prior to completion of requested clearance.  - Please contact requesting surgeon's office via preferred method (i.e, phone, fax) to inform them of need for appointment prior to surgery.  No request to hold cardiac medications.   Levi Aland, NP-C  02/16/2023, 12:15 PM [address], [address] Office [phone] Fax [phone]

## 2023-02-16 NOTE — Telephone Encounter (Signed)
Pt has appt 03/01/23 with Dr. Duke Salvia. Will add need pre op clearance to appt notes. I will send FYI to surgeon office pt has appt with her cardiologist. Once Dr. Duke Salvia clears the pt she will have her nurse/cma fax notes giving clearance and any medication recommendations.

## 2023-03-01 ENCOUNTER — Ambulatory Visit (HOSPITAL_BASED_OUTPATIENT_CLINIC_OR_DEPARTMENT_OTHER): Payer: 59 | Admitting: Cardiovascular Disease

## 2023-03-10 ENCOUNTER — Telehealth: Payer: Self-pay

## 2023-03-10 ENCOUNTER — Ambulatory Visit (INDEPENDENT_AMBULATORY_CARE_PROVIDER_SITE_OTHER): Payer: Medicaid Other

## 2023-03-10 DIAGNOSIS — Z111 Encounter for screening for respiratory tuberculosis: Secondary | ICD-10-CM

## 2023-03-10 NOTE — Telephone Encounter (Signed)
Patient is needing a TB test and has been seen within the past year. Per PCP, okay to administer.

## 2023-03-10 NOTE — Progress Notes (Signed)
PPD Placement note Courtney Donaldson, 47 y.o. female is here today for placement of PPD test Reason for PPD test: Work Pt taken PPD test before: no Verified in allergy area and with patient that they are not allergic to the products PPD is made of (Phenol or Tween). Yes Is patient taking any oral or IV steroid medication now or have they taken it in the last month? no Has the patient ever received the BCG vaccine?: no Has the patient been in recent contact with anyone known or suspected of having active TB disease?: no      Date of exposure (if applicable): N/A      Name of person they were exposed to (if applicable): N/A Patient's Country of origin?: [place] states O: Alert and oriented in NAD. P:  PPD placed on 03/10/2023.  Patient advised to return for reading within 48-72 hours.

## 2023-03-13 LAB — TB SKIN TEST
Induration: 0 mm
TB Skin Test: NEGATIVE

## 2023-03-13 NOTE — Progress Notes (Signed)
PPD Reading Note  PPD read and results entered in EpicCare.  Result: 0 mm induration.  Interpretation: Negative  If test not read within 48-72 hours of initial placement, patient advised to repeat in other arm 1-3 weeks after this test.  Allergic reaction: no

## 2023-03-17 NOTE — Telephone Encounter (Signed)
Pt will need to call the office to schedule an appt in office for pre op. Pt did not show for her appt with Dr. Duke Salvia, 03/01/23. I will update the requesting office.

## 2023-07-06 ENCOUNTER — Other Ambulatory Visit: Payer: Self-pay | Admitting: Family Medicine

## 2023-07-06 DIAGNOSIS — I10 Essential (primary) hypertension: Secondary | ICD-10-CM

## 2023-07-06 DIAGNOSIS — K219 Gastro-esophageal reflux disease without esophagitis: Secondary | ICD-10-CM

## 2023-10-07 ENCOUNTER — Other Ambulatory Visit: Payer: Self-pay | Admitting: Family Medicine

## 2023-10-07 DIAGNOSIS — K219 Gastro-esophageal reflux disease without esophagitis: Secondary | ICD-10-CM

## 2023-10-07 DIAGNOSIS — I10 Essential (primary) hypertension: Secondary | ICD-10-CM
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
## 2024-06-03 ENCOUNTER — Telehealth: Payer: Self-pay | Admitting: Pulmonary Disease

## 2024-06-03 NOTE — Telephone Encounter (Signed)
 Faxed back CMN from [facility] due to Pt has not been seen within the past year. LOV: 07/20/2022 and Pt will need to follow-up  Routing to assisting with scheduling and offer Pt an appt. Can you all please assist?

## 2024-06-03 NOTE — Telephone Encounter (Signed)
 Patient has been scheduled for September with Dr.Hunsucker.

## 2024-07-18 ENCOUNTER — Ambulatory Visit (INDEPENDENT_AMBULATORY_CARE_PROVIDER_SITE_OTHER): Admitting: Pulmonary Disease

## 2024-07-18 ENCOUNTER — Encounter: Payer: Self-pay | Admitting: Pulmonary Disease

## 2024-07-18 DIAGNOSIS — R0602 Shortness of breath: Secondary | ICD-10-CM

## 2024-07-18 DIAGNOSIS — J45909 Unspecified asthma, uncomplicated: Secondary | ICD-10-CM

## 2024-07-18 DIAGNOSIS — R053 Chronic cough: Secondary | ICD-10-CM

## 2024-07-18 MED ORDER — ALBUTEROL SULFATE HFA 108 (90 BASE) MCG/ACT IN AERS
2.0000 | INHALATION_SPRAY | Freq: Four times a day (QID) | RESPIRATORY_TRACT | 12 refills | Status: AC | PRN
Start: 2024-07-18 — End: 2024-10-16
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
## 2024-09-03 ENCOUNTER — Ambulatory Visit: Admitting: Physician Assistant

## 2024-09-03 LAB — AMB RESULTS CONSOLE CBG: Glucose: 118

## 2024-09-03 NOTE — Progress Notes (Signed)
 SDOH Reviewed   Patient was told to visit our [facility]U to be seen for hypertension

## 2024-10-15 NOTE — Progress Notes (Unsigned)
 The patient attended a screening event on 09/03/2024 where her BP screening results was 141/101. At the event the patient noted she has insurance and does not smoke. Patient did not have any SDOH insecurities. Pt listed pcp as Dr. Betty G Jordan, MD. At the screening event pt was told to visit the MMU to be seen for hypertension and advised to contact pcp about BP. Per chart review pt was last seen by pcp on 09/13/2022 for coughing on a video visit. There are no past or future visits within the past 12 months for pt. According to chart pt is on a strict diet control and becoming more physically active. Chart review also indicates a future appt on 03/28/24. No additional Health equity team support indicated at this time.  Waiting on insurance [employer] to quick in for the new and than the pt will schedule an appt with her provider. Pt stated she is currently taking her blood pressure medication and meniroting her blood pressure at home in the mine time.
